# Patient Record
Sex: Female | Born: 1990
Health system: Southern US, Community
[De-identification: ages and names within clinical notes are randomized; demographics above are authoritative.]

## PROBLEM LIST (undated history)

## (undated) ENCOUNTER — Inpatient Hospital Stay (HOSPITAL_COMMUNITY): Payer: Medicaid Other

## (undated) DIAGNOSIS — R519 Headache, unspecified: Secondary | ICD-10-CM

## (undated) DIAGNOSIS — K219 Gastro-esophageal reflux disease without esophagitis: Secondary | ICD-10-CM

## (undated) DIAGNOSIS — F329 Major depressive disorder, single episode, unspecified: Secondary | ICD-10-CM

## (undated) DIAGNOSIS — F32A Depression, unspecified: Secondary | ICD-10-CM

## (undated) DIAGNOSIS — Z5189 Encounter for other specified aftercare: Secondary | ICD-10-CM

## (undated) DIAGNOSIS — F419 Anxiety disorder, unspecified: Secondary | ICD-10-CM

## (undated) DIAGNOSIS — R87629 Unspecified abnormal cytological findings in specimens from vagina: Secondary | ICD-10-CM

## (undated) DIAGNOSIS — D649 Anemia, unspecified: Secondary | ICD-10-CM

## (undated) DIAGNOSIS — O139 Gestational [pregnancy-induced] hypertension without significant proteinuria, unspecified trimester: Secondary | ICD-10-CM

## (undated) HISTORY — DX: Depression, unspecified: F32.A

## (undated) HISTORY — DX: Unspecified abnormal cytological findings in specimens from vagina: R87.629

## (undated) HISTORY — DX: Anxiety disorder, unspecified: F41.9

## (undated) HISTORY — PX: TONSILLECTOMY: SUR1361

## (undated) HISTORY — DX: Headache, unspecified: R51.9

## (undated) HISTORY — DX: Encounter for other specified aftercare: Z51.89

## (undated) HISTORY — DX: Gestational (pregnancy-induced) hypertension without significant proteinuria, unspecified trimester: O13.9

---
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
## 2010-03-22 ENCOUNTER — Emergency Department (HOSPITAL_COMMUNITY): Admission: EM | Admit: 2010-03-22 | Discharge: 2010-03-22 | Payer: Self-pay | Admitting: Emergency Medicine

## 2010-04-23 ENCOUNTER — Emergency Department (HOSPITAL_COMMUNITY): Admission: EM | Admit: 2010-04-23 | Discharge: 2010-04-23 | Payer: Self-pay | Admitting: Emergency Medicine

## 2010-09-16 LAB — URINALYSIS, ROUTINE W REFLEX MICROSCOPIC
Bilirubin Urine: NEGATIVE
Glucose, UA: NEGATIVE mg/dL
Hgb urine dipstick: NEGATIVE
Ketones, ur: NEGATIVE mg/dL
Protein, ur: NEGATIVE mg/dL
Specific Gravity, Urine: 1.019 (ref 1.005–1.030)
Urobilinogen, UA: 1 mg/dL (ref 0.0–1.0)

## 2010-09-16 LAB — WET PREP, GENITAL
Trich, Wet Prep: NONE SEEN
Yeast Wet Prep HPF POC: NONE SEEN

## 2010-09-16 LAB — URINE MICROSCOPIC-ADD ON

## 2010-09-22 LAB — POCT I-STAT, CHEM 8
Creatinine, Ser: 0.5 mg/dL (ref 0.4–1.2)
Glucose, Bld: 87 mg/dL (ref 70–99)
HCT: 33 % — ABNORMAL LOW (ref 36.0–46.0)
Hemoglobin: 11.2 g/dL — ABNORMAL LOW (ref 12.0–15.0)
Potassium: 4 mEq/L (ref 3.5–5.1)
TCO2: 26 mmol/L (ref 0–100)

## 2010-09-22 LAB — URINALYSIS, ROUTINE W REFLEX MICROSCOPIC
Bilirubin Urine: NEGATIVE
Nitrite: NEGATIVE
Urobilinogen, UA: 1 mg/dL (ref 0.0–1.0)

## 2010-11-19 NOTE — Op Note (Signed)
Carmen Price, Carmen Price                        ACCOUNT NO.:  [id number]   MEDICAL RECORD NO.:  [id number]                   PATIENT TYPE:  AMB   LOCATION:  DSC                                  FACILITY:  [facility]   PHYSICIAN:  Kinnie Scales. Annalee Genta, M.D.            DATE OF BIRTH:  11-15-1990   DATE OF PROCEDURE:  DATE OF DISCHARGE:                                 OPERATIVE REPORT   PREOPERATIVE DIAGNOSIS:  1. Recurrent acute tonsillitis.  2. Adenotonsillar hypertrophy.   POSTOPERATIVE DIAGNOSIS:  1. Recurrent acute tonsillitis.  2. Adenotonsillar hypertrophy.   PROCEDURE:  Tonsillectomy and adenoidectomy (adenoid ablation).   SURGEON:  Kinnie Scales. Annalee Genta, M.D.   ANESTHESIA:  General endotracheal.   COMPLICATIONS:  None.   ESTIMATED BLOOD LOSS:  Minimal.   DISPOSITION:  The patient was transferred from the operating room to the  recovery room in stable condition.   INDICATIONS:  The patient is an 20 year old black female who is referred by  her pediatrician, Dr. Vaughan Basta, at [facility], for evaluation of  recurrent tonsillitis and adenotonsillar hypertrophy.  The patient had a  history of recurrent infections with multiple courses of antibiotics and  significant adenotonsillar hypertrophy with snoring and possible obstructive  sleep apnea.  Given the patient's history and examination which showed  significant airway obstruction from tonsillar hypertrophy, I recommend that  we consider her for tonsillectomy and adenoidectomy under general  anesthesia.  The risks, benefits and possible complications of the procedure  were discussed in detail with the patient's mother who understood and  concurred with our plan for surgery, which was scheduled for March 17, 2003.   DESCRIPTION OF PROCEDURE:  The patient was brought to the operating room on  March 17, 2003, and placed in the supine position on the operating  table.  General endotracheal anesthesia was established  without difficulty.  When the patient was adequately anesthetized, a Crowe-Davis mouth gag was  inserted without difficulty.  The oral cavity and oropharynx were examined.  There were no loose or broken teeth and the hard and soft palate were  intact.  The patient's posterior nasopharynx was examined.  She was found to  have moderately enlarged adenoids and adenoid ablation was undertaken using  Bovie electrocautery.  With the adenoid tissue ablated, the nasopharynx was  patent.  Attention was then turned to the patient's tonsils.  Again, on the  left hand side using the harmonic scalpel and dissecting in a subcapsular  fashion, the entire left tonsil was removed from the superior pole of the  tongue base.  The right tonsil was removed in a similar fashion, and  tonsillar tissue was sent to pathology for gross and microscopic evaluation.  Tonsillar fossa was gently abraded with a dry tonsil sponge.  There were  several areas of point hemorrhage which were cauterized using suction  cautery.  The Crowe-Davis mouth gag was released and reapplied.  There  was  no active bleeding.  The patient's oral cavity, oral pharynx and nasal  cavity and nasopharynx were then irrigated with saline and suctioned.  An  oral gastric tube was passed and the stomach contents were aspirated.  The  patient was then awakened from her anesthetic.  The Crowe-Davis mouth gag  was released and removed, and there were no loose or broken teeth and no  active bleeding.  The patient was then extubated and transferred from the  operating room to the recovery room in stable condition.                                               Kinnie Scales. Annalee Genta, M.D.    DLS/MEDQ  D:  81/19/1478  T:  03/17/2003  Job:  [id number]

## 2011-02-15 ENCOUNTER — Emergency Department (HOSPITAL_COMMUNITY)
Admission: EM | Admit: 2011-02-15 | Discharge: 2011-02-16 | Disposition: A | Discharge status: Home / Self Care | Payer: Medicaid Other | Attending: Emergency Medicine | Admitting: Emergency Medicine

## 2011-02-15 DIAGNOSIS — R112 Nausea with vomiting, unspecified: Secondary | ICD-10-CM | POA: Insufficient documentation

## 2011-02-15 DIAGNOSIS — IMO0001 Reserved for inherently not codable concepts without codable children: Secondary | ICD-10-CM | POA: Insufficient documentation

## 2011-02-15 DIAGNOSIS — B9789 Other viral agents as the cause of diseases classified elsewhere: Secondary | ICD-10-CM | POA: Insufficient documentation

## 2011-02-15 DIAGNOSIS — R000 Tachycardia, unspecified: Secondary | ICD-10-CM | POA: Insufficient documentation

## 2011-02-16 LAB — DIFFERENTIAL
Basophils Relative: 0 % (ref 0–1)
Eosinophils Relative: 1 % (ref 0–5)
Lymphs Abs: 1 10*3/uL (ref 0.7–4.0)
Monocytes Relative: 6 % (ref 3–12)
Neutro Abs: 6.6 10*3/uL (ref 1.7–7.7)

## 2011-02-16 LAB — COMPREHENSIVE METABOLIC PANEL
ALT: 13 U/L (ref 0–35)
Albumin: 4 g/dL (ref 3.5–5.2)
Alkaline Phosphatase: 64 U/L (ref 39–117)
Calcium: 9.2 mg/dL (ref 8.4–10.5)
Glucose, Bld: 102 mg/dL — ABNORMAL HIGH (ref 70–99)
Sodium: 139 mEq/L (ref 135–145)
Total Protein: 7.8 g/dL (ref 6.0–8.3)

## 2011-02-16 LAB — URINALYSIS, ROUTINE W REFLEX MICROSCOPIC
Glucose, UA: NEGATIVE mg/dL
Urobilinogen, UA: 1 mg/dL (ref 0.0–1.0)
pH: 7.5 (ref 5.0–8.0)

## 2011-02-16 LAB — LIPASE, BLOOD: Lipase: 17 U/L (ref 11–59)

## 2011-02-16 LAB — CBC
Hemoglobin: 13.1 g/dL (ref 12.0–15.0)
MCHC: 34.8 g/dL (ref 30.0–36.0)
RBC: 4.23 MIL/uL (ref 3.87–5.11)

## 2011-03-29 LAB — URINALYSIS, ROUTINE W REFLEX MICROSCOPIC
Glucose, UA: NEGATIVE
Ketones, ur: 15 — AB
Leukocytes, UA: NEGATIVE
Protein, ur: >300 — AB
Specific Gravity, Urine: 1.03
Urobilinogen, UA: 0.2
pH: 5

## 2011-03-29 LAB — URINE MICROSCOPIC-ADD ON

## 2011-04-05 ENCOUNTER — Other Ambulatory Visit: Payer: Self-pay | Admitting: Family Medicine

## 2011-04-05 ENCOUNTER — Other Ambulatory Visit (HOSPITAL_COMMUNITY)
Admission: RE | Admit: 2011-04-05 | Discharge: 2011-04-05 | Disposition: A | Discharge status: Home / Self Care | Payer: Medicaid Other | Source: Ambulatory Visit | Attending: Family Medicine | Admitting: Family Medicine

## 2011-04-05 DIAGNOSIS — Z01419 Encounter for gynecological examination (general) (routine) without abnormal findings: Secondary | ICD-10-CM | POA: Insufficient documentation

## 2011-04-05 DIAGNOSIS — R87810 Cervical high risk human papillomavirus (HPV) DNA test positive: Secondary | ICD-10-CM | POA: Insufficient documentation

## 2011-08-20 ENCOUNTER — Inpatient Hospital Stay (HOSPITAL_COMMUNITY)
Admission: AD | Admit: 2011-08-20 | Discharge: 2011-08-20 | Disposition: A | Discharge status: Home / Self Care | Payer: Medicaid Other | Source: Ambulatory Visit | Attending: Obstetrics & Gynecology | Admitting: Obstetrics & Gynecology

## 2011-08-20 DIAGNOSIS — Z3202 Encounter for pregnancy test, result negative: Secondary | ICD-10-CM

## 2011-08-20 DIAGNOSIS — R110 Nausea: Secondary | ICD-10-CM | POA: Insufficient documentation

## 2011-08-20 NOTE — ED Provider Notes (Signed)
Attestation of Attending Supervision of Advanced Practitioner: Evaluation and management procedures were performed by the PA/NP/CNM/OB Fellow under my supervision/collaboration. Chart reviewed, and agree with management and plan.  Jaynie Collins, M.D. 08/20/2011 10:42 AM

## 2011-08-20 NOTE — ED Notes (Signed)
T.Burleson,NP notified pt pregnancy test is negative but it has not been enough time inbetween periods to know for sure. Recommended to test in a few more weeks if she misses her period.

## 2011-08-20 NOTE — Progress Notes (Signed)
Has been nauseated past few days wanted to check to see if she was pregnant.

## 2011-08-20 NOTE — ED Provider Notes (Signed)
History     No chief complaint on file.  HPI [name] 21 y.o. LMP 08-08-11, having some nausea and came for pregnancy test.  Uses condoms with sex and is unsure if any have broken.  Denies having any other problems today.  Does not want an exam.  Simply wants a pregnancy test.   OB History    No data available      No past medical history on file.  No past surgical history on file.  No family history on file.  History  Substance Use Topics  . Smoking status: Not on file  . Smokeless tobacco: Not on file  . Alcohol Use: Not on file    Allergies: No Known Allergies  No prescriptions prior to admission    ROS Physical Exam   Last menstrual period 08/08/2011.  Physical Exam  Nursing note and vitals reviewed. Constitutional: She is oriented to person, place, and time. She appears well-developed and well-nourished.  HENT:  Head: Normocephalic.  Eyes: EOM are normal.  Neck: Neck supple.  Musculoskeletal: Normal range of motion.  Neurological: She is alert and oriented to person, place, and time.  Skin: Skin is warm and dry.  Psychiatric: She has a normal mood and affect.    MAU Course  Procedures  MDM Results for orders placed during the hospital encounter of 08/20/11 (from the past 24 hour(s))  POCT PREGNANCY, URINE     Status: Normal   Collection Time   08/20/11 10:08 AM      Component Value Range   Preg Test, Ur NEGATIVE  NEGATIVE     Assessment and Plan  Negative pregnancy test  Plan Continue condoms If period is late, repeat pregnancy test. [name] 08/20/2011, 10:26 AM   [name], NP 08/20/11 1029

## 2012-07-01 ENCOUNTER — Emergency Department (HOSPITAL_COMMUNITY)
Admission: EM | Admit: 2012-07-01 | Discharge: 2012-07-01 | Disposition: A | Discharge status: Home / Self Care | Payer: Self-pay | Attending: Emergency Medicine | Admitting: Emergency Medicine

## 2012-07-01 ENCOUNTER — Encounter (HOSPITAL_COMMUNITY): Payer: Self-pay | Admitting: Family Medicine

## 2012-07-01 DIAGNOSIS — R5381 Other malaise: Secondary | ICD-10-CM | POA: Insufficient documentation

## 2012-07-01 DIAGNOSIS — R05 Cough: Secondary | ICD-10-CM | POA: Insufficient documentation

## 2012-07-01 DIAGNOSIS — R112 Nausea with vomiting, unspecified: Secondary | ICD-10-CM | POA: Insufficient documentation

## 2012-07-01 DIAGNOSIS — R630 Anorexia: Secondary | ICD-10-CM | POA: Insufficient documentation

## 2012-07-01 DIAGNOSIS — B9789 Other viral agents as the cause of diseases classified elsewhere: Secondary | ICD-10-CM | POA: Insufficient documentation

## 2012-07-01 DIAGNOSIS — Z3202 Encounter for pregnancy test, result negative: Secondary | ICD-10-CM | POA: Insufficient documentation

## 2012-07-01 DIAGNOSIS — F17200 Nicotine dependence, unspecified, uncomplicated: Secondary | ICD-10-CM | POA: Insufficient documentation

## 2012-07-01 DIAGNOSIS — R6883 Chills (without fever): Secondary | ICD-10-CM | POA: Insufficient documentation

## 2012-07-01 DIAGNOSIS — B349 Viral infection, unspecified: Secondary | ICD-10-CM

## 2012-07-01 DIAGNOSIS — R1110 Vomiting, unspecified: Secondary | ICD-10-CM

## 2012-07-01 DIAGNOSIS — H109 Unspecified conjunctivitis: Secondary | ICD-10-CM | POA: Insufficient documentation

## 2012-07-01 DIAGNOSIS — R059 Cough, unspecified: Secondary | ICD-10-CM | POA: Insufficient documentation

## 2012-07-01 DIAGNOSIS — R0982 Postnasal drip: Secondary | ICD-10-CM | POA: Insufficient documentation

## 2012-07-01 LAB — POCT PREGNANCY, URINE: Preg Test, Ur: NEGATIVE

## 2012-07-01 LAB — URINE MICROSCOPIC-ADD ON

## 2012-07-01 LAB — URINALYSIS, ROUTINE W REFLEX MICROSCOPIC
Glucose, UA: NEGATIVE mg/dL
Hgb urine dipstick: NEGATIVE
Specific Gravity, Urine: 1.021 (ref 1.005–1.030)
Urobilinogen, UA: 1 mg/dL (ref 0.0–1.0)

## 2012-07-01 MED ORDER — PROMETHAZINE HCL 25 MG PO TABS: 25.0000 mg | ORAL_TABLET | Freq: Four times a day (QID) | ORAL | Status: DC | PRN

## 2012-07-01 MED ORDER — ONDANSETRON 4 MG PO TBDP: 4.0000 mg | ORAL_TABLET | Freq: Three times a day (TID) | ORAL | Status: DC | PRN

## 2012-07-01 MED ORDER — ONDANSETRON 4 MG PO TBDP
4.0000 mg | ORAL_TABLET | Freq: Once | ORAL | Status: AC
  Administered 2012-07-01: 4 mg via ORAL
  Filled 2012-07-01: qty 1

## 2012-07-01 NOTE — ED Notes (Signed)
Per pt sts she vomited twice this am and she thinks she has pink eye. Denies abdominal pain. sts some nausea.

## 2012-07-01 NOTE — ED Provider Notes (Signed)
History     CSN: [id number]  Arrival date & time 07/01/12  1424   First MD Initiated Contact with Patient 07/01/12 1700      Chief Complaint  Patient presents with  . Vomiting  . Conjunctivitis    (Consider location/radiation/quality/duration/timing/severity/associated sxs/prior treatment) HPI Comments: Ms. Deschene presents ambulatory for evaluation of a nausea and vomiting.  She awoke this morning and vomited once.  She reports the emesis appeared to be the same as the meal she ate prior to going to bed.  While in the waiting room, she drank water and vomited again.  She denies any pain but reports that she has had a cough and runny nose for a bout 1 week.  She states she awoke with red eyes and mucous like crusting similar to her nasal discharge this morning and yesterday.  She is concerned she may have "pink eye".  She denies any exposure to conjunctivitis, eye itching, or visual changes.  She also denies sore throat or any productivity to the cough.  Patient is a 21 y.o. female presenting with vomiting. The history is provided by the patient. No language interpreter was used.  Emesis  This is a new problem. The current episode started 6 to 12 hours ago. Episode frequency: 2 times total. The problem has been gradually improving. The emesis has an appearance of stomach contents. There has been no fever. Associated symptoms include chills, cough, sweats and URI. Pertinent negatives include no abdominal pain, no arthralgias, no diarrhea, no fever, no headaches and no myalgias.    History reviewed. No pertinent past medical history.  History reviewed. No pertinent past surgical history.  History reviewed. No pertinent family history.  History  Substance Use Topics  . Smoking status: Current Every Day Smoker  . Smokeless tobacco: Not on file  . Alcohol Use: Yes    OB History    Grav Para Term Preterm Abortions TAB SAB Ect Mult Living                  Review of Systems    Constitutional: Positive for chills, appetite change and fatigue. Negative for fever, diaphoresis and activity change.  HENT: Positive for postnasal drip. Negative for nosebleeds, trouble swallowing, neck stiffness and sinus pressure.   Respiratory: Positive for cough. Negative for chest tightness and shortness of breath.   Cardiovascular: Negative for chest pain, palpitations and leg swelling.  Gastrointestinal: Positive for vomiting. Negative for abdominal pain and diarrhea.  Genitourinary: Negative for dysuria, urgency, frequency, hematuria, flank pain, vaginal discharge, difficulty urinating and pelvic pain.  Musculoskeletal: Negative for myalgias and arthralgias.  Neurological: Negative for syncope, light-headedness and headaches.  Hematological: Negative.     Allergies  Review of patient's allergies indicates no known allergies.  Home Medications  No current outpatient prescriptions on file.  BP 138/63  Pulse 106  Temp 99.2 F (37.3 C) (Oral)  Resp 18  SpO2 97%  LMP 06/09/2012  Physical Exam  Nursing note and vitals reviewed. Constitutional: She is oriented to person, place, and time. She appears well-developed and well-nourished. No distress. She is not intubated.  HENT:  Head: Normocephalic and atraumatic. Head is without raccoon's eyes, without Battle's sign, without contusion, without laceration, without right periorbital erythema and without left periorbital erythema. Hair is normal. No trismus in the jaw.  Right Ear: Hearing, tympanic membrane and ear canal normal. No drainage, swelling or tenderness. No mastoid tenderness. Tympanic membrane is not injected and not erythematous. No hemotympanum.  Left  Ear: Hearing, tympanic membrane, external ear and ear canal normal. No drainage, swelling or tenderness. No mastoid tenderness. Tympanic membrane is not injected and not erythematous. No hemotympanum.  Nose: Mucosal edema and rhinorrhea present. No nose lacerations, sinus  tenderness or nasal deformity. No epistaxis.  No foreign bodies. Right sinus exhibits no maxillary sinus tenderness and no frontal sinus tenderness. Left sinus exhibits no maxillary sinus tenderness and no frontal sinus tenderness.  Mouth/Throat: Uvula is midline, oropharynx is clear and moist and mucous membranes are normal. Mucous membranes are not pale, not dry and not cyanotic. No oral lesions. Dental caries present. No uvula swelling or lacerations. No oropharyngeal exudate, posterior oropharyngeal edema, posterior oropharyngeal erythema or tonsillar abscesses.  Eyes: Conjunctivae normal, EOM and lids are normal. Pupils are equal, round, and reactive to light. Right eye exhibits no chemosis, no discharge, no exudate and no hordeolum. Left eye exhibits no chemosis, no discharge, no exudate and no hordeolum. Right conjunctiva is not injected. Right conjunctiva has no hemorrhage. Left conjunctiva is not injected. Left conjunctiva has no hemorrhage. No scleral icterus. Right eye exhibits no nystagmus. Left eye exhibits normal extraocular motion and no nystagmus. Right pupil is round and reactive. Left pupil is round and reactive. Pupils are equal.  Neck: Trachea normal, normal range of motion, full passive range of motion without pain and phonation normal. Neck supple. No JVD present. No tracheal tenderness, no spinous process tenderness and no muscular tenderness present. Carotid bruit is not present. No rigidity. No tracheal deviation, no edema, no erythema and normal range of motion present.  Cardiovascular: Normal rate, regular rhythm, S1 normal, S2 normal, normal heart sounds and intact distal pulses.   No extrasystoles are present. PMI is not displaced.  Exam reveals no gallop, no distant heart sounds, no friction rub and no decreased pulses.   No murmur heard. Pulmonary/Chest: Effort normal and breath sounds normal. No accessory muscle usage or stridor. No apnea, not tachypneic and not bradypneic. She  is not intubated. No respiratory distress. She has no decreased breath sounds. She has no wheezes. She has no rhonchi. She has no rales. She exhibits no tenderness, no bony tenderness, no crepitus, no edema and no retraction.  Abdominal: Soft. Normal appearance and bowel sounds are normal. She exhibits shifting dullness. She exhibits no distension, no ascites, no pulsatile midline mass and no mass. There is no hepatosplenomegaly. There is no tenderness. There is no rigidity, no rebound, no guarding, no CVA tenderness, no tenderness at McBurney's point and negative Murphy's sign. No hernia.  Musculoskeletal: Normal range of motion. She exhibits no edema and no tenderness.  Lymphadenopathy:    She has no cervical adenopathy.  Neurological: She is oriented to person, place, and time. No cranial nerve deficit.  Skin: Skin is warm and dry. No rash noted. She is not diaphoretic. No erythema.  Psychiatric: She has a normal mood and affect. Her behavior is normal.    ED Course  Procedures (including critical care time)  Labs Reviewed  URINALYSIS, ROUTINE W REFLEX MICROSCOPIC - Abnormal; Notable for the following:    Leukocytes, UA SMALL (*)     All other components within normal limits  URINE MICROSCOPIC-ADD ON - Abnormal; Notable for the following:    Squamous Epithelial / LPF MANY (*)     Bacteria, UA FEW (*)     All other components within normal limits  POCT PREGNANCY, URINE  URINE CULTURE   No results found.   No diagnosis found.  MDM  Pt presents for evaluation of nausea and vomiting.  She appears comfortable and nontoxic, note mildly elevated HR on triage VS, NAD.  She was concerned that she may have conjunctivitis however she has clear sclera/conjunctiva without discharge.  Urinalysis performed in triage is not consistent with a UTI and the urine pregnancy test is negative.  Her abdomen is soft with normal vital signs.  Suspect the vomiting may be secondary to a viral process as she  also has rhinorrhea, chills, anda nonproductive cough.  Will administer zofran an attempt a po challenge.  If she remains stable, will eventually discharge home with symptomatic care. 1915.  Pt stable, NAD.  She has tolerated po clears.  Plan discharge home.  Will prescribe zofran and phenergan to be taken as needed for nausea.     Tobin Chad, MD 07/01/12 [phone]

## 2012-07-03 LAB — URINE CULTURE

## 2012-07-04 NOTE — ED Notes (Signed)
+   Urine Chart sent to EDP office for review. 

## 2012-08-07 ENCOUNTER — Inpatient Hospital Stay (HOSPITAL_COMMUNITY)
Admission: AD | Admit: 2012-08-07 | Discharge: 2012-08-07 | Disposition: A | Discharge status: Home / Self Care | Payer: Medicaid Other | Source: Ambulatory Visit | Attending: Family Medicine | Admitting: Family Medicine

## 2012-08-07 DIAGNOSIS — Z3202 Encounter for pregnancy test, result negative: Secondary | ICD-10-CM | POA: Insufficient documentation

## 2012-08-07 DIAGNOSIS — N912 Amenorrhea, unspecified: Secondary | ICD-10-CM | POA: Insufficient documentation

## 2012-08-07 DIAGNOSIS — N926 Irregular menstruation, unspecified: Secondary | ICD-10-CM | POA: Insufficient documentation

## 2012-08-07 LAB — POCT PREGNANCY, URINE: Preg Test, Ur: NEGATIVE

## 2012-08-07 NOTE — MAU Provider Note (Signed)
  History     CSN: [id number]  Arrival date and time: 08/07/12 1422   First Provider Initiated Contact with Patient 08/07/12 1552      Chief Complaint  Patient presents with  . Possible Pregnancy   HPI [name] is a 22 y.o. female who presents to MAU today for a pregnancy test. The patient states LMP was 06/03/12. She denies any vaginal bleeding, abnormal discharge, abdominal pain, fever, N/V. She has a history of irregular periods.   OB History    Grav Para Term Preterm Abortions TAB SAB Ect Mult Living                  No past medical history on file.  No past surgical history on file.  No family history on file.  History  Substance Use Topics  . Smoking status: Current Every Day Smoker  . Smokeless tobacco: Not on file  . Alcohol Use: Yes    Allergies: No Known Allergies  Prescriptions prior to admission  Medication Sig Dispense Refill  . ondansetron (ZOFRAN ODT) 4 MG disintegrating tablet Take 1 tablet (4 mg total) by mouth every 8 (eight) hours as needed for nausea.  10 tablet  0  . promethazine (PHENERGAN) 25 MG tablet Take 1 tablet (25 mg total) by mouth every 6 (six) hours as needed for nausea.  12 tablet  0    Review of Systems  Constitutional: Negative for fever and chills.  Gastrointestinal: Negative for nausea, vomiting, abdominal pain and diarrhea.  Genitourinary:       Denies vaginal bleeding or abnormal discharge    Physical Exam   Blood pressure 118/67, pulse 71, temperature 98 F (36.7 C), temperature source Oral, resp. rate 16, height 5\' 6"  (1.676 m), weight 259 lb 8 oz (117.708 kg), last menstrual period 06/03/2012.  Physical Exam  Constitutional: She is oriented to person, place, and time. She appears well-developed and well-nourished. No distress.  HENT:  Head: Normocephalic.  Cardiovascular: Normal rate.   Respiratory: Effort normal.  Neurological: She is alert and oriented to person, place, and time.  Skin: Skin is warm and  dry. No erythema.  Psychiatric: She has a normal mood and affect.   Results for orders placed during the hospital encounter of 08/07/12 (from the past 24 hour(s))  POCT PREGNANCY, URINE     Status: Normal   Collection Time   08/07/12  3:16 PM      Component Value Range   Preg Test, Ur NEGATIVE  NEGATIVE    MAU Course  Procedures None  Assessment and Plan  A: Amenorrhea vs. Irregular menses  P: Discharge home Recommend patient take HPT in ~ 1 week if still not had a cycle Patient may return to MAU as needed or follow-up with a PCP or GYN of choice for further evaluation of irregular menses  Freddi Starr, PA-C 08/07/2012, 3:54 PM

## 2012-08-07 NOTE — MAU Provider Note (Signed)
Chart reviewed and agree with management and plan.  

## 2012-08-07 NOTE — MAU Note (Signed)
Pt here for pregnancy test only, denies pain or bleeding.

## 2015-02-03 ENCOUNTER — Encounter (HOSPITAL_COMMUNITY): Payer: Self-pay | Admitting: *Deleted

## 2015-02-03 ENCOUNTER — Inpatient Hospital Stay (HOSPITAL_COMMUNITY)
Admission: AD | Admit: 2015-02-03 | Discharge: 2015-02-04 | DRG: 760 | Disposition: A | Discharge status: Home / Self Care | Payer: Self-pay | Source: Ambulatory Visit | Attending: Family Medicine | Admitting: Family Medicine

## 2015-02-03 ENCOUNTER — Inpatient Hospital Stay (HOSPITAL_COMMUNITY): Payer: Medicaid Other

## 2015-02-03 DIAGNOSIS — Z8249 Family history of ischemic heart disease and other diseases of the circulatory system: Secondary | ICD-10-CM

## 2015-02-03 DIAGNOSIS — K219 Gastro-esophageal reflux disease without esophagitis: Secondary | ICD-10-CM | POA: Diagnosis present

## 2015-02-03 DIAGNOSIS — D649 Anemia, unspecified: Secondary | ICD-10-CM | POA: Diagnosis present

## 2015-02-03 DIAGNOSIS — N939 Abnormal uterine and vaginal bleeding, unspecified: Secondary | ICD-10-CM

## 2015-02-03 DIAGNOSIS — N390 Urinary tract infection, site not specified: Secondary | ICD-10-CM | POA: Diagnosis present

## 2015-02-03 DIAGNOSIS — R110 Nausea: Secondary | ICD-10-CM | POA: Diagnosis present

## 2015-02-03 DIAGNOSIS — IMO0001 Reserved for inherently not codable concepts without codable children: Secondary | ICD-10-CM

## 2015-02-03 DIAGNOSIS — N921 Excessive and frequent menstruation with irregular cycle: Principal | ICD-10-CM | POA: Diagnosis present

## 2015-02-03 DIAGNOSIS — Z833 Family history of diabetes mellitus: Secondary | ICD-10-CM

## 2015-02-03 DIAGNOSIS — F17210 Nicotine dependence, cigarettes, uncomplicated: Secondary | ICD-10-CM | POA: Diagnosis present

## 2015-02-03 DIAGNOSIS — N926 Irregular menstruation, unspecified: Secondary | ICD-10-CM

## 2015-02-03 HISTORY — DX: Gastro-esophageal reflux disease without esophagitis: K21.9

## 2015-02-03 HISTORY — DX: Anemia, unspecified: D64.9

## 2015-02-03 HISTORY — DX: Reserved for inherently not codable concepts without codable children: IMO0001

## 2015-02-03 LAB — URINALYSIS, ROUTINE W REFLEX MICROSCOPIC
BILIRUBIN URINE: NEGATIVE
Glucose, UA: NEGATIVE mg/dL
KETONES UR: 15 mg/dL — AB
NITRITE: POSITIVE — AB
PROTEIN: 30 mg/dL — AB
SPECIFIC GRAVITY, URINE: 1.025 (ref 1.005–1.030)
UROBILINOGEN UA: 0.2 mg/dL (ref 0.0–1.0)
pH: 6 (ref 5.0–8.0)

## 2015-02-03 LAB — ABO/RH: ABO/RH(D): O POS

## 2015-02-03 LAB — URINE MICROSCOPIC-ADD ON

## 2015-02-03 LAB — POCT PREGNANCY, URINE: Preg Test, Ur: NEGATIVE

## 2015-02-03 LAB — PLATELET FUNCTION ASSAY: Collagen / Epinephrine: 77 seconds (ref 0–193)

## 2015-02-03 LAB — APTT: APTT: 29 s (ref 24–37)

## 2015-02-03 LAB — FIBRINOGEN: Fibrinogen: 414 mg/dL (ref 204–475)

## 2015-02-03 LAB — PREPARE RBC (CROSSMATCH)

## 2015-02-03 LAB — PROTIME-INR
INR: 1.29 (ref 0.00–1.49)
Prothrombin Time: 16.2 seconds — ABNORMAL HIGH (ref 11.6–15.2)

## 2015-02-03 MED ORDER — SODIUM CHLORIDE 0.9 % IV SOLN
Freq: Once | INTRAVENOUS | Status: AC
  Administered 2015-02-03: 15:00:00 via INTRAVENOUS

## 2015-02-03 MED ORDER — METRONIDAZOLE 500 MG PO TABS
2000.0000 mg | ORAL_TABLET | Freq: Once | ORAL | Status: AC
  Administered 2015-02-03: 2000 mg via ORAL
  Filled 2015-02-03: qty 4

## 2015-02-03 MED ORDER — MEGESTROL ACETATE 40 MG PO TABS
40.0000 mg | ORAL_TABLET | Freq: Two times a day (BID) | ORAL | Status: DC
  Administered 2015-02-03 – 2015-02-04 (×3): 40 mg via ORAL
  Filled 2015-02-03 (×5): qty 1

## 2015-02-03 MED ORDER — SODIUM CHLORIDE 0.9 % IV SOLN: Freq: Once | INTRAVENOUS | Status: DC

## 2015-02-03 MED ORDER — ACETAMINOPHEN 325 MG PO TABS: 650.0000 mg | ORAL_TABLET | Freq: Four times a day (QID) | ORAL | Status: DC | PRN

## 2015-02-03 MED ORDER — ONDANSETRON 4 MG PO TBDP
4.0000 mg | ORAL_TABLET | Freq: Once | ORAL | Status: AC
  Administered 2015-02-03: 4 mg via ORAL
  Filled 2015-02-03: qty 1

## 2015-02-03 MED ORDER — SODIUM CHLORIDE 0.9 % IV SOLN
Freq: Once | INTRAVENOUS | Status: AC
  Administered 2015-02-03: 50 mL/h via INTRAVENOUS

## 2015-02-03 NOTE — MAU Note (Addendum)
C/o nausea for past 2 weeks; hx irregular periods; had one period 5-8 16 thru 7 20-16; then started bleeding again 01-31-2015; uses condoms for [facility];

## 2015-02-03 NOTE — H&P (Signed)
CSN: [id number]  Arrival date and time: 02/03/15 1122   First Provider Initiated Contact with Patient 02/03/15 1213      Chief Complaint  Patient presents with  . Nausea  . Metrorrhagia   HPI    [name] is a 23 y.o. female G0P0000 presenting today with nausea; this is her main complaint.  The nausea started today; she has had off and on nausea in the past. She had never taken anything over the counter for the nausea. She has been to MAU in the past for nausea; she, at the time thought she was pregnant.   This morning she ate a friend bologna sandwhich and she vomited. She tried eating salt and drank water and this all came up as well.  Last night for dinner she had chips and did not feel well after eating this.   She has a history of GERD; [facility] and [facility] were [facility] her GERD. This was a diagnoses from middle school. She has had not treatment for GERD in several years. She feels the nausea/ vomiting may be related to this.   She is currently on her menstrual cycle; she started this cycle on July 30th; she describes the bleeding as heavy. In the last 24 hours she has changed 6-7 pads and has recently had to double her pads.  From May 8th- July 20 she bled everyday.   She has a history of irregular menstrual cycles; no PCOS history. No birth control currently. No history of fibroids at this time.    She denies pain at this time.    OB History    Gravida Para Term Preterm AB TAB SAB Ectopic Multiple Living   0 0 0 0 0 0 0 0 0 0       Past Medical History  Diagnosis Date  . Anemia   . GERD (gastroesophageal reflux disease)     Past Surgical History  Procedure Laterality Date  . Tonsillectomy      Family History  Problem Relation Age of Onset  . Hypertension Mother   . Diabetes Father   . Diabetes Maternal Grandmother     History  Substance Use Topics  . Smoking status: Current Every Day Smoker -- 0.50 packs/day  . Smokeless  tobacco: Not on file  . Alcohol Use: Yes    Allergies: No Known Allergies  Prescriptions prior to admission  Medication Sig Dispense Refill Last Dose  . [DISCONTINUED] ondansetron (ZOFRAN ODT) 4 MG disintegrating tablet Take 1 tablet (4 mg total) by mouth every 8 (eight) hours as needed for nausea. 10 tablet 0   . [DISCONTINUED] promethazine (PHENERGAN) 25 MG tablet Take 1 tablet (25 mg total) by mouth every 6 (six) hours as needed for nausea. 12 tablet 0    Results for orders placed or performed during the hospital encounter of 02/03/15 (from the past 48 hour(s))  Urinalysis, Routine w reflex microscopic (not at [facility])     Status: Abnormal   Collection Time: 02/03/15 11:44 AM  Result Value Ref Range   Color, Urine YELLOW YELLOW   APPearance HAZY (A) CLEAR   Specific Gravity, Urine 1.025 1.005 - 1.030   pH 6.0 5.0 - 8.0   Glucose, UA NEGATIVE NEGATIVE mg/dL   Hgb urine dipstick LARGE (A) NEGATIVE   Bilirubin Urine NEGATIVE NEGATIVE   Ketones, ur 15 (A) NEGATIVE mg/dL   Protein, ur 30 (A) NEGATIVE mg/dL   Urobilinogen, UA 0.2 0.0 - 1.0  mg/dL   Nitrite POSITIVE (A) NEGATIVE   Leukocytes, UA SMALL (A) NEGATIVE  Urine microscopic-add on     Status: Abnormal   Collection Time: 02/03/15 11:44 AM  Result Value Ref Range   Squamous Epithelial / LPF MANY (A) RARE   WBC, UA 11-20 <3 WBC/hpf   RBC / HPF 21-50 <3 RBC/hpf   Bacteria, UA MANY (A) RARE   Urine-Other MUCOUS PRESENT     Comment: TRICHOMONAS PRESENT  Pregnancy, urine POC     Status: None   Collection Time: 02/03/15 11:46 AM  Result Value Ref Range   Preg Test, Ur NEGATIVE NEGATIVE    Comment:        THE SENSITIVITY OF THIS METHODOLOGY IS >24 mIU/mL   CBC     Status: Abnormal   Collection Time: 02/03/15  1:00 PM  Result Value Ref Range   WBC 5.2 4.0 - 10.5 K/uL   RBC 2.18 (L) 3.87 - 5.11 MIL/uL   Hemoglobin 4.1 (LL) 12.0 - 15.0 g/dL    Comment: REPEATED TO VERIFY CRITICAL RESULT CALLED TO, READ BACK BY AND VERIFIED  WITH: SPURLOCKFRIZZELL  ON 02/03/2015 BY BOVELL,A.    HCT 15.1 (L) 36.0 - 46.0 %   MCV 69.3 (L) 78.0 - 100.0 fL   MCH 18.8 (L) 26.0 - 34.0 pg   MCHC 27.2 (L) 30.0 - 36.0 g/dL   RDW 16.1 (H) 09.6 - 04.5 %   Platelets 272 150 - 400 K/uL    Review of Systems  Constitutional: Negative for fever.  Gastrointestinal: Positive for nausea. Negative for vomiting.  Genitourinary: Positive for urgency and frequency. Negative for dysuria, hematuria and flank pain.  Musculoskeletal: Positive for back pain (chronic ).  Neurological: Positive for weakness. Negative for dizziness.   Physical Exam   Blood pressure 103/52, pulse 97, temperature 98.4 F (36.9 C), temperature source Oral, resp. rate 18, height  (1.676 m), weight 130.364 kg (287 lb 6.4 oz).  Physical Exam  Constitutional: She is oriented to person, place, and time. She appears well-developed and well-nourished.  Non-toxic appearance. She does not have a sickly appearance. She does not appear ill. No distress.  HENT:  Head: Normocephalic.  Eyes: Pupils are equal, round, and reactive to light.  Neck: Neck supple.  Cardiovascular: Normal rate.   Respiratory: Effort normal.  GI: Soft. There is no tenderness.  Genitourinary:  Speculum exam: Vagina - Small amount of bubbly vaginal bleeding. Strong odor.  Cervix - + active bleeding, small amount  Bimanual exam: Wet prep done Chaperone present for exam.  Musculoskeletal: Normal range of motion.  Neurological: She is alert and oriented to person, place, and time.  Skin: Skin is warm. She is not diaphoretic.  Psychiatric: Her behavior is normal.    MAU Course  Procedures  None  MDM Flagyl 2 grams given in MAU Megace Zofran 4 mg ODT  Dr. Adrian Blackwater At bedside; additional labs ordered    Assessment and Plan   A:  1. Symptomatic anemia   2. IRREGULAR MENSES   3. Nausea    P:  Admit to Women's unit for 4 units of PRBC's  Megace 40 mg PO Q12 Pelvic US complete       Duane Lope, NP 02/03/2015 12:59 PM

## 2015-02-03 NOTE — Progress Notes (Signed)
CRITICAL VALUE ALERT  Critical value received:  HGB 4.1   Date of notification: 02/02/15   Time of notification: 1325  Critical value read back: yes  Nurse who received alert: Early Osmond Spurlock-Frizzell  MD notified (1st page):  Venia Carbon NP   Time of first page:  Direct call 1328  MD notified (2nd page):  Time of second page:  Responding MD:  Venia Carbon  Time MD responded:  1328

## 2015-02-03 NOTE — MAU Provider Note (Signed)
History     CSN: [id number]  Arrival date and time: 02/03/15 1122   First Provider Initiated Contact with Patient 02/03/15 1213      Chief Complaint  Patient presents with  . Nausea  . Metrorrhagia   HPI    [name] is a 24 y.o. female G0P0000 presenting today with nausea; this is her main complaint.  The nausea started today; she has had off and on nausea in the past. She had never taken anything over the counter for the nausea. She has been to MAU in the past for nausea; she, at the time thought she was pregnant.   This morning she ate a friend bologna sandwhich and she vomited. She tried eating salt and drank water and this all came up as well.  Last night for dinner she had chips and did not feel well after eating this.   She has a history of GERD; [facility] and [facility] were managing her GERD. This was a diagnoses from middle school. She has had not treatment for GERD in several years. She feels the nausea/ vomiting may be related to this.   She is currently on her menstrual cycle; she started this cycle on July 30th; she describes the bleeding as heavy. In the last 24 hours she has changed 6-7 pads and has recently had to double her pads.  From May 8th- July 20 she bled everyday.   She has a history of irregular menstrual cycles; no PCOS history. No birth control currently. No history of fibroids at this time.    She denies pain at this time.    OB History    Gravida Para Term Preterm AB TAB SAB Ectopic Multiple Living        Past Medical History  Diagnosis Date  . Anemia   . GERD (gastroesophageal reflux disease)     Past Surgical History  Procedure Laterality Date  . Tonsillectomy      Family History  Problem Relation Age of Onset  . Hypertension Mother   . Diabetes Father   . Diabetes Maternal Grandmother     History  Substance Use Topics  . Smoking status: Current Every Day Smoker -- 0.50 packs/day   . Smokeless tobacco: Not on file  . Alcohol Use: Yes    Allergies: No Known Allergies  Prescriptions prior to admission  Medication Sig Dispense Refill Last Dose  . [DISCONTINUED] ondansetron (ZOFRAN ODT) 4 MG disintegrating tablet Take 1 tablet (4 mg total) by mouth every 8 (eight) hours as needed for nausea. 10 tablet 0   . [DISCONTINUED] promethazine (PHENERGAN) 25 MG tablet Take 1 tablet (25 mg total) by mouth every 6 (six) hours as needed for nausea. 12 tablet 0    Results for orders placed or performed during the hospital encounter of 02/03/15 (from the past 48 hour(s))  Urinalysis, Routine w reflex microscopic (not at [facility])     Status: Abnormal   Collection Time: 02/03/15 11:44 AM  Result Value Ref Range   Color, Urine YELLOW YELLOW   APPearance HAZY (A) CLEAR   Specific Gravity, Urine 1.025 1.005 - 1.030   pH 6.0 5.0 - 8.0   Glucose, UA NEGATIVE NEGATIVE mg/dL   Hgb urine dipstick LARGE (A) NEGATIVE   Bilirubin Urine NEGATIVE NEGATIVE   Ketones, ur 15 (A) NEGATIVE mg/dL   Protein, ur 30 (A) NEGATIVE mg/dL   Urobilinogen,  UA 0.2 0.0 - 1.0 mg/dL   Nitrite POSITIVE (A) NEGATIVE   Leukocytes, UA SMALL (A) NEGATIVE  Urine microscopic-add on     Status: Abnormal   Collection Time: 02/03/15 11:44 AM  Result Value Ref Range   Squamous Epithelial / LPF MANY (A) RARE   WBC, UA 11-20 <3 WBC/hpf   RBC / HPF 21-50 <3 RBC/hpf   Bacteria, UA MANY (A) RARE   Urine-Other MUCOUS PRESENT     Comment: TRICHOMONAS PRESENT  Pregnancy, urine POC     Status: None   Collection Time: 02/03/15 11:46 AM  Result Value Ref Range   Preg Test, Ur NEGATIVE NEGATIVE    Comment:        THE SENSITIVITY OF THIS METHODOLOGY IS >24 mIU/mL   CBC     Status: Abnormal   Collection Time: 02/03/15  1:00 PM  Result Value Ref Range   WBC 5.2 4.0 - 10.5 K/uL   RBC 2.18 (L) 3.87 - 5.11 MIL/uL   Hemoglobin 4.1 (LL) 12.0 - 15.0 g/dL    Comment: REPEATED TO VERIFY CRITICAL RESULT CALLED TO, READ BACK BY  AND VERIFIED WITH: SPURLOCKFRIZZELL @1225  ON 02/03/2015 BY BOVELL,A.    HCT 15.1 (L) 36.0 - 46.0 %   MCV 69.3 (L) 78.0 - 100.0 fL   MCH 18.8 (L) 26.0 - 34.0 pg   MCHC 27.2 (L) 30.0 - 36.0 g/dL   RDW 69.6 (H) 29.5 - 28.4 %   Platelets 272 150 - 400 K/uL    Review of Systems  Constitutional: Negative for fever.  Gastrointestinal: Positive for nausea. Negative for vomiting.  Genitourinary: Positive for urgency and frequency. Negative for dysuria, hematuria and flank pain.  Musculoskeletal: Positive for back pain (chronic ).  Neurological: Positive for weakness. Negative for dizziness.   Physical Exam   Blood pressure 103/52, pulse 97, temperature 98.4 F (36.9 C), temperature source Oral, resp. rate 18, height 5\' 6"  (1.676 m), weight 130.364 kg (287 lb 6.4 oz).  Physical Exam  Constitutional: She is oriented to person, place, and time. She appears well-developed and well-nourished.  Non-toxic appearance. She does not have a sickly appearance. She does not appear ill. No distress.  HENT:  Head: Normocephalic.  Eyes: Pupils are equal, round, and reactive to light.  Neck: Neck supple.  Cardiovascular: Normal rate.   Respiratory: Effort normal.  GI: Soft. There is no tenderness.  Genitourinary:  Speculum exam: Vagina - Small amount of bubbly vaginal bleeding. Strong odor.  Cervix - + active bleeding, small amount  Bimanual exam: Wet prep done Chaperone present for exam.  Musculoskeletal: Normal range of motion.  Neurological: She is alert and oriented to person, place, and time.  Skin: Skin is warm. She is not diaphoretic.  Psychiatric: Her behavior is normal.    MAU Course  Procedures  None  MDM Flagyl 2 grams given in MAU Megace Zofran 4 mg ODT  Dr. Adrian Blackwater At bedside; additional labs ordered    Assessment and Plan   A:  1. Symptomatic anemia   2. IRREGULAR MENSES   3. Nausea    P:  Admit to Women's unit for 4 units of PRBC's  Megace 40 mg PO Q12 Pelvic US  complete      Duane Lope, NP 02/03/2015 12:59 PM

## 2015-02-04 DIAGNOSIS — N938 Other specified abnormal uterine and vaginal bleeding: Secondary | ICD-10-CM

## 2015-02-04 DIAGNOSIS — D500 Iron deficiency anemia secondary to blood loss (chronic): Secondary | ICD-10-CM

## 2015-02-04 DIAGNOSIS — N390 Urinary tract infection, site not specified: Secondary | ICD-10-CM

## 2015-02-04 LAB — TYPE AND SCREEN
ABO/RH(D): O POS
Antibody Screen: NEGATIVE
UNIT DIVISION: 0
UNIT DIVISION: 0
Unit division: 0
Unit division: 0

## 2015-02-04 LAB — CBC
HCT: 26.2 % — ABNORMAL LOW (ref 36.0–46.0)
HEMATOCRIT: 15.1 % — AB (ref 36.0–46.0)
HEMOGLOBIN: 4.1 g/dL — AB (ref 12.0–15.0)
HEMOGLOBIN: 8.1 g/dL — AB (ref 12.0–15.0)
MCH: 18.8 pg — ABNORMAL LOW (ref 26.0–34.0)
MCH: 23.1 pg — ABNORMAL LOW (ref 26.0–34.0)
MCHC: 27.2 g/dL — AB (ref 30.0–36.0)
MCHC: 30.9 g/dL (ref 30.0–36.0)
MCV: 69.3 fL — ABNORMAL LOW (ref 78.0–100.0)
MCV: 74.6 fL — AB (ref 78.0–100.0)
Platelets: 272 10*3/uL (ref 150–400)
Platelets: 281 10*3/uL (ref 150–400)
RBC: 2.18 MIL/uL — ABNORMAL LOW (ref 3.87–5.11)
RBC: 3.51 MIL/uL — AB (ref 3.87–5.11)
RDW: 22.8 % — ABNORMAL HIGH (ref 11.5–15.5)
RDW: 21.2 % — ABNORMAL HIGH (ref 11.5–15.5)
WBC: 5.2 10*3/uL (ref 4.0–10.5)
WBC: 7 10*3/uL (ref 4.0–10.5)

## 2015-02-04 LAB — HIV ANTIBODY (ROUTINE TESTING W REFLEX): HIV Screen 4th Generation wRfx: NONREACTIVE

## 2015-02-04 LAB — GC/CHLAMYDIA PROBE AMP (~~LOC~~) NOT AT ARMC
Chlamydia: NEGATIVE
Neisseria Gonorrhea: NEGATIVE

## 2015-02-04 MED ORDER — MEGESTROL ACETATE 40 MG PO TABS: 40.0000 mg | ORAL_TABLET | Freq: Two times a day (BID) | ORAL | Status: DC

## 2015-02-04 MED ORDER — CEFADROXIL 500 MG PO CAPS: 500.0000 mg | ORAL_CAPSULE | Freq: Two times a day (BID) | ORAL | Status: AC

## 2015-02-04 NOTE — Progress Notes (Signed)
Patient discharged home with uncle... Discharge instructions reviewed with patient and she verbalized understanding... Condition stable... No equipment... Ambulated to car with Selinda Michaels, RN.

## 2015-02-04 NOTE — Discharge Instructions (Signed)

## 2015-02-04 NOTE — Discharge Summary (Signed)
    OB Discharge Summary  Patient Name: Carmen Price DOB: 1991-02-22 MRN: [id number]  Date of admission: 02/03/2015     Date of discharge: No discharge date for patient encounter.  Admitting diagnosis: Abnormal uterine bleeding, symptomatic anema, urinary tract infection   Intrauterine pregnancy: Unknown     Secondary diagnosis: None     Discharge diagnosis: same                                                                                               Procedures:blood transfusion      Hospital course:  Patient was admitted for blood transfusion.  She tolerated the transfusion well and was without complication.  Repeat Hg 8.1.  Patient to discharge on megace to control abnormal uterine bleeding.  Will also treat for UTI as patient has frequency.  Physical exam  Filed Vitals:   02/03/15 2217 02/03/15 2233 02/04/15 0003 02/04/15 0523  BP: 105/64 96/59 98/52  101/43  Pulse: 80 80 85 84  Temp: 98.7 F (37.1 C) 98 F (36.7 C) 98.2 F (36.8 C) 98 F (36.7 C)  TempSrc: Oral Oral Oral Oral  Resp: Height:      Weight:      SpO2: 100% 100% 100% 100%   General: alert, cooperative and no distress Heart: regular rate, no murmur Lungs: clear to auscultation bilaterally, no wheezing. Abdomen: Soft, nontender DVT Evaluation: No evidence of DVT seen on physical exam. Labs: Lab Results  Component Value Date   WBC 7.0 02/04/2015   HGB 8.1* 02/04/2015   HCT 26.2* 02/04/2015   MCV 74.6* 02/04/2015   PLT 281 02/04/2015   CMP Latest Ref Rng 02/15/2011  Glucose 70 - 99 mg/dL 409(W)  BUN 6 - 23 mg/dL 7  Creatinine 1.19 - 1.47 mg/dL 8.29(F)  Sodium 621 - 308 mEq/L 139  Potassium 3.5 - 5.1 mEq/L 3.3(L)  Chloride 96 - 112 mEq/L 107  CO2 19 - 32 mEq/L 24  Calcium 8.4 - 10.5 mg/dL 9.2  Total Protein 6.0 - 8.3 g/dL 7.8  Total Bilirubin 0.3 - 1.2 mg/dL 0.3  Alkaline Phos 39 - 117 U/L 64  AST 0 - 37 U/L 14  ALT 0 - 35 U/L 13    Discharge instruction: per After  Visit Summary and "Baby and Me Booklet".  Medications:   Medication List    TAKE these medications        cefadroxil 500 MG capsule  Commonly known as:  DURICEF  Take 1 capsule (500 mg total) by mouth 2 (two) times daily.     megestrol 40 MG tablet  Commonly known as:  MEGACE  Take 1 tablet (40 mg total) by mouth 2 (two) times daily.        Diet: routine diet  Activity: Advance as tolerated. Pelvic rest for 6 weeks.   Outpatient follow up:4 weeks  Less than 30 minutes spent on this discharge  02/04/2015 Kelaiah Escalona JEHIEL, DO

## 2015-02-05 LAB — URINE CULTURE: Culture: >=100000

## 2015-02-05 LAB — VON WILLEBRAND PANEL
Coagulation Factor VIII: 175 % — ABNORMAL HIGH (ref 50–150)
RISTOCETIN CO-FACTOR, PLASMA: 154 % — AB (ref 50–150)
VON WILLEBRAND ANTIGEN, PLASMA: 184 % — AB (ref 50–150)

## 2015-02-05 LAB — COAG STUDIES INTERP REPORT: PDF Image: 0

## 2015-02-26 ENCOUNTER — Ambulatory Visit: Payer: Medicaid Other | Admitting: Obstetrics and Gynecology

## 2015-08-17 ENCOUNTER — Ambulatory Visit (INDEPENDENT_AMBULATORY_CARE_PROVIDER_SITE_OTHER): Payer: Medicaid Other | Admitting: Obstetrics & Gynecology

## 2015-08-17 ENCOUNTER — Encounter: Payer: Self-pay | Admitting: Obstetrics & Gynecology

## 2015-08-17 VITALS — BP 104/61 | HR 68 | Temp 98.0°F | Ht 66.0 in | Wt 286.0 lb

## 2015-08-17 DIAGNOSIS — N939 Abnormal uterine and vaginal bleeding, unspecified: Secondary | ICD-10-CM

## 2015-08-17 DIAGNOSIS — Z111 Encounter for screening for respiratory tuberculosis: Secondary | ICD-10-CM

## 2015-08-17 MED ORDER — NORGESTIMATE-ETH ESTRADIOL 0.25-35 MG-MCG PO TABS: 1.0000 | ORAL_TABLET | Freq: Every day | ORAL | Status: DC

## 2015-08-17 NOTE — Progress Notes (Signed)
   CLINIC ENCOUNTER NOTE  History:  25 y.o. G0P0000 here today for discussion of evaluation of AUB.  Was admitted for symptomatic anemia in 02/2015, reported having irregular bleeding since then.  No symptoms of anemia currently, no current bleeding.  She denies any abnormal vaginal discharge, pelvic pain or other concerns.   Past Medical History  Diagnosis Date  . Anemia   . GERD (gastroesophageal reflux disease)     Past Surgical History  Procedure Laterality Date  . Tonsillectomy      The following portions of the patient's history were reviewed and updated as appropriate: allergies, current medications, past family history, past medical history, past social history, past surgical history and problem list.   Health Maintenance:  Normal pap and positive HRHPV on 04/05/2011.  Reports she received HPV vaccine series when she was younger.   Review of Systems:  Pertinent items noted in HPI and remainder of comprehensive ROS otherwise negative.  Objective:  Physical Exam BP 104/61 mmHg  Pulse 68  Temp(Src) 98 F (36.7 C) (Oral)  Ht  (1.676 m)  Wt 286 lb (129.729 kg)  BMI 46.18 kg/m2  LMP 07/29/2015 CONSTITUTIONAL: Well-developed, well-nourished female in no acute distress.  HENT:  Normocephalic, atraumatic. External right and left ear normal. Oropharynx is clear and moist EYES: Conjunctivae and EOM are normal. Pupils are equal, round, and reactive to light. No scleral icterus.  NECK: Normal range of motion, supple, no masses SKIN: Skin is warm and dry. No rash noted. Not diaphoretic. No erythema. No pallor. NEUROLOGIC: Alert and oriented to person, place, and time. Normal reflexes, muscle tone coordination. No cranial nerve deficit noted. PSYCHIATRIC: Normal mood and affect. Normal behavior. Normal judgment and thought content. CARDIOVASCULAR: Normal heart rate noted RESPIRATORY: Effort and breath sounds normal, no problems with respiration noted ABDOMEN: Soft, no  distention noted.   PELVIC: Deferred MUSCULOSKELETAL: Normal range of motion. No edema noted.   Assessment & Plan:  Abnormal uterine bleeding (AUB) Patient desires OCPs for AUB control. This was prescribed. - norgestimate-ethinyl estradiol (ORTHO-CYCLEN,SPRINTEC,PREVIFEM) 0.25-35 MG-MCG tablet; Take 1 tablet by mouth daily.  Dispense: 1 Package; Refill: 11 Routine preventative health maintenance measures emphasized; will return in 2 months for pap and BP/OCP check. TB test done today as per her request, it will be read in 24-48 hours. Please refer to After Visit Summary for other counseling recommendations.   Return in about 2 months (around 10/15/2015) for Annual exam and BP/OCP check.    Jaynie Collins, MD, FACOG Attending Obstetrician & Gynecologist, [facility]

## 2015-08-17 NOTE — Addendum Note (Signed)
Addended by: Kathee Delton on: 08/17/2015 02:53 PM   Modules accepted: Orders

## 2015-08-19 ENCOUNTER — Encounter: Payer: Self-pay | Admitting: *Deleted

## 2016-05-27 ENCOUNTER — Inpatient Hospital Stay (HOSPITAL_COMMUNITY)
Admission: AD | Admit: 2016-05-27 | Discharge: 2016-05-27 | Disposition: A | Discharge status: Home / Self Care | Payer: Self-pay | Source: Ambulatory Visit | Attending: Obstetrics & Gynecology | Admitting: Obstetrics & Gynecology

## 2016-05-27 ENCOUNTER — Encounter (HOSPITAL_COMMUNITY): Payer: Self-pay

## 2016-05-27 DIAGNOSIS — N938 Other specified abnormal uterine and vaginal bleeding: Secondary | ICD-10-CM

## 2016-05-27 DIAGNOSIS — Z833 Family history of diabetes mellitus: Secondary | ICD-10-CM | POA: Insufficient documentation

## 2016-05-27 DIAGNOSIS — Z8249 Family history of ischemic heart disease and other diseases of the circulatory system: Secondary | ICD-10-CM | POA: Insufficient documentation

## 2016-05-27 DIAGNOSIS — K219 Gastro-esophageal reflux disease without esophagitis: Secondary | ICD-10-CM | POA: Insufficient documentation

## 2016-05-27 DIAGNOSIS — D649 Anemia, unspecified: Secondary | ICD-10-CM | POA: Insufficient documentation

## 2016-05-27 DIAGNOSIS — Z87891 Personal history of nicotine dependence: Secondary | ICD-10-CM | POA: Insufficient documentation

## 2016-05-27 DIAGNOSIS — N920 Excessive and frequent menstruation with regular cycle: Secondary | ICD-10-CM | POA: Insufficient documentation

## 2016-05-27 LAB — CBC WITH DIFFERENTIAL/PLATELET
Basophils Absolute: 0 10*3/uL (ref 0.0–0.1)
Basophils Relative: 0 %
EOS ABS: 0 10*3/uL (ref 0.0–0.7)
EOS PCT: 1 %
HCT: 32.1 % — ABNORMAL LOW (ref 36.0–46.0)
Hemoglobin: 10.6 g/dL — ABNORMAL LOW (ref 12.0–15.0)
LYMPHS ABS: 2.4 10*3/uL (ref 0.7–4.0)
Lymphocytes Relative: 43 %
MCH: 30.8 pg (ref 26.0–34.0)
MCHC: 33 g/dL (ref 30.0–36.0)
MCV: 93.3 fL (ref 78.0–100.0)
MONO ABS: 0.3 10*3/uL (ref 0.1–1.0)
MONOS PCT: 5 %
Neutro Abs: 2.8 10*3/uL (ref 1.7–7.7)
Neutrophils Relative %: 51 %
Platelets: 217 10*3/uL (ref 150–400)
RBC: 3.44 MIL/uL — ABNORMAL LOW (ref 3.87–5.11)
RDW: 13.8 % (ref 11.5–15.5)
WBC: 5.5 10*3/uL (ref 4.0–10.5)

## 2016-05-27 LAB — URINE MICROSCOPIC-ADD ON

## 2016-05-27 LAB — POCT PREGNANCY, URINE: PREG TEST UR: NEGATIVE

## 2016-05-27 LAB — URINALYSIS, ROUTINE W REFLEX MICROSCOPIC
Bilirubin Urine: NEGATIVE
Glucose, UA: NEGATIVE mg/dL
Ketones, ur: NEGATIVE mg/dL
Leukocytes, UA: NEGATIVE
Nitrite: NEGATIVE
PROTEIN: 100 mg/dL — AB
Specific Gravity, Urine: >1.03 — ABNORMAL HIGH (ref 1.005–1.030)
pH: 6 (ref 5.0–8.0)

## 2016-05-27 LAB — WET PREP, GENITAL
Clue Cells Wet Prep HPF POC: NONE SEEN
SPERM: NONE SEEN
Trich, Wet Prep: NONE SEEN
WBC, Wet Prep HPF POC: NONE SEEN
YEAST WET PREP: NONE SEEN

## 2016-05-27 MED ORDER — NORETHINDRONE 0.35 MG PO TABS: 1.0000 | ORAL_TABLET | Freq: Every day | ORAL | 11 refills | Status: DC

## 2016-05-27 NOTE — MAU Note (Signed)
Pt states she has been bleeding since 11/10, the last time she bled this long she had to get a blood transfusion.  States bleeding is heavy, passing clots.  Having lower abd cramping.

## 2016-05-27 NOTE — MAU Provider Note (Signed)
History    Patient Carmen Price is a 89 year G0P0 here with complaints of bleeding for two weeks. She has a history of heavy periods, including a blood transfusion due to anemia. She has not had a regular gyn doctor recently since she has been living here and in [place].  CSN: [id number]  Arrival date and time: 05/27/16 1724   First Provider Initiated Contact with Patient 05/27/16 1926      Chief Complaint  Patient presents with  . Vaginal Bleeding   Vaginal Bleeding  The patient's primary symptoms include vaginal bleeding. The patient's pertinent negatives include no genital itching, genital lesions, genital odor, genital rash, missed menses, pelvic pain or vaginal discharge. This is a chronic problem. The current episode started 1 to 4 weeks ago. The problem occurs constantly. The problem has been unchanged. The patient is experiencing no pain. She is not pregnant. Pertinent negatives include no abdominal pain, anorexia, back pain, chills, constipation, diarrhea, discolored urine, dysuria, fever, flank pain, frequency, headaches, hematuria, joint swelling, nausea, painful intercourse, rash, sore throat or urgency. The vaginal discharge was normal. The vaginal bleeding is typical of menses. She has been passing clots. She has not been passing tissue. Nothing aggravates the symptoms. She has tried NSAIDs for the symptoms. The treatment provided mild relief. She is sexually active. She uses nothing for contraception. Her menstrual history has been irregular.    OB History    Gravida Para Term Preterm AB Living   0 0 0 0 0 0   SAB TAB Ectopic Multiple Live Births   0 0 0 0        Past Medical History:  Diagnosis Date  . Anemia   . GERD (gastroesophageal reflux disease)     Past Surgical History:  Procedure Laterality Date  . TONSILLECTOMY      Family History  Problem Relation Age of Onset  . Hypertension Mother   . Diabetes Father   . Diabetes Maternal Grandmother      Social History  Substance Use Topics  . Smoking status: Former Smoker    Packs/day: 0.25    Years: 4.00    Types: Cigarettes  . Smokeless tobacco: Never Used  . Alcohol use 0.0 oz/week     Comment: Social    Allergies: No Known Allergies  Prescriptions Prior to Admission  Medication Sig Dispense Refill Last Dose  . norgestimate-ethinyl estradiol (ORTHO-CYCLEN,SPRINTEC,PREVIFEM) 0.25-35 MG-MCG tablet Take 1 tablet by mouth daily. (Patient not taking: Reported on 05/27/2016) 1 Package 11 Not Taking at Unknown time    Review of Systems  Constitutional: Negative for chills and fever.  HENT: Negative for sore throat.   Gastrointestinal: Negative for abdominal pain, anorexia, constipation, diarrhea and nausea.  Genitourinary: Positive for vaginal bleeding. Negative for dysuria, flank pain, frequency, hematuria, missed menses, pelvic pain, urgency and vaginal discharge.  Musculoskeletal: Negative for back pain.  Skin: Negative for rash.  Neurological: Negative for headaches.   Physical Exam   Blood pressure 126/75, pulse 67, temperature 97.5 F (36.4 C), temperature source Oral, resp. rate 18, height 5\' 6"  (1.676 m), weight 132 kg (291 lb), last menstrual period 05/13/2016.  Physical Exam  Constitutional: She is oriented to person, place, and time. She appears well-developed and well-nourished.  Neck: Normal range of motion.  Respiratory: Effort normal.  GI: Soft. She exhibits no distension and no mass. There is no tenderness. There is no rebound and no guarding.  Genitourinary: Vaginal discharge found.  Genitourinary Comments: Moderate bleeding visible  on speculum exam. External and internal genitalia normal appearing with no lesions or erythema. Cervix is pink without lesions. No suprapubic pain and no CMT.   Musculoskeletal: Normal range of motion.  Neurological: She is alert and oriented to person, place, and time.  Skin: Skin is warm and dry.    MAU Course   Procedures  MDM -CBC -speculum and pelvic  -UA -  Assessment and Plan  Patiet Carmen Price is a 25 year old G0P0 with heavy periods. She is here to make sure that she is not anemic after a history of blood transfusion in the past due to heavy periods. CBC is WNL; patient denies SOB, tachycardia, dizziness. Discharged home with RX for POP and encouraged to make an appointment with the clinic.  Luna KitchensKathryn Shelley Pooley CNM   Charlesetta GaribaldiKathryn Lorraine Tailor Westfall 05/27/2016, 8:56 PM

## 2016-05-27 NOTE — Discharge Instructions (Signed)
Menorrhagia Menorrhagia is when your menstrual periods are heavy or last longer than usual. Follow these instructions at home:  Only take medicine as told by your doctor.  Take any iron pills as told by your doctor. Heavy bleeding may cause low levels of iron in your body.  Do not take aspirin 1 week before or during your period. Aspirin can make the bleeding worse.  Lie down for a while if you change your tampon or pad more than once in 2 hours. This may help lessen the bleeding.  Eat a healthy diet and foods with iron. These foods include leafy green vegetables, meat, liver, eggs, and whole grain breads and cereals.  Do not try to lose weight. Wait until the heavy bleeding has stopped and your iron level is normal. Contact a doctor if:  You soak through a pad or tampon every 1 or 2 hours, and this happens every time you have a period.  You need to use pads and tampons at the same time because you are bleeding so much.  You need to change your pad or tampon during the night.  You have a period that lasts for more than 8 days.  You pass clots bigger than 1 inch (2.5 cm) wide.  You have irregular periods that happen more or less often than once a month.  You feel dizzy or pass out (faint).  You feel very weak or tired.  You feel short of breath or feel your heart is beating too fast when you exercise.  You feel sick to your stomach (nausea) and you throw up (vomit) while you are taking your medicine.  You have watery poop (diarrhea) while you are taking your medicine.  You have any problems that may be related to the medicine you are taking. Get help right away if:  You soak through 4 or more pads or tampons in 2 hours.  You have any bleeding while you are pregnant. This information is not intended to replace advice given to you by your health care provider. Make sure you discuss any questions you have with your health care provider. Document Released: 03/29/2008 Document  Revised: 11/26/2015 Document Reviewed: 12/20/2012 Elsevier Interactive Patient Education  2017 [employer]vier Inc.  

## 2016-06-01 LAB — GC/CHLAMYDIA PROBE AMP (~~LOC~~) NOT AT ARMC
CHLAMYDIA, DNA PROBE: POSITIVE — AB
Neisseria Gonorrhea: NEGATIVE

## 2016-06-03 ENCOUNTER — Telehealth: Payer: Self-pay | Admitting: Student

## 2016-06-03 DIAGNOSIS — A749 Chlamydial infection, unspecified: Secondary | ICD-10-CM

## 2016-06-03 MED ORDER — AZITHROMYCIN 500 MG PO TABS: 1000.0000 mg | ORAL_TABLET | Freq: Once | ORAL | 0 refills | Status: AC

## 2016-06-03 NOTE — Telephone Encounter (Signed)
Notified of positive chlamydia result. Verified NKDA. Azithromicin sent to pharmacy of pt's choice. Partner needs to be treated. No intercourse x 1 week after treatment. [facility]

## 2017-02-28 ENCOUNTER — Ambulatory Visit (INDEPENDENT_AMBULATORY_CARE_PROVIDER_SITE_OTHER): Payer: Self-pay | Admitting: Physician Assistant

## 2017-03-21 ENCOUNTER — Ambulatory Visit (INDEPENDENT_AMBULATORY_CARE_PROVIDER_SITE_OTHER): Payer: Self-pay | Admitting: Physician Assistant

## 2017-03-21 ENCOUNTER — Other Ambulatory Visit (HOSPITAL_COMMUNITY)
Admission: RE | Admit: 2017-03-21 | Discharge: 2017-03-21 | Disposition: A | Discharge status: Home / Self Care | Payer: Self-pay | Source: Ambulatory Visit | Attending: Physician Assistant | Admitting: Physician Assistant

## 2017-03-21 ENCOUNTER — Encounter (INDEPENDENT_AMBULATORY_CARE_PROVIDER_SITE_OTHER): Payer: Self-pay | Admitting: Physician Assistant

## 2017-03-21 VITALS — BP 116/78 | HR 78 | Temp 98.0°F | Wt 306.0 lb

## 2017-03-21 DIAGNOSIS — B9689 Other specified bacterial agents as the cause of diseases classified elsewhere: Secondary | ICD-10-CM | POA: Insufficient documentation

## 2017-03-21 DIAGNOSIS — Z23 Encounter for immunization: Secondary | ICD-10-CM

## 2017-03-21 DIAGNOSIS — Z131 Encounter for screening for diabetes mellitus: Secondary | ICD-10-CM

## 2017-03-21 DIAGNOSIS — Z124 Encounter for screening for malignant neoplasm of cervix: Secondary | ICD-10-CM

## 2017-03-21 DIAGNOSIS — A5901 Trichomonal vulvovaginitis: Secondary | ICD-10-CM | POA: Insufficient documentation

## 2017-03-21 DIAGNOSIS — L739 Follicular disorder, unspecified: Secondary | ICD-10-CM

## 2017-03-21 LAB — POCT GLYCOSYLATED HEMOGLOBIN (HGB A1C): HEMOGLOBIN A1C: 5.2

## 2017-03-21 MED ORDER — CLINDAMYCIN PHOSPHATE 1 % EX GEL: Freq: Two times a day (BID) | CUTANEOUS | 0 refills | Status: DC

## 2017-03-21 NOTE — Progress Notes (Signed)
Subjective:  Patient ID: Carmen Price, female    DOB: Mar 12, 1991  Age: 26 y.o. MRN: [id number]  CC: pap smear  HPI Carmen Price is a 26 y.o. female with a medical history of anemia and GERD presents to have a PAP smear done. Has not had a PAP done in a few years. Had an abnormal PAP at one point but is unsure as to what was diagnosed. Has never had any cervical procedures or colposcopy done. Does not endorse vaginal drainage, dyspareunia, abdominal pain, pelvic pain, f/c/n/v, or current pregnancy.       Outpatient Medications Prior to Visit  Medication Sig Dispense Refill  . norethindrone (ORTHO MICRONOR) 0.35 MG tablet Take 1 tablet (0.35 mg total) by mouth daily. (Patient not taking: Reported on 03/21/2017) 1 Package 11   No facility-administered medications prior to visit.      ROS Review of Systems  Constitutional: Negative for chills, fever and malaise/fatigue.  Eyes: Negative for blurred vision.  Respiratory: Negative for shortness of breath.   Cardiovascular: Negative for chest pain and palpitations.  Gastrointestinal: Negative for abdominal pain and nausea.  Genitourinary: Negative for dysuria and hematuria.  Musculoskeletal: Negative for joint pain and myalgias.  Skin: Positive for rash.  Neurological: Negative for tingling and headaches.  Psychiatric/Behavioral: Negative for depression. The patient is not nervous/anxious.     Objective:  BP 116/78 (BP Location: Left Arm, Patient Position: Sitting, Cuff Size: Large)   Pulse 78   Temp 98 F (36.7 C) (Oral)   Wt (!) 306 lb (138.8 kg)   LMP 02/25/2017 (Exact Date)   SpO2 98%   BMI 49.39 kg/m   BP/Weight 03/21/2017 05/27/2016 08/17/2015  Systolic BP 116 126 104  Diastolic BP 78 75 61  Wt. (Lbs) 306 291 286  BMI 49.39 46.97 46.18      Physical Exam  Constitutional: She is oriented to person, place, and time.  Well developed, obese, NAD, polite, not well versed  HENT:  Head: Normocephalic and  atraumatic.  Cardiovascular: Normal rate, regular rhythm and normal heart sounds.   Pulmonary/Chest: Effort normal and breath sounds normal.  Genitourinary:  Genitourinary Comments: Vagina normal, no discharge. Cervix normal, nontender. Adnexa without mass or tenderness bilaterally. Uterus without mass or tenderness. Several firm nodules on pubic mons appearing as old lesions, no current erythema, suppuration, bleeding, crusting, or scaling.  Musculoskeletal: She exhibits no edema.  Neurological: She is alert and oriented to person, place, and time.  Skin: Skin is warm and dry. No rash noted. No erythema. No pallor.  Psychiatric: She has a normal mood and affect. Her behavior is normal. Thought content normal.  Vitals reviewed.    Assessment & Plan:    1. Screening for cervical cancer - Cytology - PAP   2. Folliculitis- clindamycin (CLINDAGEL) 1 % gel; Apply topically 2 (two) times daily.  Dispense: 30 g; Refill: 0  3. Screening for diabetes mellitus - HgB A1c 5.2% in clinic today.  4. Need for Tdap vaccination - Tdap vaccine greater than or equal to 7yo IM   Meds ordered this encounter  Medications  . clindamycin (CLINDAGEL) 1 % gel    Sig: Apply topically 2 (two) times daily.    Dispense:  30 g    Refill:  0    Generic if available please.    Order Specific Question:   Supervising Provider    Answer:   Carmen Price [[id number]]    Follow-up: Return if symptoms worsen  or fail to improve.   Carmen Demark PA

## 2017-03-21 NOTE — Patient Instructions (Signed)

## 2017-03-23 ENCOUNTER — Other Ambulatory Visit (INDEPENDENT_AMBULATORY_CARE_PROVIDER_SITE_OTHER): Payer: Self-pay | Admitting: Physician Assistant

## 2017-03-23 DIAGNOSIS — B9689 Other specified bacterial agents as the cause of diseases classified elsewhere: Secondary | ICD-10-CM

## 2017-03-23 DIAGNOSIS — N760 Acute vaginitis: Secondary | ICD-10-CM

## 2017-03-23 DIAGNOSIS — A5901 Trichomonal vulvovaginitis: Secondary | ICD-10-CM

## 2017-03-23 DIAGNOSIS — R87618 Other abnormal cytological findings on specimens from cervix uteri: Secondary | ICD-10-CM

## 2017-03-23 LAB — CYTOLOGY - PAP
Bacterial vaginitis: POSITIVE — AB
Candida vaginitis: NEGATIVE
Chlamydia: NEGATIVE
Neisseria Gonorrhea: NEGATIVE
Trichomonas: POSITIVE — AB

## 2017-03-23 MED ORDER — METRONIDAZOLE 500 MG PO TABS
500.0000 mg | ORAL_TABLET | Freq: Two times a day (BID) | ORAL | 0 refills | Status: AC
Start: 2017-03-23 — End: 2017-03-30

## 2017-04-03 ENCOUNTER — Encounter (HOSPITAL_COMMUNITY): Payer: Self-pay | Admitting: Emergency Medicine

## 2017-04-03 ENCOUNTER — Emergency Department (HOSPITAL_COMMUNITY)
Admission: EM | Admit: 2017-04-03 | Discharge: 2017-04-03 | Disposition: A | Discharge status: Home / Self Care | Payer: Self-pay | Attending: Emergency Medicine | Admitting: Emergency Medicine

## 2017-04-03 DIAGNOSIS — Z79899 Other long term (current) drug therapy: Secondary | ICD-10-CM | POA: Insufficient documentation

## 2017-04-03 DIAGNOSIS — A599 Trichomoniasis, unspecified: Secondary | ICD-10-CM | POA: Insufficient documentation

## 2017-04-03 DIAGNOSIS — R102 Pelvic and perineal pain: Secondary | ICD-10-CM | POA: Insufficient documentation

## 2017-04-03 DIAGNOSIS — Z87891 Personal history of nicotine dependence: Secondary | ICD-10-CM | POA: Insufficient documentation

## 2017-04-03 LAB — URINALYSIS, ROUTINE W REFLEX MICROSCOPIC
Bilirubin Urine: NEGATIVE
GLUCOSE, UA: NEGATIVE mg/dL
KETONES UR: NEGATIVE mg/dL
Nitrite: NEGATIVE
PROTEIN: NEGATIVE mg/dL
Specific Gravity, Urine: 1.023 (ref 1.005–1.030)
pH: 6 (ref 5.0–8.0)

## 2017-04-03 LAB — POC URINE PREG, ED: Preg Test, Ur: NEGATIVE

## 2017-04-03 MED ORDER — IBUPROFEN 600 MG PO TABS: 600.0000 mg | ORAL_TABLET | Freq: Four times a day (QID) | ORAL | 0 refills | Status: DC | PRN

## 2017-04-03 MED ORDER — METRONIDAZOLE 500 MG PO TABS: 500.0000 mg | ORAL_TABLET | Freq: Two times a day (BID) | ORAL | 0 refills | Status: DC

## 2017-04-03 NOTE — ED Provider Notes (Signed)
[facility] Provider Note   CSN: [id number] Arrival date & time: 04/03/17  1516     History   Chief Complaint Chief Complaint  Patient presents with  . Pelvic Pain    HPI Carmen Price is a 26 y.o. female.  HPI  The patient is a 26 year old female, no significant abdominal history other than some abnormal uterine bleeding, she was seen within the last 2 weeks at a clinic where she had a Pap smear performed as well as STD testing, she was positive for Trichomonas and was prescribed Clindagel which she has been using, denies any discharge but has had a intermittent left pelvic discomfort over the last 2 weeks ever since having that exam. She has not had any urinary symptoms, she is concerned that she may have developed pregnancy after she had an unprotected sexual encounter right around the time of her pelvic exam. She has not taken any home pregnancy test. There is no pain in the back, the pain in the left lower pelvis is mild, intermittent, nothing seems to make it better or worse, no diarrhea, no hematuria, no rectal bleeding, normal appetite, no vomiting. She denies any prior abdominal surgical history.  Another thing that concerns the patient is that she had a period that lasted approximately 9 days rather than 5 as per her usual, it is now ended  Past Medical History:  Diagnosis Date  . Anemia   . GERD (gastroesophageal reflux disease)     Patient Active Problem List   Diagnosis Date Noted  . Symptomatic anemia 02/03/2015  . Blood transfusion during current hospitalization 02/03/2015  . CHILDHOOD OBESITY 03/13/2007  . Abnormal uterine bleeding (AUB) 03/13/2007  . ACNE VULGARIS, FACIAL 03/13/2007    Past Surgical History:  Procedure Laterality Date  . TONSILLECTOMY      OB History    Gravida Para Term Preterm AB Living   0 0 0 0 0 0   SAB TAB Ectopic Multiple Live Births   0 0 0 0         Home Medications    Prior to Admission medications     Medication Sig Start Date End Date Taking? Authorizing Provider  clindamycin (CLINDAGEL) 1 % gel Apply topically 2 (two) times daily. 03/21/17   Loletta Specter, PA-C  ibuprofen (ADVIL,MOTRIN) 600 MG tablet Take 1 tablet (600 mg total) by mouth every 6 (six) hours as needed. 04/03/17   Eber Hong, MD  metroNIDAZOLE (FLAGYL) 500 MG tablet Take 1 tablet (500 mg total) by mouth 2 (two) times daily. 04/03/17   Eber Hong, MD  norethindrone (ORTHO MICRONOR) 0.35 MG tablet Take 1 tablet (0.35 mg total) by mouth daily. Patient not taking: Reported on 03/21/2017 05/27/16   Marylene Land, CNM    Family History Family History  Problem Relation Age of Onset  . Hypertension Mother   . Diabetes Father   . Diabetes Maternal Grandmother     Social History Social History  Substance Use Topics  . Smoking status: Former Smoker    Packs/day: 0.25    Years: 4.00    Types: Cigarettes  . Smokeless tobacco: Never Used  . Alcohol use 0.0 oz/week     Comment: Social     Allergies   Patient has no known allergies.   Review of Systems Review of Systems  All other systems reviewed and are negative.    Physical Exam Updated Vital Signs BP (!) 153/128 (BP Location: Right Arm)   Pulse 99  Temp 98.3 F (36.8 C) (Oral)   Resp 18   LMP 03/31/2017   SpO2 100%   Physical Exam  Constitutional: She appears well-developed and well-nourished. No distress.  HENT:  Head: Normocephalic and atraumatic.  Mouth/Throat: Oropharynx is clear and moist. No oropharyngeal exudate.  Eyes: Pupils are equal, round, and reactive to light. Conjunctivae and EOM are normal. Right eye exhibits no discharge. Left eye exhibits no discharge. No scleral icterus.  Neck: Normal range of motion. Neck supple. No JVD present. No thyromegaly present.  Cardiovascular: Normal rate, regular rhythm, normal heart sounds and intact distal pulses.  Exam reveals no gallop and no friction rub.   No murmur  heard. Pulmonary/Chest: Effort normal and breath sounds normal. No respiratory distress. She has no wheezes. She has no rales.  Abdominal: Soft. Bowel sounds are normal. She exhibits no distension and no mass. There is no tenderness.  Minimal if any left lower quadrant tenderness, no guarding, no masses, no peritoneal signs, no guarding  Musculoskeletal: Normal range of motion. She exhibits no edema or tenderness.  Lymphadenopathy:    She has no cervical adenopathy.  Neurological: She is alert. Coordination normal.  Skin: Skin is warm and dry. No rash noted. No erythema.  Psychiatric: She has a normal mood and affect. Her behavior is normal.  Nursing note and vitals reviewed.    ED Treatments / Results  Labs (all labs ordered are listed, but only abnormal results are displayed) Labs Reviewed  URINALYSIS, ROUTINE W REFLEX MICROSCOPIC - Abnormal; Notable for the following:       Result Value   APPearance HAZY (*)    Hgb urine dipstick SMALL (*)    Leukocytes, UA TRACE (*)    Bacteria, UA FEW (*)    Squamous Epithelial / LPF 6-30 (*)    All other components within normal limits  POC URINE PREG, ED    Radiology No results found.  Procedures Procedures (including critical care time)  Medications Ordered in ED Medications - No data to display   Initial Impression / Assessment and Plan / ED Course  I have reviewed the triage vital signs and the nursing notes.  Pertinent labs & imaging results that were available during my care of the patient were reviewed by me and considered in my medical decision making (see chart for details).     Will defer pelvic exam since it was just performed. She will be given a course of Flagyl in case the Clindagel has not completely cleared her Trichomonas. Urine pregnancy is negative, urinalysis is pending, the patient can follow up outpatient. Ibuprofen recommended for intermittent lower abdominal pain.  UA neg preg neg Stable for d/c Motrin  for pain Return for worsening  Final Clinical Impressions(s) / ED Diagnoses   Final diagnoses:  Pelvic pain in female  Trichomonosis    New Prescriptions New Prescriptions   IBUPROFEN (ADVIL,MOTRIN) 600 MG TABLET    Take 1 tablet (600 mg total) by mouth every 6 (six) hours as needed.   METRONIDAZOLE (FLAGYL) 500 MG TABLET    Take 1 tablet (500 mg total) by mouth 2 (two) times daily.     Eber Hong, MD 04/03/17 [phone]

## 2017-04-03 NOTE — ED Triage Notes (Signed)
Pt to ER for evaluation of pelvic pain onset Friday when she started her menstrual cycle. States is worried about STD's. NAD

## 2017-04-03 NOTE — Discharge Instructions (Signed)
Your testing today was reassuring You are not pregnant You do not have a urinary infection Have your doctor check you in the next week if no better with ibuprofen 3 times daily.

## 2017-04-07 MED FILL — metroNIDAZOLE 500 MG TABS: 500 | 7 days supply | Qty: 14 | Fill #0

## 2017-05-11 ENCOUNTER — Encounter: Payer: Self-pay | Admitting: Family Medicine

## 2017-05-11 ENCOUNTER — Ambulatory Visit: Payer: Self-pay | Admitting: Family Medicine

## 2017-05-11 NOTE — Progress Notes (Signed)
Patient did not keep appointment today. She will be called to reschedule.  

## 2017-09-05 ENCOUNTER — Other Ambulatory Visit: Payer: Self-pay

## 2017-09-05 ENCOUNTER — Emergency Department (HOSPITAL_COMMUNITY)
Admission: EM | Admit: 2017-09-05 | Discharge: 2017-09-05 | Disposition: A | Discharge status: Home / Self Care | Payer: Self-pay | Attending: Emergency Medicine | Admitting: Emergency Medicine

## 2017-09-05 ENCOUNTER — Encounter (HOSPITAL_COMMUNITY): Payer: Self-pay | Admitting: Emergency Medicine

## 2017-09-05 DIAGNOSIS — R6883 Chills (without fever): Secondary | ICD-10-CM | POA: Insufficient documentation

## 2017-09-05 DIAGNOSIS — R112 Nausea with vomiting, unspecified: Secondary | ICD-10-CM | POA: Insufficient documentation

## 2017-09-05 DIAGNOSIS — Z87891 Personal history of nicotine dependence: Secondary | ICD-10-CM | POA: Insufficient documentation

## 2017-09-05 DIAGNOSIS — R197 Diarrhea, unspecified: Secondary | ICD-10-CM | POA: Insufficient documentation

## 2017-09-05 LAB — CBC
HEMATOCRIT: 39.4 % (ref 36.0–46.0)
HEMOGLOBIN: 13 g/dL (ref 12.0–15.0)
MCH: 31.5 pg (ref 26.0–34.0)
MCHC: 33 g/dL (ref 30.0–36.0)
MCV: 95.4 fL (ref 78.0–100.0)
Platelets: 255 10*3/uL (ref 150–400)
RBC: 4.13 MIL/uL (ref 3.87–5.11)
RDW: 13.6 % (ref 11.5–15.5)
WBC: 4.6 10*3/uL (ref 4.0–10.5)

## 2017-09-05 LAB — COMPREHENSIVE METABOLIC PANEL
ALBUMIN: 3.6 g/dL (ref 3.5–5.0)
ALK PHOS: 57 U/L (ref 38–126)
ALT: 14 U/L (ref 14–54)
ANION GAP: 9 (ref 5–15)
AST: 17 U/L (ref 15–41)
BUN: 5 mg/dL — ABNORMAL LOW (ref 6–20)
CALCIUM: 8.9 mg/dL (ref 8.9–10.3)
CO2: 27 mmol/L (ref 22–32)
Chloride: 103 mmol/L (ref 101–111)
Creatinine, Ser: 0.62 mg/dL (ref 0.44–1.00)
GFR calc Af Amer: >60 mL/min (ref >60)
GFR calc non Af Amer: >60 mL/min (ref >60)
Glucose, Bld: 83 mg/dL (ref 65–99)
Potassium: 3.7 mmol/L (ref 3.5–5.1)
Sodium: 139 mmol/L (ref 135–145)
Total Bilirubin: 0.5 mg/dL (ref 0.3–1.2)
Total Protein: 7.4 g/dL (ref 6.5–8.1)

## 2017-09-05 LAB — URINALYSIS, ROUTINE W REFLEX MICROSCOPIC
BILIRUBIN URINE: NEGATIVE
GLUCOSE, UA: NEGATIVE mg/dL
Hgb urine dipstick: NEGATIVE
Ketones, ur: 5 mg/dL — AB
Leukocytes, UA: NEGATIVE
Nitrite: NEGATIVE
PH: 6 (ref 5.0–8.0)
Protein, ur: NEGATIVE mg/dL
SPECIFIC GRAVITY, URINE: 1.016 (ref 1.005–1.030)

## 2017-09-05 LAB — LIPASE, BLOOD: Lipase: 20 U/L (ref 11–51)

## 2017-09-05 LAB — I-STAT BETA HCG BLOOD, ED (MC, WL, AP ONLY): I-stat hCG, quantitative: <5 m[IU]/mL (ref <5)

## 2017-09-05 MED ORDER — ONDANSETRON 4 MG PO TBDP
4.0000 mg | ORAL_TABLET | Freq: Once | ORAL | Status: AC | PRN
  Administered 2017-09-05: 4 mg via ORAL

## 2017-09-05 MED ORDER — ONDANSETRON 4 MG PO TBDP: 4.0000 mg | ORAL_TABLET | Freq: Three times a day (TID) | ORAL | 0 refills | Status: AC | PRN

## 2017-09-05 NOTE — Discharge Instructions (Signed)
1. Medications: Alternate 600 mg of ibuprofen and [phone] mg of Tylenol every 3 hours as needed for pain. Do not exceed 4000 mg of Tylenol daily.  Take ibuprofen with food to avoid upset stomach.  Take Zofran as needed for nausea.  Wait around 20 minutes before eating or drinking after taking this medication. 2. Treatment: rest, drink plenty of fluids, advance diet slowly.  Start with water and broth then advance to bland foods that will not upset your stomach such as crackers, mashed potatoes, and peanut butter. 3. Follow Up: Please followup with your primary doctor in 3 days for discussion of your diagnoses and further evaluation after today's visit;Please return to the ER for persistent vomiting, high fevers or worsening symptoms

## 2017-09-05 NOTE — ED Provider Notes (Signed)
[facility] EMERGENCY DEPARTMENT Provider Note   CSN: [id number] Arrival date & time: 09/05/17  1848     History   Chief Complaint Chief Complaint  Patient presents with  . Emesis    HPI Carmen Price is a 27 y.o. female with history of anemia and GERD presents today for evaluation of acute onset of nausea, vomiting, and diarrhea which began yesterday at around 8 PM.  Patient states that yesterday after eating a hot dog from a cafeteria she developed an inability to tolerate any p.o. food or fluids.  She states that every time she tries to eat or drink something she will vomit.  She notes 4 episodes of nonbloody nonbilious emesis and 3 episodes of nonbloody watery stools.  Denies abdominal pain, urinary symptoms, melena, hematochezia, or vaginal itching, bleeding, or discharge.  She has not tried anything for her symptoms prior to arrival but received 1 tablet of Zofran with significant improvement while in the ED. She notes that her godson has similar symptoms.  No recent treatment with antibiotics.   The history is provided by the patient.   Emesis    Associated symptoms include chills and diarrhea. Pertinent negatives include no abdominal pain and no fever.    Past Medical History:  Diagnosis Date  . Anemia   . GERD (gastroesophageal reflux disease)     Patient Active Problem List   Diagnosis Date Noted  . Symptomatic anemia 02/03/2015  . Blood transfusion during current hospitalization 02/03/2015  . CHILDHOOD OBESITY 03/13/2007  . Abnormal uterine bleeding (AUB) 03/13/2007  . ACNE VULGARIS, FACIAL 03/13/2007    Past Surgical History:  Procedure Laterality Date  . TONSILLECTOMY      OB History    Gravida Para Term Preterm AB Living   0 0 0 0 0 0   SAB TAB Ectopic Multiple Live Births   0 0 0 0         Home Medications    Prior to Admission medications   Medication Sig Start Date End Date Taking? Authorizing Provider  clindamycin  (CLINDAGEL) 1 % gel Apply topically 2 (two) times daily. Patient not taking: Reported on 09/05/2017 03/21/17   Loletta Specter, PA-C  ibuprofen (ADVIL,MOTRIN) 600 MG tablet Take 1 tablet (600 mg total) by mouth every 6 (six) hours as needed. Patient not taking: Reported on 09/05/2017 04/03/17   Eber Hong, MD  metroNIDAZOLE (FLAGYL) 500 MG tablet Take 1 tablet (500 mg total) by mouth 2 (two) times daily. Patient not taking: Reported on 09/05/2017 04/03/17   Eber Hong, MD  norethindrone (ORTHO MICRONOR) 0.35 MG tablet Take 1 tablet (0.35 mg total) by mouth daily. Patient not taking: Reported on 03/21/2017 05/27/16   Marylene Land, CNM  ondansetron (ZOFRAN ODT) 4 MG disintegrating tablet Take 1 tablet (4 mg total) by mouth every 8 (eight) hours as needed for up to 3 days for nausea or vomiting. 09/05/17 09/08/17  Jeanie Sewer, PA-C    Family History Family History  Problem Relation Age of Onset  . Hypertension Mother   . Diabetes Father   . Diabetes Maternal Grandmother     Social History Social History   Tobacco Use  . Smoking status: Former Smoker    Packs/day: 0.25    Years: 4.00    Pack years: 1.00    Types: Cigarettes  . Smokeless tobacco: Never Used  Substance Use Topics  . Alcohol use: Yes    Alcohol/week: 0.0 oz  Comment: Social  . Drug use: No     Allergies   Patient has no known allergies.   Review of Systems Review of Systems  Constitutional: Positive for chills. Negative for fever.  Gastrointestinal: Positive for diarrhea, nausea and vomiting. Negative for abdominal pain, blood in stool and constipation.  Genitourinary: Negative for dysuria, hematuria, vaginal bleeding, vaginal discharge and vaginal pain.  All other systems reviewed and are negative.    Physical Exam Updated Vital Signs BP 115/71 (BP Location: Left Arm)   Pulse 77   Temp 98.4 F (36.9 C) (Oral)   Resp 18   Ht 5\' 6"  (1.676 m)   Wt 127 kg (280 lb)   LMP 08/26/2017   SpO2  98%   BMI 45.19 kg/m    Physical Exam  Constitutional: She appears well-developed and well-nourished. No distress.  Resting comfortably in bed  HENT:  Head: Normocephalic and atraumatic.  Moist mucous membrane  Eyes: Conjunctivae are normal. Right eye exhibits no discharge. Left eye exhibits no discharge.  Neck: No JVD present. No tracheal deviation present.  Cardiovascular: Normal rate, regular rhythm and normal heart sounds.  Pulmonary/Chest: Effort normal and breath sounds normal.  Abdominal: Soft. Bowel sounds are normal. She exhibits no distension. There is no tenderness. There is no guarding.  Murphy sign absent, Rovsing's absent, no CVA tenderness bilaterally, no tenderness to palpation at McBurney's point  Musculoskeletal: She exhibits no edema.  Neurological: She is alert.  Skin: Skin is warm and dry. No erythema.  Psychiatric: She has a normal mood and affect. Her behavior is normal.  Nursing note and vitals reviewed.    ED Treatments / Results  Labs (all labs ordered are listed, but only abnormal results are displayed) Labs Reviewed  COMPREHENSIVE METABOLIC PANEL - Abnormal; Notable for the following components:      Result Value   BUN 5 (*)    All other components within normal limits  URINALYSIS, ROUTINE W REFLEX MICROSCOPIC - Abnormal; Notable for the following components:   APPearance HAZY (*)    Ketones, ur 5 (*)    All other components within normal limits  LIPASE, BLOOD  CBC  I-STAT BETA HCG BLOOD, ED ([facility], [facility], [facility] ONLY)    EKG  EKG Interpretation None       Radiology No results found.  Procedures Procedures (including critical care time)  Medications Ordered in ED Medications  ondansetron (ZOFRAN-ODT) disintegrating tablet 4 mg (4 mg Oral Given 09/05/17 1930)     Initial Impression / Assessment and Plan / ED Course  I have reviewed the triage vital signs and the nursing notes.  Pertinent labs & imaging results that were available during my  care of the patient were reviewed by me and considered in my medical decision making (see chart for details).     Patient presents with nausea, vomiting, and diarrhea which began yesterday after eating a hotdog from a cafeteria.  Harlow Mares has similar symptoms.  She is afebrile, vital signs are stable.  She is nontoxic in appearance and exhibits moist mucous membranes.  Examination of the abdomen is entirely benign.  Lab work is reassuring with no leukocytosis or elevation in LFTs, creatinine, lipase, or electrolyte abnormalities.  Lab work does not suggest dehydration and I do not think she requires IV fluids at this time.  Significant improvement with Zofran and on reevaluation she is able to tolerate p.o. fluids without difficulty.  She is resting comfortably and ambulatory without difficulty.  Serial abdominal examinations  remain benign.  I doubt obstruction, perforation, appendicitis, TOA, ovarian torsion, ectopic pregnancy, or other acute surgical abdominal pathology.  Suspect gastroenteritis, likely viral in etiology.  Will discharge with Zofran and instructions to advance diet slowly.  Recommend follow-up with primary care physician if symptoms persist.  Discussed indications for return to the ED. Pt verbalized understanding of and agreement with plan and is safe for discharge home at this time. She has no complaints prior to discharge.  Final Clinical Impressions(s) / ED Diagnoses   Final diagnoses:  Nausea vomiting and diarrhea    ED Discharge Orders        Ordered    ondansetron (ZOFRAN ODT) 4 MG disintegrating tablet  Every 8 hours PRN     09/05/17 2224      Jeanie SewerFawze, Carlton Buskey A, PA-C 09/05/17 2229  Tegeler, Canary Brimhristopher J, MD 09/06/17 0157

## 2017-09-05 NOTE — ED Triage Notes (Signed)
Pt c/o emesis since last night states she is vomiting yellowish secretion, felling hot with chills.

## 2018-05-21 ENCOUNTER — Ambulatory Visit (HOSPITAL_COMMUNITY)
Admission: EM | Admit: 2018-05-21 | Discharge: 2018-05-21 | Disposition: A | Discharge status: Home / Self Care | Payer: Self-pay | Attending: Emergency Medicine | Admitting: Emergency Medicine

## 2018-05-21 ENCOUNTER — Encounter (HOSPITAL_COMMUNITY): Payer: Self-pay | Admitting: Emergency Medicine

## 2018-05-21 DIAGNOSIS — S29012A Strain of muscle and tendon of back wall of thorax, initial encounter: Secondary | ICD-10-CM

## 2018-05-21 DIAGNOSIS — X509XXA Other and unspecified overexertion or strenuous movements or postures, initial encounter: Secondary | ICD-10-CM

## 2018-05-21 MED ORDER — NAPROXEN 500 MG PO TABS: 500.0000 mg | ORAL_TABLET | Freq: Two times a day (BID) | ORAL | 0 refills | Status: DC

## 2018-05-21 MED ORDER — CYCLOBENZAPRINE HCL 5 MG PO TABS: 5.0000 mg | ORAL_TABLET | Freq: Every day | ORAL | 0 refills | Status: DC

## 2018-05-21 NOTE — ED Triage Notes (Signed)
Pt sts upper back pain where she thinks she pulled a muscle on Thursday

## 2018-05-21 NOTE — ED Provider Notes (Signed)
[facility]    CSN: [id number] Arrival date & time: 05/21/18  1543     History   Chief Complaint Chief Complaint  Patient presents with  . Back Pain    HPI Kennieth RadDenera D Ravelo is a [age] y.o. female.   Janah presents with complaints of left thoracic back pain which started after heaving lifting 11/14. She was helping another clean and lifted a box up above her head. Pain has since worsened. Pain 10/10 last night. Worse with movements. Sometimes tingling to left upper arm, which improves with repositioning. No nausea or vomiting, no shortness of breath . Denies any previous similar. Hasn't taken any medications for symptoms. Hx of anemia, gerd.     ROS per HPI.      Past Medical History:  Diagnosis Date  . Anemia   . GERD (gastroesophageal reflux disease)     Patient Active Problem List   Diagnosis Date Noted  . Symptomatic anemia 02/03/2015  . Blood transfusion during current hospitalization 02/03/2015  . CHILDHOOD OBESITY 03/13/2007  . Abnormal uterine bleeding (AUB) 03/13/2007  . ACNE VULGARIS, FACIAL 03/13/2007    Past Surgical History:  Procedure Laterality Date  . TONSILLECTOMY      OB History    Gravida  0   Para  0   Term  0   Preterm  0   AB  0   Living  0     SAB  0   TAB  0   Ectopic  0   Multiple  0   Live Births               Home Medications    Prior to Admission medications   Medication Sig Start Date End Date Taking? Authorizing Provider  cyclobenzaprine (FLEXERIL) 5 MG tablet Take 1 tablet (5 mg total) by mouth at bedtime. 05/21/18   Georgetta HaberBurky, Kadey Mihalic B, NP  naproxen (NAPROSYN) 500 MG tablet Take 1 tablet (500 mg total) by mouth 2 (two) times daily. 05/21/18   Georgetta HaberBurky, Jaelie Aguilera B, NP    Family History Family History  Problem Relation Age of Onset  . Hypertension Mother   . Diabetes Father   . Diabetes Maternal Grandmother     Social History Social History   Tobacco Use  . Smoking status: Former Smoker      Packs/day: 0.25    Years: 4.00    Pack years: 1.00    Types: Cigarettes  . Smokeless tobacco: Never Used  Substance Use Topics  . Alcohol use: Yes    Alcohol/week: 0.0 standard drinks    Comment: Social  . Drug use: No     Allergies   Patient has no known allergies.   Review of Systems Review of Systems   Physical Exam Triage Vital Signs ED Triage Vitals [05/21/18 1643]  Enc Vitals Group     BP (!) 135/94     Pulse Rate 93     Resp 18     Temp 98.3 F (36.8 C)     Temp Source Oral     SpO2 98 %     Weight      Height      Head Circumference      Peak Flow      Pain Score 7     Pain Loc      Pain Edu?      Excl. in GC?    No data found.  Updated Vital Signs BP (!) 135/94 (BP Location:  Left Arm)   Pulse 93   Temp 98.3 F (36.8 C) (Oral)   Resp 18   SpO2 98%   Visual Acuity Right Eye Distance:   Left Eye Distance:   Bilateral Distance:    Right Eye Near:   Left Eye Near:    Bilateral Near:     Physical Exam  Constitutional: She is oriented to person, place, and time. She appears well-developed and well-nourished. No distress.  Cardiovascular: Normal rate, regular rhythm and normal heart sounds.  Pulmonary/Chest: Effort normal and breath sounds normal.  Musculoskeletal:       Thoracic back: She exhibits tenderness and pain. She exhibits normal range of motion, no bony tenderness, no swelling, no edema, no deformity, no laceration, no spasm and normal pulse.  Left thoracic back musculature with tenderness on palpation and with ROM of left upper extremity; no bony pain; strength equal bilaterally; gross sensation intact to upper extremities  Neurological: She is alert and oriented to person, place, and time.  Skin: Skin is warm and dry.     UC Treatments / Results  Labs (all labs ordered are listed, but only abnormal results are displayed) Labs Reviewed - No data to display  EKG None  Radiology No results found.  Procedures Procedures  (including critical care time)  Medications Ordered in UC Medications - No data to display  Initial Impression / Assessment and Plan / UC Course  I have reviewed the triage vital signs and the nursing notes.  Pertinent labs & imaging results that were available during my care of the patient were reviewed by me and considered in my medical decision making (see chart for details).     History and physical consistent with muscle strain and spasm. Naproxen, flexeril as needed, heat, massage. Return precautions provided. If symptoms worsen or do not improve in the next 2 weeks to follow up with PCP.  Patient verbalized understanding and agreeable to plan.   Final Clinical Impressions(s) / UC Diagnoses   Final diagnoses:  Strain of thoracic back region     Discharge Instructions     Light and regular activity and range of motion, stretches. Heat application and massage can be helpful as well.  Naproxen twice a day, take with food, don't take additional ibuprofen.  Flexeril as a muscle relaxer. May cause drowsiness. Please do not take if driving or drinking alcohol.   If symptoms worsen or do not improve in the next 2 weeks to follow up with your PCP.     ED Prescriptions    Medication Sig Dispense Auth. Provider   naproxen (NAPROSYN) 500 MG tablet Take 1 tablet (500 mg total) by mouth 2 (two) times daily. 30 tablet Linus Mako B, NP   cyclobenzaprine (FLEXERIL) 5 MG tablet Take 1 tablet (5 mg total) by mouth at bedtime. 15 tablet Georgetta Haber, NP     Controlled Substance Prescriptions [place] Controlled Substance Registry consulted? Not Applicable   Georgetta Haber, NP 05/21/18 1735

## 2018-05-21 NOTE — Discharge Instructions (Addendum)
Light and regular activity and range of motion, stretches. Heat application and massage can be helpful as well.  Naproxen twice a day, take with food, don't take additional ibuprofen.  Flexeril as a muscle relaxer. May cause drowsiness. Please do not take if driving or drinking alcohol.   If symptoms worsen or do not improve in the next 2 weeks to follow up with your PCP.

## 2018-07-18 ENCOUNTER — Encounter (HOSPITAL_COMMUNITY): Payer: Self-pay | Admitting: Emergency Medicine

## 2018-07-18 ENCOUNTER — Emergency Department (HOSPITAL_COMMUNITY)
Admission: EM | Admit: 2018-07-18 | Discharge: 2018-07-18 | Disposition: A | Discharge status: Home / Self Care | Payer: Self-pay | Attending: Emergency Medicine | Admitting: Emergency Medicine

## 2018-07-18 DIAGNOSIS — L0291 Cutaneous abscess, unspecified: Secondary | ICD-10-CM

## 2018-07-18 DIAGNOSIS — N611 Abscess of the breast and nipple: Secondary | ICD-10-CM | POA: Insufficient documentation

## 2018-07-18 DIAGNOSIS — Z79899 Other long term (current) drug therapy: Secondary | ICD-10-CM | POA: Insufficient documentation

## 2018-07-18 NOTE — Discharge Instructions (Signed)
Evaluated today for possible breast abscess.  Please follow-up with the breast center of [facility].

## 2018-07-18 NOTE — ED Provider Notes (Signed)
[facility] Provider Note   CSN: [id number] Arrival date & time: 07/18/18  [id number]   History   Chief Complaint Chief Complaint  Patient presents with  . Recurrent Skin Infections    HPI Carmen Price is a 28 y.o. female with past medical history significant for obesity who presents for evaluation of right breast mass.  Patient states she has noted a tender area below her nipple on her right breast.  Has been there for 2 days.  Patient states area is becoming more tender and she is getting "a hard spot around this."  Denies fever, chills, nausea, vomiting, chest pain, shortness of breath, diarrhea, dysuria or abdominal pain.  Patient denies nipple discharge, inverted nipples, redness, warmth. Denies previous history of abscess.  Patient rates her pain a 6/10.  Pain does not radiate.  Describes pain as throbbing.  Not take anything for symptoms PTA.  Denies additional aggravating or alleviating factors.  History obtained from patient.  No interpreter was used.  HPI  Past Medical History:  Diagnosis Date  . Anemia   . GERD (gastroesophageal reflux disease)     Patient Active Problem List   Diagnosis Date Noted  . Symptomatic anemia 02/03/2015  . Blood transfusion during current hospitalization 02/03/2015  . CHILDHOOD OBESITY 03/13/2007  . Abnormal uterine bleeding (AUB) 03/13/2007  . ACNE VULGARIS, FACIAL 03/13/2007    Past Surgical History:  Procedure Laterality Date  . TONSILLECTOMY       OB History    Gravida  0   Para  0   Term  0   Preterm  0   AB  0   Living  0     SAB  0   TAB  0   Ectopic  0   Multiple  0   Live Births               Home Medications    Prior to Admission medications   Medication Sig Start Date End Date Taking? Authorizing Provider  cyclobenzaprine (FLEXERIL) 5 MG tablet Take 1 tablet (5 mg total) by mouth at bedtime. 05/21/18   Georgetta HaberBurky, Natalie B, NP  naproxen (NAPROSYN) 500 MG  tablet Take 1 tablet (500 mg total) by mouth 2 (two) times daily. 05/21/18   Georgetta HaberBurky, Natalie B, NP    Family History Family History  Problem Relation Age of Onset  . Hypertension Mother   . Diabetes Father   . Diabetes Maternal Grandmother     Social History Social History   Tobacco Use  . Smoking status: Former Smoker    Packs/day: 0.25    Years: 4.00    Pack years: 1.00    Types: Cigarettes  . Smokeless tobacco: Never Used  Substance Use Topics  . Alcohol use: Yes    Alcohol/week: 0.0 standard drinks    Comment: Social  . Drug use: No     Allergies   Patient has no known allergies.   Review of Systems Review of Systems  Constitutional: Negative.   HENT: Negative.   Respiratory: Negative.   Cardiovascular: Negative.   Gastrointestinal: Negative.   Genitourinary:       Right breast pain  Musculoskeletal: Negative.   Skin: Negative.   Neurological: Negative.   All other systems reviewed and are negative.    Physical Exam Updated Vital Signs BP 124/84 (BP Location: Right Arm)   Pulse 87   Temp 98.6 F (37 C) (Oral)   Resp 16  SpO2 99%   Physical Exam Vitals signs and nursing note reviewed. Exam conducted with a chaperone present.  Constitutional:      General: She is not in acute distress.    Appearance: She is well-developed. She is not ill-appearing or toxic-appearing.  HENT:     Head: Normocephalic and atraumatic.     Nose: Nose normal.     Mouth/Throat:     Mouth: Mucous membranes are moist.     Pharynx: Oropharynx is clear.  Eyes:     Pupils: Pupils are equal, round, and reactive to light.  Neck:     Musculoskeletal: Normal range of motion.  Cardiovascular:     Rate and Rhythm: Normal rate.     Pulses: Normal pulses.     Heart sounds: Normal heart sounds. No murmur. No friction rub. No gallop.   Pulmonary:     Effort: No respiratory distress.     Comments: Clear to auscultation bilaterally without wheeze, rhonchi or rales. Chest:      Breasts:        Right: Skin change and tenderness present. No bleeding, inverted nipple or nipple discharge.        Left: No bleeding, inverted nipple, nipple discharge, skin change or tenderness.     Comments: 2 cm area of fluctuation just inferior to nipple on right breast.  Mild surrounding induration which measures 5 cm.  No overlying erythema.  No nipple discharge.  No axillary lymphadenopathy Abdominal:     General: There is no distension.     Comments: Soft, nontender without rebound or guarding.  Musculoskeletal: Normal range of motion.  Lymphadenopathy:     Upper Body:     Right upper body: No axillary adenopathy.     Left upper body: No axillary adenopathy.  Skin:    General: Skin is warm and dry.     Comments: No erythema, warmth or ecchymosis to overlying skin.  Is area of fluctuance and induration to right breast just inferior to right nipple.  Neurological:     Mental Status: She is alert.      ED Treatments / Results  Labs (all labs ordered are listed, but only abnormal results are displayed) Labs Reviewed - No data to display  EKG None  Radiology No results found.  Procedures Procedures (including critical care time)  Medications Ordered in ED Medications - No data to display   Initial Impression / Assessment and Plan / ED Course  I have reviewed the triage vital signs and the nursing notes.  Pertinent labs & imaging results that were available during my care of the patient were reviewed by me and considered in my medical decision making (see chart for details).  28 year old female who appears otherwise well presents for evaluation of right breast abscess.  Afebrile, nonseptic, non-ill-appearing.  Lesion becoming increasingly painful.  No overlying erythema, warmth.  There is an area of fluctuance and induration just inferior to right nipple.  Patient does not have PCP follow-up.  Patient will need drainage of breast abscess.  Will consult with case  management to establish PCP and have patient follow-up with breast clinic for drainage.  Patient management has set up PCP follow-up for patient.  Discussed with patient follow-up with breast clinic for drainage of her abscess.  She states she is not sure if she has a ride for an appointment today however she states she will follow-up as soon as possible.  She is hemodynamically stable and appropriate for DC home at  this time.  Discussed strict return precautions and encouraged follow-up with her newly established PCP.  Patient voiced understanding of return precautions and is agreeable for follow-up.    Final Clinical Impressions(s) / ED Diagnoses   Final diagnoses:  Abscess    ED Discharge Orders    None       Kaimani Clayson A, PA-C 07/18/18 1146    Arby BarrettePfeiffer, Marcy, MD 07/22/18 0010

## 2018-07-18 NOTE — ED Triage Notes (Signed)
Pt reports painful swollen lesion under R breast x2 days, denies drainage from lesion, no fevers or chills.

## 2018-07-18 NOTE — Discharge Planning (Signed)
Kollins Fenter J. Lucretia RoersWood, RN, BSN, [place] [phone]  [facility]oger [facility] @ 2:30.  Spoke with pt at bedside and advised to please arrive 15 min early and take a picture ID and your current medications.  Pt verbalizes understanding of keeping appointment.

## 2018-07-19 ENCOUNTER — Encounter (HOSPITAL_COMMUNITY): Payer: Self-pay

## 2018-07-19 ENCOUNTER — Ambulatory Visit (HOSPITAL_COMMUNITY)
Admission: EM | Admit: 2018-07-19 | Discharge: 2018-07-19 | Disposition: A | Discharge status: Home / Self Care | Payer: Self-pay | Attending: Family Medicine | Admitting: Family Medicine

## 2018-07-19 DIAGNOSIS — N644 Mastodynia: Secondary | ICD-10-CM

## 2018-07-19 DIAGNOSIS — N611 Abscess of the breast and nipple: Secondary | ICD-10-CM

## 2018-07-19 DIAGNOSIS — L0291 Cutaneous abscess, unspecified: Secondary | ICD-10-CM | POA: Insufficient documentation

## 2018-07-19 MED ORDER — SULFAMETHOXAZOLE-TRIMETHOPRIM 800-160 MG PO TABS: 1.0000 | ORAL_TABLET | Freq: Two times a day (BID) | ORAL | 0 refills | Status: AC

## 2018-07-19 NOTE — Discharge Instructions (Addendum)
Warm compresses Warmth to area Take antibiotic 2 x a day Take 2 doses today See your PCP as scheduled

## 2018-07-19 NOTE — ED Provider Notes (Signed)
[facility]    CSN: [id number] Arrival date & time: 07/19/18  0940     History   Chief Complaint Chief Complaint  Patient presents with  . Breast Problem    Right     HPI Carmen Price is a 28 y.o. female.   HPI  Patient presented to the emergency room yesterday with a abscess underneath her right breast.  She is unhappy that it was not cared for at the time of the visit.  She states that she was not prescribed antibiotics.  She was not offered an I&D.  She is here to have this cared for.  As I reviewed the medical record from the emergency room she was given follow-up with the breast center for aspiration.  She was given referral for PCP.  It looks like medicine was prescribed for her although she did not pick it up at the pharmacy.  I explained to her that she had at least partially misunderstanding regarding the intended care from the emergency room Patient states she could not go to work today because of the pain.  She states that she has had this before.  She is hoping that she can have it drained.  Past Medical History:  Diagnosis Date  . Anemia   . GERD (gastroesophageal reflux disease)     Patient Active Problem List   Diagnosis Date Noted  . Symptomatic anemia 02/03/2015  . Blood transfusion during current hospitalization 02/03/2015  . CHILDHOOD OBESITY 03/13/2007  . Abnormal uterine bleeding (AUB) 03/13/2007  . ACNE VULGARIS, FACIAL 03/13/2007    Past Surgical History:  Procedure Laterality Date  . TONSILLECTOMY      OB History    Gravida  0   Para  0   Term  0   Preterm  0   AB  0   Living  0     SAB  0   TAB  0   Ectopic  0   Multiple  0   Live Births               Home Medications    Prior to Admission medications   Medication Sig Start Date End Date Taking? Authorizing Provider  sulfamethoxazole-trimethoprim (BACTRIM DS,SEPTRA DS) 800-160 MG tablet Take 1 tablet by mouth 2 (two) times daily for 7 days. 07/19/18  07/26/18  Eustace Moore, MD    Family History Family History  Problem Relation Age of Onset  . Hypertension Mother   . Diabetes Father   . Diabetes Maternal Grandmother     Social History Social History   Tobacco Use  . Smoking status: Former Smoker    Packs/day: 0.25    Years: 4.00    Pack years: 1.00    Types: Cigarettes  . Smokeless tobacco: Never Used  Substance Use Topics  . Alcohol use: Yes    Alcohol/week: 0.0 standard drinks    Comment: Social  . Drug use: No     Allergies   Patient has no known allergies.   Review of Systems Review of Systems  Constitutional: Negative for chills and fever.  HENT: Negative for ear pain and sore throat.   Eyes: Negative for pain and visual disturbance.  Respiratory: Negative for cough and shortness of breath.   Cardiovascular: Negative for chest pain and palpitations.  Gastrointestinal: Negative for abdominal pain and vomiting.  Genitourinary: Negative for dysuria and hematuria.  Musculoskeletal: Negative for arthralgias and back pain.  Skin: Positive for wound. Negative  for color change and rash.  Neurological: Negative for seizures and syncope.  All other systems reviewed and are negative.    Physical Exam Triage Vital Signs ED Triage Vitals  Enc Vitals Group     BP 07/19/18 1000 118/71     Pulse Rate 07/19/18 1000 75     Resp 07/19/18 1000 16     Temp 07/19/18 1000 98.3 F (36.8 C)     Temp Source 07/19/18 1000 Oral     SpO2 07/19/18 1000 100 %     Weight --      Height --      Head Circumference --      Peak Flow --      Pain Score 07/19/18 1001 10     Pain Loc --      Pain Edu? --      Excl. in GC? --    No data found.  Updated Vital Signs BP 118/71 (BP Location: Right Arm)   Pulse 75   Temp 98.3 F (36.8 C) (Oral)   Resp 16   LMP 06/15/2018   SpO2 100%      Physical Exam Constitutional:      General: She is not in acute distress.    Appearance: She is well-developed.  HENT:      Head: Normocephalic and atraumatic.  Eyes:     Conjunctiva/sclera: Conjunctivae normal.     Pupils: Pupils are equal, round, and reactive to light.  Neck:     Musculoskeletal: Normal range of motion.  Cardiovascular:     Rate and Rhythm: Normal rate.  Pulmonary:     Effort: Pulmonary effort is normal. No respiratory distress.  Abdominal:     General: There is no distension.     Palpations: Abdomen is soft.  Musculoskeletal: Normal range of motion.  Skin:    General: Skin is warm and dry.     Comments: Underneath right breast there is a 3 cm indurated nodule, hyperpigmented skin, fluctuant 1 cm center.    Neurological:     Mental Status: She is alert.      UC Treatments / Results  Labs (all labs ordered are listed, but only abnormal results are displayed) Labs Reviewed - No data to display  EKG None  Radiology No results found.  Procedures Incision and Drainage Date/Time: 07/19/2018 9:30 PM Performed by: Eustace Moore, MD Authorized by: Eustace Moore, MD   Consent:    Consent obtained:  Verbal   Consent given by:  Patient   Risks discussed:  Bleeding and incomplete drainage   Alternatives discussed:  No treatment and delayed treatment Location:    Type:  Abscess   Location:  Trunk   Trunk location:  R breast Pre-procedure details:    Skin preparation:  Betadine Anesthesia (see MAR for exact dosages):    Anesthesia method:  Local infiltration   Local anesthetic:  Lidocaine 1% w/o epi Procedure type:    Complexity:  Simple Procedure details:    Needle aspiration: yes     Needle size:  18 G   Incision depth:  Subcutaneous   Scalpel blade:  11   Drainage:  Purulent   Drainage amount:  Scant Post-procedure details:    Patient tolerance of procedure:  Tolerated well, no immediate complications Comments:     10 cc of bloody purulent fluid was aspirated from abscess, after this a small stab wound was made to express a scant amount more.  Patient  tolerated well.   (  including critical care time)  Medications Ordered in UC Medications - No data to display  Initial Impression / Assessment and Plan / UC Course  I have reviewed the triage vital signs and the nursing notes.  Pertinent labs & imaging results that were available during my care of the patient were reviewed by me and considered in my medical decision making (see chart for details).      Final Clinical Impressions(s) / UC Diagnoses   Final diagnoses:  Abscess     Discharge Instructions     Warm compresses Warmth to area Take antibiotic 2 x a day Take 2 doses today See your PCP as scheduled   ED Prescriptions    Medication Sig Dispense Auth. Provider   sulfamethoxazole-trimethoprim (BACTRIM DS,SEPTRA DS) 800-160 MG tablet Take 1 tablet by mouth 2 (two) times daily for 7 days. 14 tablet Eustace MooreNelson, Rehan Holness Sue, MD     Controlled Substance Prescriptions [place] Controlled Substance Registry consulted? Not Applicable   Eustace MooreNelson, Jashan Cotten Sue, MD 07/19/18 2131

## 2018-08-08 ENCOUNTER — Inpatient Hospital Stay (INDEPENDENT_AMBULATORY_CARE_PROVIDER_SITE_OTHER): Payer: Self-pay | Admitting: Primary Care

## 2019-03-26 ENCOUNTER — Ambulatory Visit (HOSPITAL_COMMUNITY)
Admission: EM | Admit: 2019-03-26 | Discharge: 2019-03-26 | Disposition: A | Discharge status: Home / Self Care | Payer: Self-pay | Attending: Family Medicine | Admitting: Family Medicine

## 2019-03-26 ENCOUNTER — Encounter (HOSPITAL_COMMUNITY): Payer: Self-pay

## 2019-03-26 ENCOUNTER — Other Ambulatory Visit: Payer: Self-pay

## 2019-03-26 DIAGNOSIS — Z20828 Contact with and (suspected) exposure to other viral communicable diseases: Secondary | ICD-10-CM | POA: Insufficient documentation

## 2019-03-26 DIAGNOSIS — R030 Elevated blood-pressure reading, without diagnosis of hypertension: Secondary | ICD-10-CM | POA: Insufficient documentation

## 2019-03-26 DIAGNOSIS — R310 Gross hematuria: Secondary | ICD-10-CM | POA: Insufficient documentation

## 2019-03-26 DIAGNOSIS — R809 Proteinuria, unspecified: Secondary | ICD-10-CM | POA: Insufficient documentation

## 2019-03-26 DIAGNOSIS — R112 Nausea with vomiting, unspecified: Secondary | ICD-10-CM | POA: Insufficient documentation

## 2019-03-26 DIAGNOSIS — R822 Biliuria: Secondary | ICD-10-CM | POA: Insufficient documentation

## 2019-03-26 DIAGNOSIS — Z3202 Encounter for pregnancy test, result negative: Secondary | ICD-10-CM

## 2019-03-26 DIAGNOSIS — R5383 Other fatigue: Secondary | ICD-10-CM | POA: Insufficient documentation

## 2019-03-26 DIAGNOSIS — R1011 Right upper quadrant pain: Secondary | ICD-10-CM | POA: Insufficient documentation

## 2019-03-26 LAB — COMPREHENSIVE METABOLIC PANEL
ALT: 17 U/L (ref 0–44)
AST: 17 U/L (ref 15–41)
Albumin: 4.3 g/dL (ref 3.5–5.0)
Alkaline Phosphatase: 65 U/L (ref 38–126)
Anion gap: 12 (ref 5–15)
BUN: 8 mg/dL (ref 6–20)
CO2: 26 mmol/L (ref 22–32)
Calcium: 9.6 mg/dL (ref 8.9–10.3)
Chloride: 100 mmol/L (ref 98–111)
Creatinine, Ser: 0.71 mg/dL (ref 0.44–1.00)
GFR calc Af Amer: >60 mL/min (ref >60)
GFR calc non Af Amer: >60 mL/min (ref >60)
Glucose, Bld: 128 mg/dL — ABNORMAL HIGH (ref 70–99)
Potassium: 3.3 mmol/L — ABNORMAL LOW (ref 3.5–5.1)
Sodium: 138 mmol/L (ref 135–145)
Total Bilirubin: 0.6 mg/dL (ref 0.3–1.2)
Total Protein: 8.9 g/dL — ABNORMAL HIGH (ref 6.5–8.1)

## 2019-03-26 LAB — POCT URINALYSIS DIP (DEVICE)
Glucose, UA: NEGATIVE mg/dL
Ketones, ur: 15 mg/dL — AB
Leukocytes,Ua: NEGATIVE
Nitrite: NEGATIVE
Protein, ur: >=300 mg/dL — AB
Specific Gravity, Urine: >=1.03 (ref 1.005–1.030)
Urobilinogen, UA: 0.2 mg/dL (ref 0.0–1.0)
pH: 6 (ref 5.0–8.0)

## 2019-03-26 LAB — CBC WITH DIFFERENTIAL/PLATELET
Abs Immature Granulocytes: 0.03 10*3/uL (ref 0.00–0.07)
Basophils Absolute: 0 10*3/uL (ref 0.0–0.1)
Basophils Relative: 0 %
Eosinophils Absolute: 0 10*3/uL (ref 0.0–0.5)
Eosinophils Relative: 0 %
HCT: 44.2 % (ref 36.0–46.0)
Hemoglobin: 15.2 g/dL — ABNORMAL HIGH (ref 12.0–15.0)
Immature Granulocytes: 0 %
Lymphocytes Relative: 14 %
Lymphs Abs: 1.3 10*3/uL (ref 0.7–4.0)
MCH: 32.1 pg (ref 26.0–34.0)
MCHC: 34.4 g/dL (ref 30.0–36.0)
MCV: 93.2 fL (ref 80.0–100.0)
Monocytes Absolute: 0.5 10*3/uL (ref 0.1–1.0)
Monocytes Relative: 6 %
Neutro Abs: 7.4 10*3/uL (ref 1.7–7.7)
Neutrophils Relative %: 80 %
Platelets: 312 10*3/uL (ref 150–400)
RBC: 4.74 MIL/uL (ref 3.87–5.11)
RDW: 12.5 % (ref 11.5–15.5)
WBC: 9.3 10*3/uL (ref 4.0–10.5)
nRBC: 0 % (ref 0.0–0.2)

## 2019-03-26 LAB — POCT PREGNANCY, URINE: Preg Test, Ur: NEGATIVE

## 2019-03-26 MED ORDER — ONDANSETRON 4 MG PO TBDP: 4.0000 mg | ORAL_TABLET | Freq: Three times a day (TID) | ORAL | 0 refills | Status: DC | PRN

## 2019-03-26 MED ORDER — ONDANSETRON 4 MG PO TBDP
4.0000 mg | ORAL_TABLET | Freq: Once | ORAL | Status: AC
  Administered 2019-03-26: 12:00:00 4 mg via ORAL

## 2019-03-26 MED ORDER — ONDANSETRON 4 MG PO TBDP
ORAL_TABLET | ORAL | Status: AC
  Filled 2019-03-26: qty 1

## 2019-03-26 NOTE — ED Triage Notes (Signed)
Pt presents with generalized abdominal pain and vomiting since yesterday. 

## 2019-03-26 NOTE — Discharge Instructions (Signed)

## 2019-03-26 NOTE — ED Provider Notes (Signed)
[place]   [id number] 03/26/19 Arrival Time: 5852  ASSESSMENT & PLAN:  1. Right upper quadrant abdominal pain   2. Intractable vomiting with nausea, unspecified vomiting type   3. Elevated blood pressure reading without diagnosis of hypertension     Benign abdominal exam. No indications for urgent abdominal/pelvic imaging at this time. Discussed. Return/ED precautions given.  Will do her best to ensure adequate fluid intake in order to avoid dehydration. CBC/CMP pending. U/A with gross hematuria, bilirubinuria, and significant proteinuria.  Meds ordered this encounter  Medications  . ondansetron (ZOFRAN-ODT) disintegrating tablet 4 mg  . ondansetron (ZOFRAN-ODT) 4 MG disintegrating tablet    Sig: Take 1 tablet (4 mg total) by mouth every 8 (eight) hours as needed for nausea or vomiting.    Dispense:  15 tablet    Refill:  0     Discharge Instructions     You have been seen today for abdominal pain. Your evaluation was not suggestive of any emergent condition requiring medical intervention at this time. However, some abdominal problems make take more time to appear. Therefore, it is very important for you to pay attention to any new symptoms or worsening of your current condition.  Please return here or to the Emergency Department immediately should you begin to feel worse in any way or have any of the following symptoms: increasing or different abdominal pain, persistent vomiting, inability to drink fluids, fevers, or shaking chills.     Follow-up Information    [facility].   Specialty: Emergency Medicine Why: If symptoms worsen in any way. Contact information: [address] [id number] [place] [phone]         Recommend rechecking BP after current symptoms resolve; here or with PCP.  Reviewed expectations re: course of current medical issues. Questions answered. Outlined  signs and symptoms indicating need for more acute intervention. Patient verbalized understanding. After Visit Summary given.   SUBJECTIVE: History from: patient. Carmen Price is a 28 y.o. female who presents with complaint of intermittent RUQ abdominal pain with sporadic n/v beginning this morning. No diarrhea. Onset gradual, approx 24 hours ago. Discomfort described as aching and dull; without radiation. Symptoms are on/off since beginning; lasts several minutes when present with gradual resolution. Fever: absent. Aggravating factors: have not been identified. Alleviating factors: have not been identified. Associated symptoms: fatigue. She denies arthralgias, belching, chills, constipation, fever, headache, hematuria, myalgias and sweats. Appetite: decreased. PO intake: decreased. Ambulatory without assistance. Denies: urinary frequency, hematuria and dysuria. Bowel movements: have not significantly changed; last bowel movement within the past 1-2 days and without blood. History of similar: "Usually get a stomach virus once a year".. OTC treatment: none.  Patient's last menstrual period was 03/22/2019.   Past Surgical History:  Procedure Laterality Date  . TONSILLECTOMY     Increased blood pressure noted today. Reports that she has not been treated for hypertension in the past.  She reports no chest pain on exertion, no dyspnea on exertion, no swelling of ankles, no orthostatic dizziness or lightheadedness, no orthopnea or paroxysmal nocturnal dyspnea, no palpitations and no intermittent claudication symptoms.  ROS: As per HPI. All other systems negative.  OBJECTIVE:  Vitals:   03/26/19 1203  BP: (!) 165/117  Pulse: 85  Resp: 16  Temp: 98.1 F (36.7 C)  TempSrc: Oral  SpO2: 96%    General appearance: alert, oriented, no acute distress; appears fatigued HEENT: [place]; AT; oropharynx moist Lungs: clear  to auscultation bilaterally; unlabored respirations Heart: regular rate and  rhythm Abdomen: obese; soft; without distention; mild tenderness to palpation over RUQ; normal bowel sounds; without masses or organomegaly; without guarding or rebound tenderness Back: without CVA tenderness; FROM at waist Extremities: without LE edema; symmetrical; without gross deformities Skin: warm and dry Neurologic: normal gait Psychological: alert and cooperative; normal mood and affect  Labs: Results for orders placed or performed during the hospital encounter of 03/26/19  POCT urinalysis dip (device)  Result Value Ref Range   Glucose, UA NEGATIVE NEGATIVE mg/dL   Bilirubin Urine MODERATE (A) NEGATIVE   Ketones, ur 15 (A) NEGATIVE mg/dL   Specific Gravity, Urine >=1.030 1.005 - 1.030   Hgb urine dipstick LARGE (A) NEGATIVE   pH 6.0 5.0 - 8.0   Protein, ur >=300 (A) NEGATIVE mg/dL   Urobilinogen, UA 0.2 0.0 - 1.0 mg/dL   Nitrite NEGATIVE NEGATIVE   Leukocytes,Ua NEGATIVE NEGATIVE  Pregnancy, urine POC  Result Value Ref Range   Preg Test, Ur NEGATIVE NEGATIVE   Labs Reviewed  POCT URINALYSIS DIP (DEVICE) - Abnormal; Notable for the following components:      Result Value   Bilirubin Urine MODERATE (*)    Ketones, ur 15 (*)    Hgb urine dipstick LARGE (*)    Protein, ur >=300 (*)    All other components within normal limits  NOVEL CORONAVIRUS, NAA (HOSP ORDER, SEND-OUT TO REF LAB; TAT 18-24 HRS)  CBC WITH DIFFERENTIAL/PLATELET  COMPREHENSIVE METABOLIC PANEL  POC URINE PREG, ED  POCT PREGNANCY, URINE    No Known Allergies                                             Past Medical History:  Diagnosis Date  . Anemia   . GERD (gastroesophageal reflux disease)    Social History   Socioeconomic History  . Marital status: Single    Spouse name: Not on file  . Number of children: Not on file  . Years of education: Not on file  . Highest education level: Not on file  Occupational History  . Not on file  Social Needs  . Financial resource strain: Not on file  .  Food insecurity    Worry: Not on file    Inability: Not on file  . Transportation needs    Medical: Not on file    Non-medical: Not on file  Tobacco Use  . Smoking status: Former Smoker    Packs/day: 0.25    Years: 4.00    Pack years: 1.00    Types: Cigarettes  . Smokeless tobacco: Never Used  Substance and Sexual Activity  . Alcohol use: Yes    Alcohol/week: 0.0 standard drinks    Comment: Social  . Drug use: No  . Sexual activity: Not on file  Lifestyle  . Physical activity    Days per week: Not on file    Minutes per session: Not on file  . Stress: Not on file  Relationships  . Social Musician on phone: Not on file    Gets together: Not on file    Attends religious service: Not on file    Active member of club or organization: Not on file    Attends meetings of clubs or organizations: Not on file    Relationship status: Not on file  .  Intimate partner violence    Fear of current or ex partner: Not on file    Emotionally abused: Not on file    Physically abused: Not on file    Forced sexual activity: Not on file  Other Topics Concern  . Not on file  Social History Narrative  . Not on file   Family History  Problem Relation Age of Onset  . Hypertension Mother   . Diabetes Father   . Diabetes Maternal Dulce Sellar, MD 03/26/19 1311

## 2019-03-27 ENCOUNTER — Emergency Department (HOSPITAL_COMMUNITY)
Admission: EM | Admit: 2019-03-27 | Discharge: 2019-03-27 | Disposition: A | Discharge status: Home / Self Care | Payer: Self-pay | Attending: Emergency Medicine | Admitting: Emergency Medicine

## 2019-03-27 ENCOUNTER — Encounter (HOSPITAL_COMMUNITY): Payer: Self-pay | Admitting: *Deleted

## 2019-03-27 ENCOUNTER — Other Ambulatory Visit: Payer: Self-pay

## 2019-03-27 DIAGNOSIS — Z87891 Personal history of nicotine dependence: Secondary | ICD-10-CM | POA: Insufficient documentation

## 2019-03-27 DIAGNOSIS — R112 Nausea with vomiting, unspecified: Secondary | ICD-10-CM | POA: Insufficient documentation

## 2019-03-27 LAB — CBC WITH DIFFERENTIAL/PLATELET
Abs Immature Granulocytes: 0.03 10*3/uL (ref 0.00–0.07)
Basophils Absolute: 0 10*3/uL (ref 0.0–0.1)
Basophils Relative: 0 %
Eosinophils Absolute: 0 10*3/uL (ref 0.0–0.5)
Eosinophils Relative: 0 %
HCT: 45.8 % (ref 36.0–46.0)
Hemoglobin: 14.8 g/dL (ref 12.0–15.0)
Immature Granulocytes: 0 %
Lymphocytes Relative: 24 %
Lymphs Abs: 1.9 10*3/uL (ref 0.7–4.0)
MCH: 31.1 pg (ref 26.0–34.0)
MCHC: 32.3 g/dL (ref 30.0–36.0)
MCV: 96.2 fL (ref 80.0–100.0)
Monocytes Absolute: 0.7 10*3/uL (ref 0.1–1.0)
Monocytes Relative: 8 %
Neutro Abs: 5.4 10*3/uL (ref 1.7–7.7)
Neutrophils Relative %: 68 %
Platelets: 332 10*3/uL (ref 150–400)
RBC: 4.76 MIL/uL (ref 3.87–5.11)
RDW: 12.6 % (ref 11.5–15.5)
WBC: 7.9 10*3/uL (ref 4.0–10.5)
nRBC: 0 % (ref 0.0–0.2)

## 2019-03-27 LAB — URINALYSIS, ROUTINE W REFLEX MICROSCOPIC
Bilirubin Urine: NEGATIVE
Glucose, UA: NEGATIVE mg/dL
Ketones, ur: 20 mg/dL — AB
Nitrite: NEGATIVE
Protein, ur: 30 mg/dL — AB
Specific Gravity, Urine: 1.02 (ref 1.005–1.030)
pH: 6 (ref 5.0–8.0)

## 2019-03-27 LAB — LIPASE, BLOOD: Lipase: 21 U/L (ref 11–51)

## 2019-03-27 LAB — NOVEL CORONAVIRUS, NAA (HOSP ORDER, SEND-OUT TO REF LAB; TAT 18-24 HRS): SARS-CoV-2, NAA: NOT DETECTED

## 2019-03-27 LAB — COMPREHENSIVE METABOLIC PANEL
ALT: 15 U/L (ref 0–44)
AST: 18 U/L (ref 15–41)
Albumin: 4.3 g/dL (ref 3.5–5.0)
Alkaline Phosphatase: 67 U/L (ref 38–126)
Anion gap: 11 (ref 5–15)
BUN: 11 mg/dL (ref 6–20)
CO2: 27 mmol/L (ref 22–32)
Calcium: 9.4 mg/dL (ref 8.9–10.3)
Chloride: 100 mmol/L (ref 98–111)
Creatinine, Ser: 0.72 mg/dL (ref 0.44–1.00)
GFR calc Af Amer: >60 mL/min (ref >60)
GFR calc non Af Amer: >60 mL/min (ref >60)
Glucose, Bld: 93 mg/dL (ref 70–99)
Potassium: 3.2 mmol/L — ABNORMAL LOW (ref 3.5–5.1)
Sodium: 138 mmol/L (ref 135–145)
Total Bilirubin: 0.6 mg/dL (ref 0.3–1.2)
Total Protein: 8.8 g/dL — ABNORMAL HIGH (ref 6.5–8.1)

## 2019-03-27 MED ORDER — SODIUM CHLORIDE 0.9 % IV BOLUS
1000.0000 mL | Freq: Once | INTRAVENOUS | Status: AC
  Administered 2019-03-27: 1000 mL via INTRAVENOUS

## 2019-03-27 MED ORDER — METOCLOPRAMIDE HCL 5 MG/ML IJ SOLN
10.0000 mg | Freq: Once | INTRAMUSCULAR | Status: AC
  Administered 2019-03-27: 10 mg via INTRAVENOUS
  Filled 2019-03-27: qty 2

## 2019-03-27 MED ORDER — POTASSIUM CHLORIDE CRYS ER 20 MEQ PO TBCR
40.0000 meq | EXTENDED_RELEASE_TABLET | Freq: Once | ORAL | Status: AC
  Administered 2019-03-27: 16:00:00 40 meq via ORAL
  Filled 2019-03-27: qty 2

## 2019-03-27 NOTE — ED Triage Notes (Signed)
To ED for further eval of nausea and vomiting. Seen at [facility] yesterday for same and given zofran otd. Not feeling better today so came to ED. Appears in nad. Denies diarrhea.

## 2019-03-27 NOTE — ED Notes (Signed)
Patient Alert and oriented to baseline. Stable and ambulatory to baseline. Patient verbalized understanding of the discharge instructions.  Patient belongings were taken by the patient.   

## 2019-03-27 NOTE — ED Provider Notes (Signed)
[facility] EMERGENCY DEPARTMENT Provider Note   CSN: [id number] Arrival date & time: 03/27/19  9892     History   Chief Complaint Chief Complaint  Patient presents with  . Nausea  . Emesis    HPI Carmen Price is a 28 y.o. female.     The history is provided by the patient and medical records. No language interpreter was used.  Emesis    28 year old female with history of anemia, GERD, obesity, presenting for evaluation of nausea and vomiting.  Patient report since yesterday she has been endorsing nausea, and has vomited approximately 10 separate times of nonbloody nonbilious vomiting.  States that she feels hungry.  Afraid to eat.  She did endorse mild upper abdominal discomfort yesterday but none since.  She does not complain of any associated fever or chills no chest pain shortness of breath productive cough back pain dysuria no rash.  She denies any change in bowel or bladder habits, no constipation or diarrhea.  Last bowel movement was this morning.  She was seen at urgent care yesterday for her symptoms, was given Zofran however medication did not help today prompting her to visit the ED.  She mentioned yearly she would develop "stomach bug" and this felt similar.  She denies any anyone else with similar symptoms.  Past Medical History:  Diagnosis Date  . Anemia   . GERD (gastroesophageal reflux disease)     Patient Active Problem List   Diagnosis Date Noted  . Symptomatic anemia 02/03/2015  . Blood transfusion during current hospitalization 02/03/2015  . CHILDHOOD OBESITY 03/13/2007  . Abnormal uterine bleeding (AUB) 03/13/2007  . ACNE VULGARIS, FACIAL 03/13/2007    Past Surgical History:  Procedure Laterality Date  . TONSILLECTOMY       OB History    Gravida  0   Para  0   Term  0   Preterm  0   AB  0   Living  0     SAB  0   TAB  0   Ectopic  0   Multiple  0   Live Births               Home Medications     Prior to Admission medications   Medication Sig Start Date End Date Taking? Authorizing Provider  ondansetron (ZOFRAN-ODT) 4 MG disintegrating tablet Take 1 tablet (4 mg total) by mouth every 8 (eight) hours as needed for nausea or vomiting. 03/26/19   Vanessa Kick, MD    Family History Family History  Problem Relation Age of Onset  . Hypertension Mother   . Diabetes Father   . Diabetes Maternal Grandmother     Social History Social History   Tobacco Use  . Smoking status: Former Smoker    Packs/day: 0.25    Years: 4.00    Pack years: 1.00    Types: Cigarettes  . Smokeless tobacco: Never Used  Substance Use Topics  . Alcohol use: Yes    Alcohol/week: 0.0 standard drinks    Comment: Social  . Drug use: No     Allergies   Patient has no known allergies.   Review of Systems Review of Systems  Gastrointestinal: Positive for vomiting.  All other systems reviewed and are negative.    Physical Exam Updated Vital Signs BP (!) 139/104 (BP Location: Left Arm)   Pulse 97   Temp 98.6 F (37 C) (Oral)   Resp 18   LMP 03/22/2019  SpO2 98%   Physical Exam Vitals signs and nursing note reviewed.  Constitutional:      General: She is not in acute distress.    Appearance: She is well-developed. She is obese.     Comments: Morbidly obese female resting comfortably in bed in no acute discomfort.  HENT:     Head: Atraumatic.  Eyes:     Conjunctiva/sclera: Conjunctivae normal.  Neck:     Musculoskeletal: Neck supple.  Cardiovascular:     Rate and Rhythm: Normal rate and regular rhythm.     Pulses: Normal pulses.     Heart sounds: Normal heart sounds.  Pulmonary:     Effort: Pulmonary effort is normal.     Breath sounds: Normal breath sounds.  Abdominal:     General: Abdomen is flat.     Palpations: Abdomen is soft.     Tenderness: There is no abdominal tenderness.     Comments: No reproducible abdominal tenderness on exam.  Skin:    Findings: No rash.   Neurological:     Mental Status: She is alert.  Psychiatric:        Mood and Affect: Mood normal.      ED Treatments / Results  Labs (all labs ordered are listed, but only abnormal results are displayed) Labs Reviewed  COMPREHENSIVE METABOLIC PANEL - Abnormal; Notable for the following components:      Result Value   Potassium 3.2 (*)    Total Protein 8.8 (*)    All other components within normal limits  URINALYSIS, ROUTINE W REFLEX MICROSCOPIC - Abnormal; Notable for the following components:   APPearance HAZY (*)    Hgb urine dipstick MODERATE (*)    Ketones, ur 20 (*)    Protein, ur 30 (*)    Leukocytes,Ua TRACE (*)    Bacteria, UA RARE (*)    All other components within normal limits  CBC WITH DIFFERENTIAL/PLATELET  LIPASE, BLOOD    EKG None  Radiology No results found.  Procedures Procedures (including critical care time)  Medications Ordered in ED Medications  potassium chloride SA (K-DUR) CR tablet 40 mEq (has no administration in time range)  sodium chloride 0.9 % bolus 1,000 mL (0 mLs Intravenous Stopped 03/27/19 1420)  metoCLOPramide (REGLAN) injection 10 mg (10 mg Intravenous Given 03/27/19 1220)     Initial Impression / Assessment and Plan / ED Course  I have reviewed the triage vital signs and the nursing notes.  Pertinent labs & imaging results that were available during my care of the patient were reviewed by me and considered in my medical decision making (see chart for details).        BP (!) 139/104 (BP Location: Left Arm)   Pulse 97   Temp 98.6 F (37 C) (Oral)   Resp 18   LMP 03/22/2019   SpO2 98%    Final Clinical Impressions(s) / ED Diagnoses   Final diagnoses:  Non-intractable vomiting with nausea, unspecified vomiting type    ED Discharge Orders    None     11:48 AM Patient endorsed nausea and vomiting and abdominal discomfort.  On exam, no significant abdominal discomfort were noted.  Labs of reassuring, she is  afebrile, will provide IV fluid and symptomatic treatment and will recheck labs.  She had a COVID-19 test yesterday that was negative.  3:35 PM Labs show mild hypokalemia with potassium of 3.2, supplementation given.  Labs otherwise reassuring.  After receiving IV fluid as well as antinausea medication  patient felt better.  She tolerates p.o. and stable for discharge.  Return precaution discussed.  Patient does have blood in the urine however he denies any urinary discomfort.  Currently on her menstruation.   Fayrene Helper, PA-C 03/27/19 1538    Linwood Dibbles, MD 03/28/19 [phone]

## 2019-03-27 NOTE — Discharge Instructions (Signed)
Drink plenty of fluid to stay hydrated.  Eat a banana daily for 1 week as your potassium level is low.  Follow up with your doctor for further care.  Return if you have any concerns.

## 2019-03-29 ENCOUNTER — Encounter (HOSPITAL_COMMUNITY): Payer: Self-pay

## 2019-03-29 ENCOUNTER — Other Ambulatory Visit: Payer: Self-pay

## 2019-03-29 ENCOUNTER — Emergency Department (HOSPITAL_COMMUNITY)
Admission: EM | Admit: 2019-03-29 | Discharge: 2019-03-29 | Disposition: A | Discharge status: Home / Self Care | Payer: Self-pay | Attending: Emergency Medicine | Admitting: Emergency Medicine

## 2019-03-29 DIAGNOSIS — R110 Nausea: Secondary | ICD-10-CM | POA: Insufficient documentation

## 2019-03-29 DIAGNOSIS — Z87891 Personal history of nicotine dependence: Secondary | ICD-10-CM | POA: Insufficient documentation

## 2019-03-29 MED ORDER — PROMETHAZINE HCL 25 MG PO TABS
25.0000 mg | ORAL_TABLET | Freq: Once | ORAL | Status: AC
  Administered 2019-03-29: 19:00:00 25 mg via ORAL
  Filled 2019-03-29: qty 1

## 2019-03-29 MED ORDER — PROMETHAZINE HCL 25 MG PO TABS: 25.0000 mg | ORAL_TABLET | Freq: Four times a day (QID) | ORAL | 0 refills | Status: DC | PRN

## 2019-03-29 NOTE — ED Provider Notes (Signed)
[facility]   CSN: [id number] Arrival date & time: 03/29/19  1724     History   Chief Complaint Chief Complaint  Patient presents with  . Emesis    HPI Carmen Price is a 28 y.o. female.     The history is provided by the patient and medical records. No language interpreter was used.  Emesis Associated symptoms: no abdominal pain and no diarrhea    Carmen D Wylene Simmerollard is a 28 y.o. female  with a PMH as listed below who presents to the Emergency Department complaining of continued nausea and vomiting.  She was seen at urgent care 2 days ago, then in the emergency department for the same.  She states that overall, she feels improved.  She has been able to keep fluids down if she drinks slowly and eat and has tolerated some solid foods today.  He had an episode of emesis this afternoon which looked more of a yellow-green color which was different.  She googled this and was concerned that it could be her gallbladder as Google said this could be bile and a symptom of problems with her gallbladder.  She did not feel like the Zofran at home was helping much.  Denies any fever or chills.  No true abdominal pain, just feeling nauseous.  No urinary symptoms.  No fevers.  Past Medical History:  Diagnosis Date  . Anemia   . GERD (gastroesophageal reflux disease)     Patient Active Problem List   Diagnosis Date Noted  . Symptomatic anemia 02/03/2015  . Blood transfusion during current hospitalization 02/03/2015  . CHILDHOOD OBESITY 03/13/2007  . Abnormal uterine bleeding (AUB) 03/13/2007  . ACNE VULGARIS, FACIAL 03/13/2007    Past Surgical History:  Procedure Laterality Date  . TONSILLECTOMY       OB History    Gravida  0   Para  0   Term  0   Preterm  0   AB  0   Living  0     SAB  0   TAB  0   Ectopic  0   Multiple  0   Live Births               Home Medications    Prior to Admission medications    Medication Sig Start Date End Date Taking? Authorizing Provider  ondansetron (ZOFRAN-ODT) 4 MG disintegrating tablet Take 1 tablet (4 mg total) by mouth every 8 (eight) hours as needed for nausea or vomiting. 03/26/19   Mardella LaymanHagler, Brian, MD  promethazine (PHENERGAN) 25 MG tablet Take 1 tablet (25 mg total) by mouth every 6 (six) hours as needed for nausea. 03/29/19   Ward, Chase PicketJaime Pilcher, PA-C    Family History Family History  Problem Relation Age of Onset  . Hypertension Mother   . Diabetes Father   . Diabetes Maternal Grandmother     Social History Social History   Tobacco Use  . Smoking status: Former Smoker    Packs/day: 0.25    Years: 4.00    Pack years: 1.00    Types: Cigarettes  . Smokeless tobacco: Never Used  Substance Use Topics  . Alcohol use: Yes    Alcohol/week: 0.0 standard drinks    Comment: Social  . Drug use: No     Allergies   Patient has no known allergies.   Review of Systems Review of Systems  Gastrointestinal: Positive for nausea and vomiting. Negative for abdominal  pain, constipation and diarrhea.  All other systems reviewed and are negative.    Physical Exam Updated Vital Signs BP 140/90 (BP Location: Left Arm)   Pulse 95   Temp 99.1 F (37.3 C) (Oral)   Resp 19   LMP 03/22/2019   SpO2 99%   Physical Exam Vitals signs and nursing note reviewed.  Constitutional:      General: She is not in acute distress.    Appearance: She is well-developed.     Comments: Nontoxic-appearing.  HENT:     Head: Normocephalic and atraumatic.  Neck:     Musculoskeletal: Neck supple.  Cardiovascular:     Rate and Rhythm: Normal rate and regular rhythm.     Heart sounds: Normal heart sounds. No murmur.  Pulmonary:     Effort: Pulmonary effort is normal. No respiratory distress.     Breath sounds: Normal breath sounds.  Abdominal:     General: There is no distension.     Palpations: Abdomen is soft.     Comments: No abdominal, flank or CVA tenderness.   Negative Murphy's.  Skin:    General: Skin is warm and dry.  Neurological:     Mental Status: She is alert and oriented to person, place, and time.      ED Treatments / Results  Labs (all labs ordered are listed, but only abnormal results are displayed) Labs Reviewed - No data to display  EKG None  Radiology No results found.  Procedures Procedures (including critical care time)  Medications Ordered in ED Medications  promethazine (PHENERGAN) tablet 25 mg (25 mg Oral Given 03/29/19 1901)     Initial Impression / Assessment and Plan / ED Course  I have reviewed the triage vital signs and the nursing notes.  Pertinent labs & imaging results that were available during my care of the patient were reviewed by me and considered in my medical decision making (see chart for details).        Carmen Price is a 28 y.o. female who presents to ED for improving nausea and vomiting.  She was seen by urgent care 2 days ago and yesterday in the ER for same.  Chart reviewed from these encounters.  On exam today, patient is well-appearing, afebrile, vitals stable with no abdominal, flank or CVA tenderness.  She had an episode of vomiting today which was yellow-green coloration and after googling this, became concerned that her gallbladder could be the etiology.  I discussed with her that her lab work the last 2 days included LFTs which were normal which was a good sign and also she has no tenderness to the right upper quadrant where her gallbladder is located.  Given benign abdominal exam and reassuring labs over the last 2 days, did not feel that further imaging was needed at this point.  She agrees and feels reassured following my explanation.  Discussed with her about repeating lab work.  We both agree that as her symptoms are improving and were normal the last 2 days other than mild hypokalemia, would not be of much yield and will hold off at this time.  PCP follow-up if still having  symptoms on Monday.  Reasons to return to the ER were discussed and all questions were answered.  She was discharged home in satisfactory condition.   Final Clinical Impressions(s) / ED Diagnoses   Final diagnoses:  Nausea    ED Discharge Orders         Ordered  promethazine (PHENERGAN) 25 MG tablet  Every 6 hours PRN     03/29/19 1908           Ward, Chase Picket, PA-C 03/29/19 1952    Melene Plan, DO 03/29/19 2209

## 2019-03-29 NOTE — ED Notes (Signed)
Patient verbalizes understanding of discharge instructions. Opportunity for questioning and answers were provided. Armband removed by staff, pt discharged from ED.  

## 2019-03-29 NOTE — Discharge Instructions (Signed)
Stay hydrated.  Phenergan as needed for nausea/vomiting.  Call your primary care doctor on Monday if your symptoms have not resolved.  Return to ER for new or worsening symptoms, any additional concerns.

## 2019-03-29 NOTE — ED Triage Notes (Signed)
Pt reports continued vomiting (greenish/yellow). Pt seen here yesterday for the same. Pt taking zofran at home without relief. Denies abd pain.

## 2019-05-05 ENCOUNTER — Encounter (HOSPITAL_COMMUNITY): Payer: Self-pay

## 2019-05-05 ENCOUNTER — Other Ambulatory Visit: Payer: Self-pay

## 2019-05-05 ENCOUNTER — Ambulatory Visit (HOSPITAL_COMMUNITY)
Admission: EM | Admit: 2019-05-05 | Discharge: 2019-05-05 | Disposition: A | Discharge status: Home / Self Care | Payer: Self-pay | Attending: Emergency Medicine | Admitting: Emergency Medicine

## 2019-05-05 DIAGNOSIS — S8000XA Contusion of unspecified knee, initial encounter: Secondary | ICD-10-CM

## 2019-05-05 DIAGNOSIS — S8010XA Contusion of unspecified lower leg, initial encounter: Secondary | ICD-10-CM

## 2019-05-05 DIAGNOSIS — S46811A Strain of other muscles, fascia and tendons at shoulder and upper arm level, right arm, initial encounter: Secondary | ICD-10-CM

## 2019-05-05 DIAGNOSIS — S161XXA Strain of muscle, fascia and tendon at neck level, initial encounter: Secondary | ICD-10-CM

## 2019-05-05 MED ORDER — CYCLOBENZAPRINE HCL 10 MG PO TABS: 10.0000 mg | ORAL_TABLET | Freq: Two times a day (BID) | ORAL | 0 refills | Status: DC | PRN

## 2019-05-05 MED ORDER — IBUPROFEN 800 MG PO TABS: 800.0000 mg | ORAL_TABLET | Freq: Three times a day (TID) | ORAL | 0 refills | Status: DC
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
## 2019-05-05 NOTE — Discharge Instructions (Signed)
Use anti-inflammatories for pain/swelling. You may take up to 800 mg Ibuprofen every 8 hours with food. You may supplement Ibuprofen with Tylenol [phone] mg every 8 hours.   You may use flexeril as needed to help with pain. This is a muscle relaxer and causes sedation- please use only at bedtime or when you will be home and not have to drive/work  Ice and heat   Elevate and ice leg  Follow up if not improving or worsening
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
## 2019-05-16 LAB — OB RESULTS CONSOLE GBS: GBS: POSITIVE

## 2019-07-19 ENCOUNTER — Ambulatory Visit (HOSPITAL_COMMUNITY)
Admission: EM | Admit: 2019-07-19 | Discharge: 2019-07-19 | Disposition: A | Discharge status: Home / Self Care | Payer: Self-pay

## 2019-07-19 ENCOUNTER — Other Ambulatory Visit: Payer: Self-pay

## 2019-07-20 ENCOUNTER — Encounter (HOSPITAL_COMMUNITY): Payer: Self-pay

## 2019-07-20 ENCOUNTER — Ambulatory Visit (HOSPITAL_COMMUNITY)
Admission: EM | Admit: 2019-07-20 | Discharge: 2019-07-20 | Disposition: A | Discharge status: Home / Self Care | Payer: HRSA Program | Attending: Family Medicine | Admitting: Family Medicine

## 2019-07-20 ENCOUNTER — Other Ambulatory Visit: Payer: Self-pay

## 2019-07-20 DIAGNOSIS — Z20822 Contact with and (suspected) exposure to covid-19: Secondary | ICD-10-CM | POA: Diagnosis not present
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
## 2019-07-20 NOTE — Discharge Instructions (Signed)
You have been tested for COVID-19 today. °If your test returns positive, you will receive a phone call from [facility] regarding your results. °Negative test results are not called. °Both positive and negative results area always visible on MyChart. °If you do not have a MyChart account, sign up instructions are provided in your discharge papers. °Please do not hesitate to contact us should you have questions or concerns. ° °

## 2019-07-20 NOTE — ED Provider Notes (Signed)
[place]   [id number] 07/20/19 Arrival Time: 2951  ASSESSMENT & PLAN:  1. Exposure to COVID-19 virus      COVID-19 testing sent. See letter/work note on file for self-isolation guidelines.   Follow-up Information    Clent Demark, PA-C.   Specialty: Physician Assistant Why: As needed. Contact information: [place] [address] [phone]           Reviewed expectations re: course of current medical issues. Questions answered. Outlined signs and symptoms indicating need for more acute intervention. Patient verbalized understanding. After Visit Summary given.   SUBJECTIVE: History from: patient. Carmen Price is a 29 y.o. female who requests COVID-19 testing. Known COVID-19 contact: co-worker. Recent travel: none. Denies: runny nose, congestion, fever, cough, sore throat, difficulty breathing and headache. Normal PO intake without n/v/d.  ROS: As per HPI.   OBJECTIVE:  Vitals:   07/20/19 1110  BP: 112/69  Pulse: 91  Resp: 18  Temp: 99 F (37.2 C)  SpO2: 100%    General appearance: alert; no distress Eyes: PERRLA; EOMI; conjunctiva normal HENT: [place]; AT; nasal mucosa normal; oral mucosa normal Neck: supple  Lungs: speaks full sentences without difficulty; unlabored Extremities: no edema Skin: warm and dry Neurologic: normal gait Psychological: alert and cooperative; normal mood and affect  Labs:  Labs Reviewed  NOVEL CORONAVIRUS, NAA (HOSP ORDER, SEND-OUT TO REF LAB; TAT 18-24 HRS)      No Known Allergies  Past Medical History:  Diagnosis Date  . Anemia   . GERD (gastroesophageal reflux disease)    Social History   Socioeconomic History  . Marital status: Single    Spouse name: Not on file  . Number of children: Not on file  . Years of education: Not on file  . Highest education level: Not on file  Occupational History  . Not on file  Tobacco Use  . Smoking status: Former Smoker   Packs/day: 0.25    Years: 4.00    Pack years: 1.00    Types: Cigarettes  . Smokeless tobacco: Never Used  Substance and Sexual Activity  . Alcohol use: Yes    Alcohol/week: 0.0 standard drinks    Comment: Social  . Drug use: No  . Sexual activity: Not on file  Other Topics Concern  . Not on file  Social History Narrative  . Not on file   Social Determinants of Health   Financial Resource Strain:   . Difficulty of Paying Living Expenses: Not on file  Food Insecurity:   . Worried About Charity fundraiser in the Last Year: Not on file  . Ran Out of Food in the Last Year: Not on file  Transportation Needs:   . Lack of Transportation (Medical): Not on file  . Lack of Transportation (Non-Medical): Not on file  Physical Activity:   . Days of Exercise per Week: Not on file  . Minutes of Exercise per Session: Not on file  Stress:   . Feeling of Stress : Not on file  Social Connections:   . Frequency of Communication with Friends and Family: Not on file  . Frequency of Social Gatherings with Friends and Family: Not on file  . Attends Religious Services: Not on file  . Active Member of Clubs or Organizations: Not on file  . Attends Archivist Meetings: Not on file  . Marital Status: Not on file  Intimate Partner Violence:   . Fear of Current or Ex-Partner: Not  on file  . Emotionally Abused: Not on file  . Physically Abused: Not on file  . Sexually Abused: Not on file   Family History  Problem Relation Age of Onset  . Hypertension Mother   . Diabetes Father   . Diabetes Maternal Grandmother    Past Surgical History:  Procedure Laterality Date  . Greig Right, MD 07/20/19 1118

## 2019-07-20 NOTE — ED Notes (Signed)
Not registered 

## 2019-07-21 LAB — NOVEL CORONAVIRUS, NAA (HOSP ORDER, SEND-OUT TO REF LAB; TAT 18-24 HRS): SARS-CoV-2, NAA: NOT DETECTED

## 2019-10-24 ENCOUNTER — Inpatient Hospital Stay (HOSPITAL_COMMUNITY)
Admission: AD | Admit: 2019-10-24 | Discharge: 2019-10-24 | Disposition: A | Discharge status: Home / Self Care | Payer: Medicaid Other | Attending: Family Medicine | Admitting: Family Medicine

## 2019-10-24 ENCOUNTER — Encounter (HOSPITAL_COMMUNITY): Payer: Self-pay | Admitting: Family Medicine

## 2019-10-24 ENCOUNTER — Other Ambulatory Visit: Payer: Self-pay

## 2019-10-24 DIAGNOSIS — Z3201 Encounter for pregnancy test, result positive: Secondary | ICD-10-CM | POA: Insufficient documentation

## 2019-10-24 DIAGNOSIS — Z3200 Encounter for pregnancy test, result unknown: Secondary | ICD-10-CM | POA: Diagnosis present

## 2019-10-24 LAB — POCT PREGNANCY, URINE: Preg Test, Ur: POSITIVE — AB

## 2019-10-24 MED ORDER — METOCLOPRAMIDE HCL 10 MG PO TABS: 10.0000 mg | ORAL_TABLET | Freq: Four times a day (QID) | ORAL | 1 refills | Status: DC

## 2019-10-24 NOTE — MAU Provider Note (Signed)
Carmen Price is a 29 y.o. G1P0000  at Unknown gestation here for pregnancy test. Patient reports a positive test at home and wants to know if it is really positive. SHe denies abdominal pain or vaginal pain or vaginal bleeding.   UPT was performed in MAU before staff aware that patient had no OB complaints (I.e., that she would not need to see a provider other than for a medical screen). UPT is positive; she was given pregnancy confirmation letter and instructions on where to begin prenatal care. She was discharged home in stable condition.   Patient Vitals for the past 24 hrs:  BP Temp Pulse Resp SpO2 Height Weight  10/24/19 1426 131/73 98.7 F (37.1 C) 100 16 100 % 5\' 7"  (1.702 m) (!) 306 lb (138.8 kg)

## 2019-10-24 NOTE — Discharge Instructions (Signed)
[facility] Providers    [facility]       Phone: [phone]  [facility]   Phone: [phone]  [facility]  Phone: [phone]  [facility]  Phone: [phone]  [facility]  Phone: [phone]  [facility]   Phone: [phone]  [facility]       Phone: [phone]  [facility]    Phone: [phone]   [facility]    Phone: [phone]  [facility]    Phone: [phone]  [facility]    Phone: [phone]  [facility]       Phone: [phone]   [facility]  Phone: [phone]  [facility]    Phone: [phone]  [facility]    Phone: [phone]  [facility]      Phone: [phone]  [facility]    Phone: [phone]   

## 2019-10-24 NOTE — MAU Note (Signed)
.   Carmen Price is a 29 y.o. at Unknown here in MAU reporting: that she had two positive pregnancy test at home and just wants to make sure she is pregnant. Denies any pain of VB LMP: 09/12/19 Onset of complaint:  Pain score: 0 Vitals:   10/24/19 1426  BP: 131/73  Pulse: 100  Resp: 16  Temp: 98.7 F (37.1 C)  SpO2: 100%     FHT: Lab orders placed from triage:

## 2019-11-04 ENCOUNTER — Other Ambulatory Visit: Payer: Self-pay

## 2019-11-04 ENCOUNTER — Inpatient Hospital Stay (HOSPITAL_COMMUNITY): Payer: Medicaid Other

## 2019-11-04 ENCOUNTER — Encounter (HOSPITAL_COMMUNITY): Payer: Self-pay | Admitting: Obstetrics and Gynecology

## 2019-11-04 ENCOUNTER — Inpatient Hospital Stay (HOSPITAL_COMMUNITY)
Admission: AD | Admit: 2019-11-04 | Discharge: 2019-11-04 | Disposition: A | Discharge status: Home / Self Care | Payer: Medicaid Other | Attending: Obstetrics and Gynecology | Admitting: Obstetrics and Gynecology

## 2019-11-04 DIAGNOSIS — Z3A01 Less than 8 weeks gestation of pregnancy: Secondary | ICD-10-CM | POA: Insufficient documentation

## 2019-11-04 DIAGNOSIS — Z3491 Encounter for supervision of normal pregnancy, unspecified, first trimester: Secondary | ICD-10-CM

## 2019-11-04 DIAGNOSIS — A5901 Trichomonal vulvovaginitis: Secondary | ICD-10-CM | POA: Insufficient documentation

## 2019-11-04 DIAGNOSIS — O98311 Other infections with a predominantly sexual mode of transmission complicating pregnancy, first trimester: Secondary | ICD-10-CM | POA: Diagnosis not present

## 2019-11-04 DIAGNOSIS — R109 Unspecified abdominal pain: Secondary | ICD-10-CM | POA: Diagnosis present

## 2019-11-04 DIAGNOSIS — Z87891 Personal history of nicotine dependence: Secondary | ICD-10-CM | POA: Diagnosis not present

## 2019-11-04 DIAGNOSIS — O23591 Infection of other part of genital tract in pregnancy, first trimester: Secondary | ICD-10-CM

## 2019-11-04 DIAGNOSIS — O26891 Other specified pregnancy related conditions, first trimester: Secondary | ICD-10-CM

## 2019-11-04 LAB — URINALYSIS, ROUTINE W REFLEX MICROSCOPIC
Bilirubin Urine: NEGATIVE
Glucose, UA: NEGATIVE mg/dL
Ketones, ur: NEGATIVE mg/dL
Nitrite: NEGATIVE
Protein, ur: NEGATIVE mg/dL
Specific Gravity, Urine: 1.002 — ABNORMAL LOW (ref 1.005–1.030)
pH: 6 (ref 5.0–8.0)

## 2019-11-04 LAB — CBC
HCT: 39.9 % (ref 36.0–46.0)
Hemoglobin: 13.1 g/dL (ref 12.0–15.0)
MCH: 31.3 pg (ref 26.0–34.0)
MCHC: 32.8 g/dL (ref 30.0–36.0)
MCV: 95.5 fL (ref 80.0–100.0)
Platelets: 220 10*3/uL (ref 150–400)
RBC: 4.18 MIL/uL (ref 3.87–5.11)
RDW: 12.4 % (ref 11.5–15.5)
WBC: 7.1 10*3/uL (ref 4.0–10.5)
nRBC: 0 % (ref 0.0–0.2)

## 2019-11-04 LAB — WET PREP, GENITAL
Sperm: NONE SEEN
Yeast Wet Prep HPF POC: NONE SEEN

## 2019-11-04 LAB — ABO/RH: ABO/RH(D): O POS

## 2019-11-04 LAB — HCG, QUANTITATIVE, PREGNANCY: hCG, Beta Chain, Quant, S: 28458 m[IU]/mL — ABNORMAL HIGH (ref <5)

## 2019-11-04 MED ORDER — METRONIDAZOLE 500 MG PO TABS
2000.0000 mg | ORAL_TABLET | Freq: Once | ORAL | Status: AC
  Administered 2019-11-04: 2000 mg via ORAL
  Filled 2019-11-04: qty 4

## 2019-11-04 NOTE — MAU Note (Signed)
Carmen Price is a 29 y.o. at [redacted]w[redacted]d here in MAU reporting: abdominal pain since last night. Has been getting worse. No bleeding.  Onset of complaint: today  Pain score: 10/10  Vitals:   11/04/19 1117  BP: (!) 96/54  Pulse: 62  Resp: 16  Temp: 98.2 F (36.8 C)  SpO2: 100%     Lab orders placed from triage: UA, pt used bathroom while in the lobby, verbalizes understanding that we need a sample. Cup provided.

## 2019-11-04 NOTE — Discharge Instructions (Signed)
Trichomoniasis Trichomoniasis is an STI (sexually transmitted infection) that can affect both women and men. In women, the outer area of the female genitalia (vulva) and the vagina are affected. In men, mainly the penis is affected, but the prostate and other reproductive organs can also be involved.  This condition can be treated with medicine. It often has no symptoms (is asymptomatic), especially in men. If not treated, trichomoniasis can last for months or years. What are the causes? This condition is caused by a parasite called Trichomonas vaginalis. Trichomoniasis most often spreads from person to person (is contagious) through sexual contact. What increases the risk? The following factors may make you more likely to develop this condition:  Having unprotected sex.  Having sex with a partner who has trichomoniasis.  Having multiple sexual partners.  Having had previous trichomoniasis infections or other STIs. What are the signs or symptoms? In women, symptoms of trichomoniasis include:  Abnormal vaginal discharge that is clear, white, gray, or yellow-green and foamy and has an unusual "fishy" odor.  Itching and irritation of the vagina and vulva.  Burning or pain during urination or sex.  Redness and swelling of the genitals. In men, symptoms of trichomoniasis include:  Penile discharge that may be foamy or contain pus.  Pain in the penis. This may happen only when urinating.  Itching or irritation inside the penis.  Burning after urination or ejaculation. How is this diagnosed? In women, this condition may be found during a routine Pap test or physical exam. It may be found in men during a routine physical exam. Your health care provider may do tests to help diagnose this infection, such as:  Urine tests (men and women).  The following in women: ? Testing the pH of the vagina. ? A vaginal swab test that checks for the Trichomonas vaginalis parasite. ? Testing vaginal  secretions. Your health care provider may test you for other STIs, including HIV (human immunodeficiency virus). How is this treated? This condition is treated with medicine taken by mouth (orally), such as metronidazole or tinidazole, to fight the infection. Your sexual partner(s) also need to be tested and treated.  If you are a woman and you plan to become pregnant or think you may be pregnant, tell your health care provider right away. Some medicines that are used to treat the infection should not be taken during pregnancy. Your health care provider may recommend over-the-counter medicines or creams to help relieve itching or irritation. You may be tested for infection again 3 months after treatment. Follow these instructions at home:  Take and use over-the-counter and prescription medicines, including creams, only as told by your health care provider.  Take your antibiotic medicine as told by your health care provider. Do not stop taking the antibiotic even if you start to feel better.  Do not have sex until 7-10 days after you finish your medicine, or until your health care provider approves. Ask your health care provider when you may start to have sex again.  (Women) Do not douche or wear tampons while you have the infection.  Discuss your infection with your sexual partner(s). Make sure that your partner gets tested and treated, if necessary.  Keep all follow-up visits as told by your health care provider. This is important. How is this prevented?   Use condoms every time you have sex. Using condoms correctly and consistently can help protect against STIs.  Avoid having multiple sexual partners.  Talk with your sexual partner about any   symptoms that either of you may have, as well as any history of STIs.  Get tested for STIs and STDs (sexually transmitted diseases) before you have sex. Ask your partner to do the same.  Do not have sexual contact if you have symptoms of  trichomoniasis or another STI. Contact a health care provider if:  You still have symptoms after you finish your medicine.  You develop pain in your abdomen.  You have pain when you urinate.  You have bleeding after sex.  You develop a rash.  You feel nauseous or you vomit.  You plan to become pregnant or think you may be pregnant. Summary  Trichomoniasis is an STI (sexually transmitted infection) that can affect both women and men.  This condition often has no symptoms (is asymptomatic), especially in men.  Without treatment, this condition can last for months or years.  You should not have sex until 7-10 days after you finish your medicine, or until your health care provider approves. Ask your health care provider when you may start to have sex again.  Discuss your infection with your sexual partner(s). Make sure that your partner gets tested and treated, if necessary. This information is not intended to replace advice given to you by your health care provider. Make sure you discuss any questions you have with your health care provider. Document Revised: 04/03/2018 Document Reviewed: 04/03/2018 Elsevier Patient Education  2020 Elsevier Inc.  

## 2019-11-04 NOTE — MAU Provider Note (Signed)
Chief Complaint: Abdominal Pain   First Provider Initiated Contact with Patient 11/04/19 1137     SUBJECTIVE HPI: Carmen Price is a 29 y.o. G1P0000 at [redacted]w[redacted]d who presents to Maternity Admissions reporting abdominal pain. Symptoms started last night. Reports mid to lower abdominal cramping & pressure. Has had an increase in discharge as well. Denies fever, n/v/d, dysuria, or vaginal bleeding. Has new ob appointment at [facility].   Location: abdomen Quality: cramping Severity: 10/10 on pain scale Duration: 1 day Timing: intermittent Modifying factors: none Associated signs and symptoms: vaginal discharge  Past Medical History:  Diagnosis Date  . Anemia   . GERD (gastroesophageal reflux disease)    OB History  Gravida Para Term Preterm AB Living  1 0 0 0 0 0  SAB TAB Ectopic Multiple Live Births  0 0 0 0      # Outcome Date GA Lbr Len/2nd Weight Sex Delivery Anes PTL Lv  1 Current            Past Surgical History:  Procedure Laterality Date  . TONSILLECTOMY     Social History   Socioeconomic History  . Marital status: Single    Spouse name: Not on file  . Number of children: Not on file  . Years of education: Not on file  . Highest education level: Not on file  Occupational History  . Not on file  Tobacco Use  . Smoking status: Former Smoker    Packs/day: 0.25    Years: 4.00    Pack years: 1.00    Types: Cigarettes  . Smokeless tobacco: Never Used  Substance and Sexual Activity  . Alcohol use: Not Currently    Alcohol/week: 0.0 standard drinks    Comment: Social  . Drug use: No  . Sexual activity: Not on file  Other Topics Concern  . Not on file  Social History Narrative  . Not on file   Social Determinants of Health   Financial Resource Strain:   . Difficulty of Paying Living Expenses:   Food Insecurity:   . Worried About Programme researcher, broadcasting/film/video in the Last Year:   . Barista in the Last Year:   Transportation Needs:   . Freight forwarder  (Medical):   Marland Kitchen Lack of Transportation (Non-Medical):   Physical Activity:   . Days of Exercise per Week:   . Minutes of Exercise per Session:   Stress:   . Feeling of Stress :   Social Connections:   . Frequency of Communication with Friends and Family:   . Frequency of Social Gatherings with Friends and Family:   . Attends Religious Services:   . Active Member of Clubs or Organizations:   . Attends Banker Meetings:   Marland Kitchen Marital Status:   Intimate Partner Violence:   . Fear of Current or Ex-Partner:   . Emotionally Abused:   Marland Kitchen Physically Abused:   . Sexually Abused:    Family History  Problem Relation Age of Onset  . Hypertension Mother   . Diabetes Father   . Diabetes Maternal Grandmother    No current facility-administered medications on file prior to encounter.   Current Outpatient Medications on File Prior to Encounter  Medication Sig Dispense Refill  . cyclobenzaprine (FLEXERIL) 10 MG tablet Take 1 tablet (10 mg total) by mouth 2 (two) times daily as needed for muscle spasms. 20 tablet 0  . metoCLOPramide (REGLAN) 10 MG tablet Take 1 tablet (10 mg total) by  mouth every 6 (six) hours. 30 tablet 1  . promethazine (PHENERGAN) 25 MG tablet Take 1 tablet (25 mg total) by mouth every 6 (six) hours as needed for nausea. 20 tablet 0   No Known Allergies  I have reviewed patient's Past Medical Hx, Surgical Hx, Family Hx, Social Hx, medications and allergies.   Review of Systems  Constitutional: Negative.   Gastrointestinal: Positive for abdominal pain. Negative for constipation, diarrhea, nausea and vomiting.  Genitourinary: Positive for vaginal discharge. Negative for dysuria and vaginal bleeding.    OBJECTIVE Patient Vitals for the past 24 hrs:  BP Temp Temp src Pulse Resp SpO2  11/04/19 1216 117/70 -- -- (!) 57 -- --  11/04/19 1117 (!) 96/54 98.2 F (36.8 C) Oral 62 16 100 %   Constitutional: Well-developed, well-nourished female in no acute distress.   Cardiovascular: normal rate & rhythm, no murmur Respiratory: normal rate and effort. Lung sounds clear throughout GI: Abd soft, non-tender, Pos BS x 4. No guarding or rebound tenderness MS: Extremities nontender, no edema, normal ROM Neurologic: Alert and oriented x 4.     LAB RESULTS Results for orders placed or performed during the hospital encounter of 11/04/19 (from the past 24 hour(s))  Urinalysis, Routine w reflex microscopic     Status: Abnormal   Collection Time: 11/04/19 11:32 AM  Result Value Ref Range   Color, Urine STRAW (A) YELLOW   APPearance CLEAR CLEAR   Specific Gravity, Urine 1.002 (L) 1.005 - 1.030   pH 6.0 5.0 - 8.0   Glucose, UA NEGATIVE NEGATIVE mg/dL   Hgb urine dipstick MODERATE (A) NEGATIVE   Bilirubin Urine NEGATIVE NEGATIVE   Ketones, ur NEGATIVE NEGATIVE mg/dL   Protein, ur NEGATIVE NEGATIVE mg/dL   Nitrite NEGATIVE NEGATIVE   Leukocytes,Ua MODERATE (A) NEGATIVE   RBC / HPF 0-5 0 - 5 RBC/hpf   WBC, UA 21-50 0 - 5 WBC/hpf   Bacteria, UA RARE (A) NONE SEEN   Squamous Epithelial / LPF 0-5 0 - 5  CBC     Status: None   Collection Time: 11/04/19 11:57 AM  Result Value Ref Range   WBC 7.1 4.0 - 10.5 K/uL   RBC 4.18 3.87 - 5.11 MIL/uL   Hemoglobin 13.1 12.0 - 15.0 g/dL   HCT 83.3 82.5 - 05.3 %   MCV 95.5 80.0 - 100.0 fL   MCH 31.3 26.0 - 34.0 pg   MCHC 32.8 30.0 - 36.0 g/dL   RDW 97.6 73.4 - 19.3 %   Platelets 220 150 - 400 K/uL   nRBC 0.0 0.0 - 0.2 %  ABO/Rh     Status: None   Collection Time: 11/04/19 11:57 AM  Result Value Ref Range   ABO/RH(D) O POS    No rh immune globuloin      NOT A RH IMMUNE GLOBULIN CANDIDATE, PT RH POSITIVE Performed at [facility], [address], [place], [place] [address]   hCG, quantitative, pregnancy     Status: Abnormal   Collection Time: 11/04/19 11:57 AM  Result Value Ref Range   hCG, Beta Chain, Quant, S 28,458 (H) <5 mIU/mL  Wet prep, genital     Status: Abnormal   Collection Time: 11/04/19 12:07  PM  Result Value Ref Range   Yeast Wet Prep HPF POC NONE SEEN NONE SEEN   Trich, Wet Prep PRESENT (A) NONE SEEN   Clue Cells Wet Prep HPF POC PRESENT (A) NONE SEEN   WBC, Wet Prep HPF  POC MANY (A) NONE SEEN   Sperm NONE SEEN     IMAGING US OB LESS THAN 14 WEEKS WITH OB TRANSVAGINAL  Result Date: 11/04/2019 CLINICAL DATA:  Abdominal pain, pregnant EXAM: OBSTETRIC <14 WK ULTRASOUND TECHNIQUE: Transabdominal ultrasound was performed for evaluation of the gestation as well as the maternal uterus and adnexal regions. COMPARISON:  None. FINDINGS: Intrauterine gestational sac: Single Yolk sac:  Visualized. Embryo:  Visualized. Cardiac Activity: Visualized. Heart Rate: 129 bpm CRL:   8.9 mm   6 w 5 d                  [place] EDC: 06/24/2020 Subchorionic hemorrhage:  None visualized. Maternal uterus/adnexae: Normal. Small volume free fluid in the low pelvis. IMPRESSION: 1. Single intrauterine gestation at sonographic gestational age of [redacted] weeks, 5 days. EDD 06/24/2020. Fetal heart rate 129 bpm. 2.  Small volume nonspecific free fluid in the low pelvis. Electronically Signed   By: Eddie Candle M.D.   On: 11/04/2019 12:58    MAU COURSE Orders Placed This Encounter  Procedures  . Wet prep, genital  . US OB LESS THAN 14 WEEKS WITH OB TRANSVAGINAL  . Urinalysis, Routine w reflex microscopic  . CBC  . hCG, quantitative, pregnancy  . ABO/Rh  . Discharge patient   Meds ordered this encounter  Medications  . metroNIDAZOLE (FLAGYL) tablet 2,000 mg    MDM +UPT UA, wet prep, GC/chlamydia, CBC, ABO/Rh, quant hCG, and [place] today to rule out ectopic pregnancy which can be life threatening.   Ultrasound shows live IUP, edd updated  Wet prep + trichomonas. Patient given flagyl 2 gm PO while in MAU. Will give expedited partner therapy rx & info  ASSESSMENT 1. Trichomonal vaginitis during pregnancy in first trimester   2. Abdominal pain during pregnancy in first trimester   3. Normal IUP (intrauterine  pregnancy) on prenatal ultrasound, first trimester   4. [redacted] weeks gestation of pregnancy     PLAN Discharge home in stable condition. Patient given EPT prescription & info sheet No intercourse x 2 weeks Discussed reasons to return to MAU    Allergies as of 11/04/2019   No Known Allergies     Medication List    TAKE these medications   cyclobenzaprine 10 MG tablet Commonly known as: FLEXERIL Take 1 tablet (10 mg total) by mouth 2 (two) times daily as needed for muscle spasms.   metoCLOPramide 10 MG tablet Commonly known as: REGLAN Take 1 tablet (10 mg total) by mouth every 6 (six) hours.   promethazine 25 MG tablet Commonly known as: PHENERGAN Take 1 tablet (25 mg total) by mouth every 6 (six) hours as needed for nausea.        Jorje Guild, NP 11/04/2019  1:49 PM

## 2019-11-05 LAB — GC/CHLAMYDIA PROBE AMP (~~LOC~~) NOT AT ARMC
Chlamydia: NEGATIVE
Comment: NEGATIVE
Comment: NORMAL
Neisseria Gonorrhea: NEGATIVE

## 2019-11-21 ENCOUNTER — Ambulatory Visit (INDEPENDENT_AMBULATORY_CARE_PROVIDER_SITE_OTHER): Payer: Self-pay

## 2019-11-21 DIAGNOSIS — Z3400 Encounter for supervision of normal first pregnancy, unspecified trimester: Secondary | ICD-10-CM | POA: Insufficient documentation

## 2019-11-21 NOTE — Progress Notes (Signed)
I connected with  [name] on 11/21/19 by a video enabled telemedicine application and verified that I am speaking with the correct person using two identifiers.   I discussed the limitations of evaluation and management by telemedicine. The patient expressed understanding and agreed to proceed.  PRENATAL INTAKE SUMMARY  Ms. Pullin presents today New OB Nurse Interview.   OB History    Gravida  1   Para  0   Term  0   Preterm  0   AB  0   Living  0     SAB  0   TAB  0   Ectopic  0   Multiple  0   Live Births             I have reviewed the patient's medical, obstetrical, social, and family histories, medications, and available lab results.  SUBJECTIVE She has no unusual complaints and complains of nausea with vomiting for 30+ days  OBJECTIVE Initial Nurse Intake (New OB)  GENERAL APPEARANCE: oriented to person, place and time   ASSESSMENT Normal pregnancy  PLAN Prenatal care at [facility] Already have BP Cuff She will download Babyscripts App. PHQ-9=6 Pregnancy Risk Screening done PN labs will be done at NOB visit 11/28/19.

## 2019-11-28 ENCOUNTER — Other Ambulatory Visit (HOSPITAL_COMMUNITY)
Admission: RE | Admit: 2019-11-28 | Discharge: 2019-11-28 | Disposition: A | Discharge status: Home / Self Care | Payer: Medicaid Other | Source: Ambulatory Visit | Attending: Certified Nurse Midwife | Admitting: Certified Nurse Midwife

## 2019-11-28 ENCOUNTER — Encounter: Payer: Self-pay | Admitting: Certified Nurse Midwife

## 2019-11-28 ENCOUNTER — Ambulatory Visit (INDEPENDENT_AMBULATORY_CARE_PROVIDER_SITE_OTHER): Payer: Self-pay | Admitting: Certified Nurse Midwife

## 2019-11-28 VITALS — BP 111/72 | HR 78 | Wt 298.0 lb

## 2019-11-28 DIAGNOSIS — O26891 Other specified pregnancy related conditions, first trimester: Secondary | ICD-10-CM | POA: Insufficient documentation

## 2019-11-28 DIAGNOSIS — O99211 Obesity complicating pregnancy, first trimester: Secondary | ICD-10-CM

## 2019-11-28 DIAGNOSIS — Z3400 Encounter for supervision of normal first pregnancy, unspecified trimester: Secondary | ICD-10-CM

## 2019-11-28 DIAGNOSIS — O99210 Obesity complicating pregnancy, unspecified trimester: Secondary | ICD-10-CM | POA: Insufficient documentation

## 2019-11-28 DIAGNOSIS — N898 Other specified noninflammatory disorders of vagina: Secondary | ICD-10-CM | POA: Insufficient documentation

## 2019-11-28 DIAGNOSIS — O23591 Infection of other part of genital tract in pregnancy, first trimester: Secondary | ICD-10-CM

## 2019-11-28 DIAGNOSIS — Z3401 Encounter for supervision of normal first pregnancy, first trimester: Secondary | ICD-10-CM

## 2019-11-28 DIAGNOSIS — E669 Obesity, unspecified: Secondary | ICD-10-CM

## 2019-11-28 DIAGNOSIS — A5901 Trichomonal vulvovaginitis: Secondary | ICD-10-CM | POA: Insufficient documentation

## 2019-11-28 MED ORDER — ASPIRIN EC 81 MG PO TBEC: 81.0000 mg | DELAYED_RELEASE_TABLET | Freq: Every day | ORAL | 0 refills | Status: DC

## 2019-11-28 MED ORDER — METRONIDAZOLE 500 MG PO TABS: 500.0000 mg | ORAL_TABLET | Freq: Two times a day (BID) | ORAL | 0 refills | Status: DC

## 2019-11-28 MED ORDER — VITAFOL FE+ 90-1-200 & 50 MG PO CPPK: 1.0000 | ORAL_CAPSULE | Freq: Every day | ORAL | 3 refills | Status: DC

## 2019-11-28 NOTE — Patient Instructions (Signed)
First Trimester of Pregnancy  The first trimester of pregnancy is from week 1 until the end of week 13 (months 1 through 3). During this time, your baby will begin to develop inside you. At 6-8 weeks, the eyes and face are formed, and the heartbeat can be seen on ultrasound. At the end of 12 weeks, all the baby's organs are formed. Prenatal care is all the medical care you receive before the birth of your baby. Make sure you get good prenatal care and follow all of your doctor's instructions. Follow these instructions at home: Medicines  Take over-the-counter and prescription medicines only as told by your doctor. Some medicines are safe and some medicines are not safe during pregnancy.  Take a prenatal vitamin that contains at least 600 micrograms (mcg) of folic acid.  If you have trouble pooping (constipation), take medicine that will make your stool soft (stool softener) if your doctor approves. Eating and drinking   Eat regular, healthy meals.  Your doctor will tell you the amount of weight gain that is right for you.  Avoid raw meat and uncooked cheese.  If you feel sick to your stomach (nauseous) or throw up (vomit): ? Eat 4 or 5 small meals a day instead of 3 large meals. ? Try eating a few soda crackers. ? Drink liquids between meals instead of during meals.  To prevent constipation: ? Eat foods that are high in fiber, like fresh fruits and vegetables, whole grains, and beans. ? Drink enough fluids to keep your pee (urine) clear or pale yellow. Activity  Exercise only as told by your doctor. Stop exercising if you have cramps or pain in your lower belly (abdomen) or low back.  Do not exercise if it is too hot, too humid, or if you are in a place of great height (high altitude).  Try to avoid standing for long periods of time. Move your legs often if you must stand in one place for a long time.  Avoid heavy lifting.  Wear low-heeled shoes. Sit and stand up  straight.  You can have sex unless your doctor tells you not to. Relieving pain and discomfort  Wear a good support bra if your breasts are sore.  Take warm water baths (sitz baths) to soothe pain or discomfort caused by hemorrhoids. Use hemorrhoid cream if your doctor says it is okay.  Rest with your legs raised if you have leg cramps or low back pain.  If you have puffy, bulging veins (varicose veins) in your legs: ? Wear support hose or compression stockings as told by your doctor. ? Raise (elevate) your feet for 15 minutes, 3-4 times a day. ? Limit salt in your food. Prenatal care  Schedule your prenatal visits by the twelfth week of pregnancy.  Write down your questions. Take them to your prenatal visits.  Keep all your prenatal visits as told by your doctor. This is important. Safety  Wear your seat belt at all times when driving.  Make a list of emergency phone numbers. The list should include numbers for family, friends, the hospital, and police and fire departments. General instructions  Ask your doctor for a referral to a local prenatal class. Begin classes no later than at the start of month 6 of your pregnancy.  Ask for help if you need counseling or if you need help with nutrition. Your doctor can give you advice or tell you where to go for help.  Do not use hot tubs, steam   rooms, or saunas.  Do not douche or use tampons or scented sanitary pads.  Do not cross your legs for long periods of time.  Avoid all herbs and alcohol. Avoid drugs that are not approved by your doctor.  Do not use any tobacco products, including cigarettes, chewing tobacco, and electronic cigarettes. If you need help quitting, ask your doctor. You may get counseling or other support to help you quit.  Avoid cat litter boxes and soil used by cats. These carry germs that can cause birth defects in the baby and can cause a loss of your baby (miscarriage) or stillbirth.  Visit your dentist.  At home, brush your teeth with a soft toothbrush. Be gentle when you floss. Contact a doctor if:  You are dizzy.  You have mild cramps or pressure in your lower belly.  You have a nagging pain in your belly area.  You continue to feel sick to your stomach, you throw up, or you have watery poop (diarrhea).  You have a bad smelling fluid coming from your vagina.  You have pain when you pee (urinate).  You have increased puffiness (swelling) in your face, hands, legs, or ankles. Get help right away if:  You have a fever.  You are leaking fluid from your vagina.  You have spotting or bleeding from your vagina.  You have very bad belly cramping or pain.  You gain or lose weight rapidly.  You throw up blood. It may look like coffee grounds.  You are around people who have German measles, fifth disease, or chickenpox.  You have a very bad headache.  You have shortness of breath.  You have any kind of trauma, such as from a fall or a car accident. Summary  The first trimester of pregnancy is from week 1 until the end of week 13 (months 1 through 3).  To take care of yourself and your unborn baby, you will need to eat healthy meals, take medicines only if your doctor tells you to do so, and do activities that are safe for you and your baby.  Keep all follow-up visits as told by your doctor. This is important as your doctor will have to ensure that your baby is healthy and growing well. This information is not intended to replace advice given to you by your health care provider. Make sure you discuss any questions you have with your health care provider. Document Revised: 10/11/2018 Document Reviewed: 06/28/2016 Elsevier Patient Education  2020 [employer]  Safe Medications in Pregnancy   Acne: Benzoyl Peroxide Salicylic Acid  Backache/Headache: Tylenol: 2 regular strength every 4 hours OR              2 Extra strength every 6  hours  Colds/Coughs/Allergies: Benadryl (alcohol free) 25 mg every 6 hours as needed Breath right strips Claritin Cepacol throat lozenges Chloraseptic throat spray Cold-Eeze- up to three times per day Cough drops, alcohol free Flonase (by prescription only) Guaifenesin Mucinex Robitussin DM (plain only, alcohol free) Saline nasal spray/drops Sudafed (pseudoephedrine) & Actifed ** use only after [redacted] weeks gestation and if you do not have high blood pressure Tylenol Vicks Vaporub Zinc lozenges Zyrtec   Constipation: Colace Ducolax suppositories Fleet enema Glycerin suppositories Metamucil Milk of magnesia Miralax Senokot Smooth move tea  Diarrhea: Kaopectate Imodium A-D  *NO pepto Bismol  Hemorrhoids: Anusol Anusol HC Preparation H Tucks  Indigestion: Tums Maalox Mylanta Zantac  Pepcid  Insomnia: Benadryl (alcohol free) 25mg every 6 hours as needed   Tylenol PM Unisom, no Gelcaps  Leg Cramps: Tums MagGel  Nausea/Vomiting:  Bonine Dramamine Emetrol Ginger extract Sea bands Meclizine  Nausea medication to take during pregnancy:  Unisom (doxylamine succinate 25 mg tablets) Take one tablet daily at bedtime. If symptoms are not adequately controlled, the dose can be increased to a maximum recommended dose of two tablets daily (1/2 tablet in the morning, 1/2 tablet mid-afternoon and one at bedtime). Vitamin B6 100mg tablets. Take one tablet twice a day (up to 200 mg per day).  Skin Rashes: Aveeno products Benadryl cream or 25mg every 6 hours as needed Calamine Lotion 1% cortisone cream  Yeast infection: Gyne-lotrimin 7 Monistat 7   **If taking multiple medications, please check labels to avoid duplicating the same active ingredients **take medication as directed on the label ** Do not exceed 4000 mg of tylenol in 24 hours **Do not take medications that contain aspirin or ibuprofen   

## 2019-11-28 NOTE — Progress Notes (Signed)
History:   Carmen Price is a 29 y.o. G1P0000 at 45w1dby LMP being seen today for her first obstetrical visit.  Her obstetrical history is significant for obesity. Patient does intend to breast feed. Pregnancy history fully reviewed.  Patient reports no complaints.     HISTORY: OB History  Gravida Para Term Preterm AB Living  1 0 0 0 0 0  SAB TAB Ectopic Multiple Live Births  0 0 0 0 0    # Outcome Date GA Lbr Len/2nd Weight Sex Delivery Anes PTL Lv  1 Current             Last pap smear was done 2018 and was normal  Past Medical History:  Diagnosis Date  . Anemia   . Anxiety   . Blood transfusion without reported diagnosis   . Depression   . GERD (gastroesophageal reflux disease)   . Headache   . Vaginal Pap smear, abnormal    Past Surgical History:  Procedure Laterality Date  . TONSILLECTOMY     Family History  Problem Relation Age of Onset  . Hypertension Mother   . Arthritis Mother   . Depression Mother   . Diabetes Father   . Diabetes Maternal Grandmother   . Cancer Maternal Grandmother   . Hypertension Maternal Grandmother   . Kidney disease Maternal Grandmother   . Asthma Brother   . ADD / ADHD Maternal Uncle   . Diabetes Maternal Uncle   . Hypertension Maternal Uncle    Social History   Tobacco Use  . Smoking status: Former Smoker    Packs/day: 0.25    Years: 4.00    Pack years: 1.00    Types: Cigarettes  . Smokeless tobacco: Never Used  Substance Use Topics  . Alcohol use: Not Currently    Alcohol/week: 0.0 standard drinks    Comment: Social  . Drug use: No   No Known Allergies Current Outpatient Medications on File Prior to Visit  Medication Sig Dispense Refill  . cyclobenzaprine (FLEXERIL) 10 MG tablet Take 1 tablet (10 mg total) by mouth 2 (two) times daily as needed for muscle spasms. (Patient not taking: Reported on 11/21/2019) 20 tablet 0  . metoCLOPramide (REGLAN) 10 MG tablet Take 1 tablet (10 mg total) by mouth every 6 (six)  hours. 30 tablet 1  . promethazine (PHENERGAN) 25 MG tablet Take 1 tablet (25 mg total) by mouth every 6 (six) hours as needed for nausea. (Patient not taking: Reported on 11/21/2019) 20 tablet 0   No current facility-administered medications on file prior to visit.    Review of Systems Pertinent items noted in HPI and remainder of comprehensive ROS otherwise negative. Physical Exam:   Vitals:   11/28/19 1459  BP: 111/72  Pulse: 78     Bedside Ultrasound for FHR check: Viable intrauterine pregnancy with positive cardiac activity note, heartrate 170bpm Patient informed that the ultrasound is considered a limited obstetric ultrasound and is not intended to be a complete ultrasound exam.  Patient also informed that the ultrasound is not being completed with the intent of assessing for fetal or placental anomalies or any pelvic abnormalities.  Explained that the purpose of today's ultrasound is to assess for fetal heart rate.  Patient acknowledges the purpose of the exam and the limitations of the study. Pelvic Exam: Perineum: no hemorrhoids, normal perineum   Vulva: normal external genitalia, no lesions   Vagina:  normal mucosa, moderate amount of white thin discharge  Cervix: no lesions and normal, pap smear done.    Adnexa: normal adnexa and no mass, fullness, tenderness   Bony Pelvis: average  System: General: well-developed, morbid obese female in no acute distress   Breasts:  normal appearance, no masses or tenderness bilaterally   Skin: normal coloration and turgor, no rashes   Neurologic: oriented, normal, negative, normal mood   Extremities: normal strength, tone, and muscle mass, ROM of all joints is normal   HEENT PERRLA, extraocular movement intact and sclera clear   Mouth/Teeth mucous membranes moist, pharynx normal without lesions and dental hygiene good   Neck supple and no masses   Cardiovascular: regular rate and rhythm   Respiratory:  no respiratory distress, normal  breath sounds   Abdomen: soft, non-tender; bowel sounds normal; no masses,  no organomegaly    Assessment:    Pregnancy: G1P0000 Patient Active Problem List   Diagnosis Date Noted  . Obesity during pregnancy, antepartum 11/28/2019  . Trichomonal vaginitis during pregnancy in first trimester 11/28/2019  . Supervision of normal first pregnancy, antepartum 11/21/2019  . Symptomatic anemia 02/03/2015  . Blood transfusion during current hospitalization 02/03/2015  . CHILDHOOD OBESITY 03/13/2007  . Abnormal uterine bleeding (AUB) 03/13/2007  . ACNE VULGARIS, FACIAL 03/13/2007     Plan:    1. Supervision of normal first pregnancy, antepartum - Welcomed to practice and congratulated patient  - Introduced self to patient  - Reviewed safety, visitor policy, reassurance about COVID-19 for pregnancy at this time. Discussed possible changes to visits, including televisits, that may occur due to COVID-19.  The office remains open if pt needs to be seen and [facility] is open 24 hours/day for OB emergencies. - Obstetric Panel, Including HIV - Culture, OB Urine - Enroll Patient in Babyscripts - Cytology - PAP( [place]) - Hepatitis C antibody - Cervicovaginal ancillary only( [place]) - Prenat-FePoly-Metf-FA-DHA-DSS (VITAFOL FE+) 90-1-200 & 50 MG CPPK; Take 1 tablet by mouth daily.  Dispense: 60 each; Refill: 3  2. Obesity during pregnancy, antepartum - BMI 40.71  - Educated and discussed use of bASA starting at 12 weeks, Rx sent to pharmacy  - baseline labs obtained  - Comp Met (CMET) - Protein / creatinine ratio, urine - HgB A1c - aspirin EC 81 MG tablet; Take 1 tablet (81 mg total) by mouth daily. Take after 12 weeks for prevention of preeclampsia later in pregnancy  Dispense: 300 tablet; Refill: 0  3. Vaginal discharge during pregnancy in first trimester - Cervicovaginal ancillary only( [place]) - metroNIDAZOLE (FLAGYL) 500 MG tablet; Take 1 tablet (500 mg total) by mouth 2 (two)  times daily.  Dispense: 14 tablet; Refill: 0  4. Trichomonal vaginitis during pregnancy in first trimester - Tx on 5/3  - TOC obtained today    Initial labs drawn. Rx for prenatal vitamins sent to pharmacy of choice  Problem list reviewed and updated. Genetic Screening discussed, NIPS, AFP: requested. Ultrasound discussed; fetal anatomic survey: requested. Discussed usage of Babyscripts and virtual visits as additional source of managing and completing prenatal visits in midst of coronavirus and pandemic.   Anticipatory guidance for prenatal visits including labs, ultrasounds, and testing; Initial labs drawn. Encouraged to complete MyChart Registration for her ability to review results, send requests, and have questions addressed.  The nature of [place] [facility] with multiple MDs and Advanced Practice Providers was explained to patient; also emphasized that residents, students are part of our team. Routine obstetric precautions reviewed. Encouraged  to seek out care at office or emergency room [facility] preferred) for urgent and/or emergent concerns. Return in about 5 weeks (around 01/02/2020) for ROB/AFP- in person .     Lajean Manes, [place] for [employer], [place]

## 2019-11-28 NOTE — Progress Notes (Signed)
NOB  * Epic System was down   CC: None

## 2019-11-29 LAB — COMPREHENSIVE METABOLIC PANEL
ALT: 11 IU/L (ref 0–32)
AST: 13 IU/L (ref 0–40)
Albumin/Globulin Ratio: 1.6 (ref 1.2–2.2)
Albumin: 4.2 g/dL (ref 3.9–5.0)
Alkaline Phosphatase: 50 IU/L (ref 48–121)
BUN/Creatinine Ratio: 10 (ref 9–23)
BUN: 5 mg/dL — ABNORMAL LOW (ref 6–20)
Bilirubin Total: 0.2 mg/dL (ref 0.0–1.2)
CO2: 21 mmol/L (ref 20–29)
Calcium: 9.1 mg/dL (ref 8.7–10.2)
Chloride: 103 mmol/L (ref 96–106)
Creatinine, Ser: 0.52 mg/dL — ABNORMAL LOW (ref 0.57–1.00)
GFR calc Af Amer: 150 mL/min/{1.73_m2} (ref >59)
GFR calc non Af Amer: 130 mL/min/{1.73_m2} (ref >59)
Globulin, Total: 2.7 g/dL (ref 1.5–4.5)
Glucose: 71 mg/dL (ref 65–99)
Potassium: 3.8 mmol/L (ref 3.5–5.2)
Sodium: 137 mmol/L (ref 134–144)
Total Protein: 6.9 g/dL (ref 6.0–8.5)

## 2019-11-29 LAB — CERVICOVAGINAL ANCILLARY ONLY
Bacterial Vaginitis (gardnerella): POSITIVE — AB
Candida Glabrata: NEGATIVE
Candida Vaginitis: NEGATIVE
Chlamydia: NEGATIVE
Comment: NEGATIVE
Comment: NEGATIVE
Comment: NEGATIVE
Comment: NEGATIVE
Comment: NEGATIVE
Comment: NORMAL
Neisseria Gonorrhea: NEGATIVE
Trichomonas: NEGATIVE

## 2019-11-29 LAB — CYTOLOGY - PAP: Diagnosis: NEGATIVE

## 2019-11-29 LAB — HEMOGLOBIN A1C
Est. average glucose Bld gHb Est-mCnc: 103 mg/dL
Hgb A1c MFr Bld: 5.2 % (ref 4.8–5.6)

## 2019-11-29 LAB — OBSTETRIC PANEL, INCLUDING HIV
Antibody Screen: NEGATIVE
Basophils Absolute: 0 10*3/uL (ref 0.0–0.2)
Basos: 0 %
EOS (ABSOLUTE): 0 10*3/uL (ref 0.0–0.4)
Eos: 0 %
HIV Screen 4th Generation wRfx: NONREACTIVE
Hematocrit: 34.4 % (ref 34.0–46.6)
Hemoglobin: 11.6 g/dL (ref 11.1–15.9)
Hepatitis B Surface Ag: NEGATIVE
Immature Grans (Abs): 0 10*3/uL (ref 0.0–0.1)
Immature Granulocytes: 0 %
Lymphocytes Absolute: 1.9 10*3/uL (ref 0.7–3.1)
Lymphs: 34 %
MCH: 31.8 pg (ref 26.6–33.0)
MCHC: 33.7 g/dL (ref 31.5–35.7)
MCV: 94 fL (ref 79–97)
Monocytes Absolute: 0.5 10*3/uL (ref 0.1–0.9)
Monocytes: 9 %
Neutrophils Absolute: 3.1 10*3/uL (ref 1.4–7.0)
Neutrophils: 57 %
Platelets: 231 10*3/uL (ref 150–450)
RBC: 3.65 x10E6/uL — ABNORMAL LOW (ref 3.77–5.28)
RDW: 12.4 % (ref 11.7–15.4)
RPR Ser Ql: NONREACTIVE
Rh Factor: POSITIVE
Rubella Antibodies, IGG: 6.15 index (ref >0.99)
WBC: 5.5 10*3/uL (ref 3.4–10.8)

## 2019-11-29 LAB — HEPATITIS C ANTIBODY: Hep C Virus Ab: <0.1 s/co ratio (ref 0.0–0.9)

## 2019-11-29 LAB — PROTEIN / CREATININE RATIO, URINE
Creatinine, Urine: 204.8 mg/dL
Protein, Ur: 39.5 mg/dL
Protein/Creat Ratio: 193 mg/g creat (ref 0–200)

## 2019-11-30 LAB — URINE CULTURE, OB REFLEX

## 2019-11-30 LAB — CULTURE, OB URINE

## 2019-12-04 ENCOUNTER — Other Ambulatory Visit: Payer: Self-pay

## 2019-12-04 DIAGNOSIS — N898 Other specified noninflammatory disorders of vagina: Secondary | ICD-10-CM

## 2019-12-04 MED ORDER — METRONIDAZOLE 0.75 % VA GEL: 1.0000 | Freq: Every day | VAGINAL | 0 refills | Status: DC

## 2019-12-04 NOTE — Progress Notes (Signed)
Pt cannot tolerate oral Rx for BV sent Metrogel pt made aware.

## 2020-01-02 ENCOUNTER — Other Ambulatory Visit: Payer: Self-pay

## 2020-01-02 ENCOUNTER — Ambulatory Visit (INDEPENDENT_AMBULATORY_CARE_PROVIDER_SITE_OTHER): Payer: Medicaid Other | Admitting: Family Medicine

## 2020-01-02 ENCOUNTER — Encounter: Payer: Self-pay | Admitting: Family Medicine

## 2020-01-02 VITALS — BP 116/79 | HR 79 | Wt 311.0 lb

## 2020-01-02 DIAGNOSIS — O99210 Obesity complicating pregnancy, unspecified trimester: Secondary | ICD-10-CM

## 2020-01-02 DIAGNOSIS — A5901 Trichomonal vulvovaginitis: Secondary | ICD-10-CM

## 2020-01-02 DIAGNOSIS — Z3400 Encounter for supervision of normal first pregnancy, unspecified trimester: Secondary | ICD-10-CM

## 2020-01-02 DIAGNOSIS — Z3A15 15 weeks gestation of pregnancy: Secondary | ICD-10-CM

## 2020-01-02 DIAGNOSIS — E668 Other obesity: Secondary | ICD-10-CM

## 2020-01-02 MED ORDER — VITAFOL ULTRA 29-0.6-0.4-200 MG PO CAPS: 1.0000 | ORAL_CAPSULE | Freq: Every day | ORAL | 12 refills | Status: DC

## 2020-01-02 NOTE — Patient Instructions (Signed)

## 2020-01-02 NOTE — Progress Notes (Signed)
   PRENATAL VISIT NOTE  Subjective:  Carmen Price is a 29 y.o. G1P0000 at [redacted]w[redacted]d being seen today for ongoing prenatal care.  She is currently monitored for the following issues for this low-risk pregnancy and has CHILDHOOD OBESITY; Abnormal uterine bleeding (AUB); ACNE VULGARIS, FACIAL; Supervision of normal first pregnancy, antepartum; Obesity during pregnancy, antepartum; and Trichomonal vaginitis during pregnancy in first trimester on their problem list.  Patient reports no complaints.  Contractions: Not present. Vag. Bleeding: None.  Movement: Present. Denies leaking of fluid.   The following portions of the patient's history were reviewed and updated as appropriate: allergies, current medications, past family history, past medical history, past social history, past surgical history and problem list.   Objective:   Vitals:   01/02/20 1435  BP: 116/79  Pulse: 79  Weight: (!) 311 lb (141.1 kg)    Fetal Status: Fetal Heart Rate (bpm): 143   Movement: Present     General:  Alert, oriented and cooperative. Patient is in no acute distress.  Skin: Skin is warm and dry. No rash noted.   Cardiovascular: Normal heart rate noted  Respiratory: Normal respiratory effort, no problems with respiration noted  Abdomen: Soft, gravid, appropriate for gestational age.  Pain/Pressure: Absent     Pelvic: Cervical exam deferred        Extremities: Normal range of motion.  Edema: None  Mental Status: Normal mood and affect. Normal behavior. Normal judgment and thought content.   Assessment and Plan:  Pregnancy: G1P0000 at [redacted]w[redacted]d 1. Supervision of normal first pregnancy, antepartum - reviewed initial OB labs; WNL - Anatomy scan ordered - RTC in 4 weeks  - AFP, Serum, Open Spina Bifida - Genetic Screening - Prenat-Fe Poly-Methfol-FA-DHA (VITAFOL ULTRA) 29-0.6-0.4-200 MG CAPS; Take 1 tablet by mouth daily.  2. Obesity during pregnancy, antepartum - cont ASA - Hgb A1c, Pr/Cr and CMP WNL  3.  Trichomonal vaginitis during pregnancy in first trimester - TOC negative 5/27  Preterm labor symptoms and general obstetric precautions including but not limited to vaginal bleeding, contractions, leaking of fluid and fetal movement were reviewed in detail with the patient. Please refer to After Visit Summary for other counseling recommendations.   Return in about 4 weeks (around 01/30/2020) for LOB; in-person or virtual per patient preference .  No future appointments.  Joselyn Arrow, MD

## 2020-01-02 NOTE — Progress Notes (Signed)
error 

## 2020-01-02 NOTE — Progress Notes (Signed)
Patient reports feeling fetal flutter movements, denies pain.

## 2020-01-04 LAB — AFP, SERUM, OPEN SPINA BIFIDA
AFP MoM: 1.83
AFP Value: 40.2 ng/mL
Gest. Age on Collection Date: 15 weeks
Maternal Age At EDD: 29.3 yr
OSBR Risk 1 IN: 2380
Test Results:: NEGATIVE
Weight: 311 [lb_av]

## 2020-01-08 ENCOUNTER — Encounter: Payer: Self-pay | Admitting: Obstetrics and Gynecology

## 2020-01-13 ENCOUNTER — Encounter: Payer: Self-pay | Admitting: Obstetrics and Gynecology

## 2020-01-29 ENCOUNTER — Other Ambulatory Visit: Payer: Self-pay | Admitting: Family Medicine

## 2020-01-29 ENCOUNTER — Other Ambulatory Visit: Payer: Self-pay

## 2020-01-29 ENCOUNTER — Other Ambulatory Visit: Payer: Self-pay | Admitting: *Deleted

## 2020-01-29 ENCOUNTER — Ambulatory Visit: Payer: Medicaid Other | Admitting: *Deleted

## 2020-01-29 ENCOUNTER — Ambulatory Visit: Payer: Medicaid Other | Attending: Family Medicine

## 2020-01-29 ENCOUNTER — Ambulatory Visit: Payer: Medicaid Other

## 2020-01-29 DIAGNOSIS — Z3400 Encounter for supervision of normal first pregnancy, unspecified trimester: Secondary | ICD-10-CM | POA: Diagnosis not present

## 2020-01-29 DIAGNOSIS — Z363 Encounter for antenatal screening for malformations: Secondary | ICD-10-CM

## 2020-01-29 DIAGNOSIS — O99212 Obesity complicating pregnancy, second trimester: Secondary | ICD-10-CM | POA: Diagnosis not present

## 2020-01-29 DIAGNOSIS — O99210 Obesity complicating pregnancy, unspecified trimester: Secondary | ICD-10-CM | POA: Diagnosis present

## 2020-01-29 DIAGNOSIS — Z3A19 19 weeks gestation of pregnancy: Secondary | ICD-10-CM

## 2020-01-29 DIAGNOSIS — Z362 Encounter for other antenatal screening follow-up: Secondary | ICD-10-CM

## 2020-01-30 ENCOUNTER — Ambulatory Visit (INDEPENDENT_AMBULATORY_CARE_PROVIDER_SITE_OTHER): Payer: Medicaid Other

## 2020-01-30 VITALS — BP 120/76 | HR 83 | Wt 315.0 lb

## 2020-01-30 DIAGNOSIS — Z3402 Encounter for supervision of normal first pregnancy, second trimester: Secondary | ICD-10-CM

## 2020-01-30 DIAGNOSIS — O99212 Obesity complicating pregnancy, second trimester: Secondary | ICD-10-CM

## 2020-01-30 DIAGNOSIS — Z3A19 19 weeks gestation of pregnancy: Secondary | ICD-10-CM

## 2020-01-30 DIAGNOSIS — O99210 Obesity complicating pregnancy, unspecified trimester: Secondary | ICD-10-CM

## 2020-01-30 DIAGNOSIS — Z3400 Encounter for supervision of normal first pregnancy, unspecified trimester: Secondary | ICD-10-CM

## 2020-01-30 NOTE — Progress Notes (Signed)
ROB, reports no problems today. 

## 2020-01-30 NOTE — Progress Notes (Signed)
   LOW-RISK PREGNANCY OFFICE VISIT  Patient name: Carmen Price MRN [id number]  Date of birth: July 11, 1990 Chief Complaint:   Routine Prenatal Visit  Subjective:   Carmen Price is a 29 y.o. G52P0000 female at [redacted]w[redacted]d with an Estimated Date of Delivery: 06/24/20 being seen today for ongoing management of a low-risk pregnancy aeb has CHILDHOOD OBESITY; Abnormal uterine bleeding (AUB); ACNE VULGARIS, FACIAL; Supervision of normal first pregnancy, antepartum; Obesity during pregnancy, antepartum; and Trichomonal vaginitis during pregnancy in first trimester on their problem list.  Patient presents today withno complaints. Patient denies vaginal concerns including abnormal discharge, leaking of fluid, and bleeding.  Contractions: Not present.  .  Movement: Present.  Reviewed past medical,surgical, social, obstetrical and family history as well as problem list, medications and allergies.  Objective   Vitals:   01/30/20 1446  BP: 120/76  Pulse: 83  Weight: (!) 315 lb (142.9 kg)  Body mass index is 49.34 kg/m.  Total Weight Gain:55 lb (24.9 kg)         Physical Examination:   General appearance: Well appearing, and in no distress  Mental status: Alert, oriented to person, place, and time  Skin: Warm & dry  Cardiovascular: Normal heart rate noted  Respiratory: Normal respiratory effort, no distress  Abdomen: Soft, gravid, nontender, LGA d/t body habitus. Fundus not assessed.  Pelvic: Cervical exam deferred           Extremities: Edema: Trace  Fetal Status: Fetal Heart Rate (bpm): 153  Movement: Present   No results found for this or any previous visit (from the past 24 hour(s)).  Assessment & Plan:  Low-risk pregnancy of a 29 y.o., G1P0000 at [redacted]w[redacted]d with an Estimated Date of Delivery: 06/24/20   1. Supervision of normal first pregnancy, antepartum -Reviewed desires for PP contraception. -Patient expresses desire for patch. -Given and reviewed pamphlet which shows manufacture does  not support usage overy 198lbs. -Patient informed that she would be given prescription, but that it may not be effective. -Discussed risks and benefits of IUD, particularly PPIUD. -Given pamphlet for Liletta. -Option for return visit in 4 weeks either in person or virtually, opts for virtual.   2. Obesity during pregnancy, antepartum -Taking baby aspirin -Reviewed [place] which was limited d/t body habitus. -Discussed MFM recommendation for Q4Wk Growth [place].    Meds: No orders of the defined types were placed in this encounter.  Labs/procedures today:  Lab Orders  No laboratory test(s) ordered today     Reviewed: Preterm labor symptoms and general obstetric precautions including but not limited to vaginal bleeding, contractions, leaking of fluid and fetal movement were reviewed in detail with the patient.  All questions were answered.  Follow-up: No follow-ups on file.  No orders of the defined types were placed in this encounter.  Cherre Robins MSN, CNM 01/30/2020

## 2020-01-31 ENCOUNTER — Encounter: Payer: Self-pay | Admitting: Obstetrics

## 2020-02-26 ENCOUNTER — Ambulatory Visit: Payer: Medicaid Other | Admitting: *Deleted

## 2020-02-26 ENCOUNTER — Other Ambulatory Visit: Payer: Self-pay

## 2020-02-26 ENCOUNTER — Ambulatory Visit: Payer: Medicaid Other | Attending: Obstetrics and Gynecology

## 2020-02-26 DIAGNOSIS — Z362 Encounter for other antenatal screening follow-up: Secondary | ICD-10-CM | POA: Diagnosis not present

## 2020-02-26 DIAGNOSIS — O99210 Obesity complicating pregnancy, unspecified trimester: Secondary | ICD-10-CM | POA: Diagnosis present

## 2020-02-26 DIAGNOSIS — O99212 Obesity complicating pregnancy, second trimester: Secondary | ICD-10-CM

## 2020-02-26 DIAGNOSIS — Z3400 Encounter for supervision of normal first pregnancy, unspecified trimester: Secondary | ICD-10-CM | POA: Diagnosis present

## 2020-02-26 DIAGNOSIS — Z3A23 23 weeks gestation of pregnancy: Secondary | ICD-10-CM

## 2020-02-27 ENCOUNTER — Other Ambulatory Visit: Payer: Self-pay | Admitting: *Deleted

## 2020-02-27 ENCOUNTER — Telehealth (INDEPENDENT_AMBULATORY_CARE_PROVIDER_SITE_OTHER): Payer: Medicaid Other | Admitting: Women's Health

## 2020-02-27 ENCOUNTER — Encounter: Payer: Self-pay | Admitting: Women's Health

## 2020-02-27 DIAGNOSIS — O99210 Obesity complicating pregnancy, unspecified trimester: Secondary | ICD-10-CM

## 2020-02-27 DIAGNOSIS — Z3A23 23 weeks gestation of pregnancy: Secondary | ICD-10-CM

## 2020-02-27 DIAGNOSIS — Z3400 Encounter for supervision of normal first pregnancy, unspecified trimester: Secondary | ICD-10-CM

## 2020-02-27 DIAGNOSIS — O99212 Obesity complicating pregnancy, second trimester: Secondary | ICD-10-CM

## 2020-02-27 DIAGNOSIS — A5901 Trichomonal vulvovaginitis: Secondary | ICD-10-CM

## 2020-02-27 DIAGNOSIS — O98312 Other infections with a predominantly sexual mode of transmission complicating pregnancy, second trimester: Secondary | ICD-10-CM

## 2020-02-27 DIAGNOSIS — E669 Obesity, unspecified: Secondary | ICD-10-CM

## 2020-02-27 NOTE — Patient Instructions (Addendum)
Maternity Assessment Unit (MAU)  The Maternity Assessment Unit (MAU) is located at the [facility] at [facility]. The address is: [address], [place], [place], [place] [address]. Please see map below for additional directions.    The Maternity Assessment Unit is designed to help you during your pregnancy, and for up to 6 weeks after delivery, with any pregnancy- or postpartum-related emergencies, if you think you are in labor, or if your water has broken. For example, if you experience nausea and vomiting, vaginal bleeding, severe abdominal or pelvic pain, elevated blood pressure or other problems related to your pregnancy or postpartum time, please come to the Maternity Assessment Unit for assistance.        Glucose Tolerance Test During Pregnancy Why am I having this test? The glucose tolerance test (GTT) is done to check how your body processes sugar (glucose). This is one of several tests used to diagnose diabetes that develops during pregnancy (gestational diabetes mellitus). Gestational diabetes is a temporary form of diabetes that some women develop during pregnancy. It usually occurs during the second trimester of pregnancy and goes away after delivery. Testing (screening) for gestational diabetes usually occurs between 24 and 28 weeks of pregnancy. You may have the GTT test after having a 1-hour glucose screening test if the results from that test indicate that you may have gestational diabetes. You may also have this test if:  You have a history of gestational diabetes.  You have a history of giving birth to very large babies or have experienced repeated fetal loss (stillbirth).  You have signs and symptoms of diabetes, such as: ? Changes in your vision. ? Tingling or numbness in your hands or feet. ? Changes in hunger, thirst, and urination that are not otherwise explained by your pregnancy. What is being tested? This test measures the  amount of glucose in your blood at different times during a period of 3 hours. This indicates how well your body is able to process glucose. What kind of sample is taken?  Blood samples are required for this test. They are usually collected by inserting a needle into a blood vessel. How do I prepare for this test?  For 3 days before your test, eat normally. Have plenty of carbohydrate-rich foods.  Follow instructions from your health care provider about: ? Eating or drinking restrictions on the day of the test. You may be asked to not eat or drink anything other than water (fast) starting 8-10 hours before the test. ? Changing or stopping your regular medicines. Some medicines may interfere with this test. Tell a health care provider about:  All medicines you are taking, including vitamins, herbs, eye drops, creams, and over-the-counter medicines.  Any blood disorders you have.  Any surgeries you have had.  Any medical conditions you have. What happens during the test? First, your blood glucose will be measured. This is referred to as your fasting blood glucose, since you fasted before the test. Then, you will drink a glucose solution that contains a certain amount of glucose. Your blood glucose will be measured again 1, 2, and 3 hours after drinking the solution. This test takes about 3 hours to complete. You will need to stay at the testing location during this time. During the testing period:  Do not eat or drink anything other than the glucose solution.  Do not exercise.  Do not use any products that contain nicotine or tobacco, such as cigarettes and e-cigarettes. If  you need help stopping, ask your health care provider. The testing procedure may vary among health care providers and hospitals. How are the results reported? Your results will be reported as milligrams of glucose per deciliter of blood (mg/dL) or millimoles per liter (mmol/L). Your health care provider will compare  your results to normal ranges that were established after testing a large group of people (reference ranges). Reference ranges may vary among labs and hospitals. For this test, common reference ranges are:  Fasting: less than 95-105 mg/dL (8.7-5.6 mmol/L).  1 hour after drinking glucose: less than 180-190 mg/dL (43.3-29.5 mmol/L).  2 hours after drinking glucose: less than 155-165 mg/dL (1.8-8.4 mmol/L).  3 hours after drinking glucose: 140-145 mg/dL (1.6-6.0 mmol/L). What do the results mean? Results within reference ranges are considered normal, meaning that your glucose levels are well-controlled. If two or more of your blood glucose levels are high, you may be diagnosed with gestational diabetes. If only one level is high, your health care provider may suggest repeat testing or other tests to confirm a diagnosis. Talk with your health care provider about what your results mean. Questions to ask your health care provider Ask your health care provider, or the department that is doing the test:  When will my results be ready?  How will I get my results?  What are my treatment options?  What other tests do I need?  What are my next steps? Summary  The glucose tolerance test (GTT) is one of several tests used to diagnose diabetes that develops during pregnancy (gestational diabetes mellitus). Gestational diabetes is a temporary form of diabetes that some women develop during pregnancy.  You may have the GTT test after having a 1-hour glucose screening test if the results from that test indicate that you may have gestational diabetes. You may also have this test if you have any symptoms or risk factors for gestational diabetes.  Talk with your health care provider about what your results mean. This information is not intended to replace advice given to you by your health care provider. Make sure you discuss any questions you have with your health care provider. Document Revised:  10/11/2018 Document Reviewed: 01/30/2017 Elsevier Patient Education  2020 Elsevier Inc.      AREA PEDIATRIC/FAMILY PRACTICE PHYSICIANS  [facility] [address] [place], [place] [address] Phone - [phone]   Fax - [phone]  JACK AMOS [address] [place], [place]  [address] Phone - [phone]   Fax - [phone]  [facility] [address] [place], [place]  [address] Phone - [phone]   Fax - [phone]  [facility] [address] [place], [place]  [address] Phone - [phone]   Fax - [phone]  [facility] [address] [place], [place]  [address] Phone - [phone]   Fax - [phone]  [facility] [address] [place], [place]  [address] Phone - [phone]   Fax - [phone]  [facility] [address] [place], [place]  [address] Phone - [phone]   Fax - [phone]  [facility] AT [facility] [address] [place], [address] [place], [place]  [address] Phone - [phone]   Fax - [phone]  [facility] AT [facility] [address] [place], [place]  [address] Phone - [phone]   Fax - [phone] [facility] AT [place] [address]  [place], [place]  [address] Phone - [phone]   Fax - [phone]  [facility] [address] [place], [place]  [address] Phone - [phone]   Fax - [phone]  [facility] [address] [place], [place]  [address] Phone - [phone]   Fax - [phone]  [facility] [address] [place], [place]  [address] Phone - [phone]   Fax - [phone]  [facility] [address] [place], [place]  [address] Phone - [phone]  [facility] [address] [place],  [place]  [address] Phone - [phone]   Fax - [phone]  [facility] [address] [place], [place]  [address] Phone - [phone]   Fax - [phone]  [facility] [address] [place], [place]  [address] Phone - [phone]   Fax - [phone]  [facility] [address] [place], [place]  [address] Phone - [phone]   Fax - [phone]   [facility] [address] [place], [place]  [address] Phone - [phone]   Fax - [phone]  [facility] [address] [place], [place]  [address] Phone - [phone]   Fax - [phone]  DAVID RUBIN [address] [place], [place]  [address] Phone - [phone]   Fax - [phone]  [facility] [address] [place], [place]  [address] Phone - [phone]   Fax - [phone]  [facility] [address] [place], [place]  [address] Phone - [phone]   Fax - [phone] Gerarda Fraction [address] [place], [place]  [address] Phone - [phone]   Fax - [phone]  [facility] [address] [place], [place]  [address] Phone - [phone]   Fax - [phone]  [facility] - [place] [address] [place], [place]  [address] Phone - [phone]   Fax - [phone]         Third Trimester of Pregnancy The third trimester is from week 28 through week 40 (months 7 through 9). The third trimester is a time when the unborn baby (fetus) is growing rapidly. At the end of the ninth month, the fetus is about 20 inches in length and weighs 6-10 pounds. Body changes during your third trimester Your body will continue to go through many changes during pregnancy. The changes vary from woman to woman. During the third trimester:  Your weight will continue to increase. You can expect to gain 25-35  pounds (11-16 kg) by the end of the pregnancy.  You may begin to get stretch marks on your hips, abdomen, and breasts.  You may urinate more often because the fetus is moving lower into your pelvis and pressing on your bladder.  You may develop or continue to have heartburn. This is caused by increased hormones that slow down muscles in the digestive tract.  You may develop or continue to have constipation because increased hormones slow digestion and cause the muscles that push waste through your intestines to relax.  You may develop hemorrhoids. These are swollen veins (varicose veins) in the rectum that can itch or be painful.  You may develop swollen, bulging veins (varicose veins) in your legs.  You may have increased body aches in the pelvis, back, or thighs. This is due to weight gain and increased hormones  that are relaxing your joints.  You may have changes in your hair. These can include thickening of your hair, rapid growth, and changes in texture. Some women also have hair loss during or after pregnancy, or hair that feels dry or thin. Your hair will most likely return to normal after your baby is born.  Your breasts will continue to grow and they will continue to become tender. A yellow fluid (colostrum) may leak from your breasts. This is the first milk you are producing for your baby.  Your belly button may stick out.  You may notice more swelling in your hands, face, or ankles.  You may have increased tingling or numbness in your hands, arms, and legs. The skin on your belly may also feel numb.  You may feel short of breath because of your expanding uterus.  You may have more problems sleeping. This can be caused by the size of your belly, increased need to urinate, and an increase in your body's metabolism.  You may notice the fetus "dropping," or moving lower in your abdomen (lightening).  You may have increased vaginal discharge.  You may notice your joints feel  loose and you may have pain around your pelvic bone. What to expect at prenatal visits You will have prenatal exams every 2 weeks until week 36. Then you will have weekly prenatal exams. During a routine prenatal visit:  You will be weighed to make sure you and the baby are growing normally.  Your blood pressure will be taken.  Your abdomen will be measured to track your baby's growth.  The fetal heartbeat will be listened to.  Any test results from the previous visit will be discussed.  You may have a cervical check near your due date to see if your cervix has softened or thinned (effaced).  You will be tested for Group B streptococcus. This happens between 35 and 37 weeks. Your health care provider may ask you:  What your birth plan is.  How you are feeling.  If you are feeling the baby move.  If you have had any abnormal symptoms, such as leaking fluid, bleeding, severe headaches, or abdominal cramping.  If you are using any tobacco products, including cigarettes, chewing tobacco, and electronic cigarettes.  If you have any questions. Other tests or screenings that may be performed during your third trimester include:  Blood tests that check for low iron levels (anemia).  Fetal testing to check the health, activity level, and growth of the fetus. Testing is done if you have certain medical conditions or if there are problems during the pregnancy.  Nonstress test (NST). This test checks the health of your baby to make sure there are no signs of problems, such as the baby not getting enough oxygen. During this test, a belt is placed around your belly. The baby is made to move, and its heart rate is monitored during movement. What is false labor? False labor is a condition in which you feel small, irregular tightenings of the muscles in the womb (contractions) that usually go away with rest, changing position, or drinking water. These are called Braxton Hicks contractions.  Contractions may last for hours, days, or even weeks before true labor sets in. If contractions come at regular intervals, become more frequent, increase in intensity, or become painful, you should see your health care provider. What are the signs of labor?  Abdominal cramps.  Regular contractions that start at 10 minutes apart and become stronger and  more frequent with time.  Contractions that start on the top of the uterus and spread down to the lower abdomen and back.  Increased pelvic pressure and dull back pain.  A watery or bloody mucus discharge that comes from the vagina.  Leaking of amniotic fluid. This is also known as your "water breaking." It could be a slow trickle or a gush. Let your health care provider know if it has a color or strange odor. If you have any of these signs, call your health care provider right away, even if it is before your due date. Follow these instructions at home: Medicines  Follow your health care provider's instructions regarding medicine use. Specific medicines may be either safe or unsafe to take during pregnancy.  Take a prenatal vitamin that contains at least 600 micrograms (mcg) of folic acid.  If you develop constipation, try taking a stool softener if your health care provider approves. Eating and drinking   Eat a balanced diet that includes fresh fruits and vegetables, whole grains, good sources of protein such as meat, eggs, or tofu, and low-fat dairy. Your health care provider will help you determine the amount of weight gain that is right for you.  Avoid raw meat and uncooked cheese. These carry germs that can cause birth defects in the baby.  If you have low calcium intake from food, talk to your health care provider about whether you should take a daily calcium supplement.  Eat four or five small meals rather than three large meals a day.  Limit foods that are high in fat and processed sugars, such as fried and sweet foods.  To  prevent constipation: ? Drink enough fluid to keep your urine clear or pale yellow. ? Eat foods that are high in fiber, such as fresh fruits and vegetables, whole grains, and beans. Activity  Exercise only as directed by your health care provider. Most women can continue their usual exercise routine during pregnancy. Try to exercise for 30 minutes at least 5 days a week. Stop exercising if you experience uterine contractions.  Avoid heavy lifting.  Do not exercise in extreme heat or humidity, or at high altitudes.  Wear low-heel, comfortable shoes.  Practice good posture.  You may continue to have sex unless your health care provider tells you otherwise. Relieving pain and discomfort  Take frequent breaks and rest with your legs elevated if you have leg cramps or low back pain.  Take warm sitz baths to soothe any pain or discomfort caused by hemorrhoids. Use hemorrhoid cream if your health care provider approves.  Wear a good support bra to prevent discomfort from breast tenderness.  If you develop varicose veins: ? Wear support pantyhose or compression stockings as told by your healthcare provider. ? Elevate your feet for 15 minutes, 3-4 times a day. Prenatal care  Write down your questions. Take them to your prenatal visits.  Keep all your prenatal visits as told by your health care provider. This is important. Safety  Wear your seat belt at all times when driving.  Make a list of emergency phone numbers, including numbers for family, friends, the hospital, and police and fire departments. General instructions  Avoid cat litter boxes and soil used by cats. These carry germs that can cause birth defects in the baby. If you have a cat, ask someone to clean the litter box for you.  Do not travel far distances unless it is absolutely necessary and only with the approval of your  health care provider.  Do not use hot tubs, steam rooms, or saunas.  Do not drink alcohol.  Do  not use any products that contain nicotine or tobacco, such as cigarettes and e-cigarettes. If you need help quitting, ask your health care provider.  Do not use any medicinal herbs or unprescribed drugs. These chemicals affect the formation and growth of the baby.  Do not douche or use tampons or scented sanitary pads.  Do not cross your legs for long periods of time.  To prepare for the arrival of your baby: ? Take prenatal classes to understand, practice, and ask questions about labor and delivery. ? Make a trial run to the hospital. ? Visit the hospital and tour the maternity area. ? Arrange for maternity or paternity leave through employers. ? Arrange for family and friends to take care of pets while you are in the hospital. ? Purchase a rear-facing car seat and make sure you know how to install it in your car. ? Pack your hospital bag. ? Prepare the baby's nursery. Make sure to remove all pillows and stuffed animals from the baby's crib to prevent suffocation.  Visit your dentist if you have not gone during your pregnancy. Use a soft toothbrush to brush your teeth and be gentle when you floss. Contact a health care provider if:  You are unsure if you are in labor or if your water has broken.  You become dizzy.  You have mild pelvic cramps, pelvic pressure, or nagging pain in your abdominal area.  You have lower back pain.  You have persistent nausea, vomiting, or diarrhea.  You have an unusual or bad smelling vaginal discharge.  You have pain when you urinate. Get help right away if:  Your water breaks before 37 weeks.  You have regular contractions less than 5 minutes apart before 37 weeks.  You have a fever.  You are leaking fluid from your vagina.  You have spotting or bleeding from your vagina.  You have severe abdominal pain or cramping.  You have rapid weight loss or weight gain.  You have shortness of breath with chest pain.  You notice sudden or  extreme swelling of your face, hands, ankles, feet, or legs.  Your baby makes fewer than 10 movements in 2 hours.  You have severe headaches that do not go away when you take medicine.  You have vision changes. Summary  The third trimester is from week 28 through week 40, months 7 through 9. The third trimester is a time when the unborn baby (fetus) is growing rapidly.  During the third trimester, your discomfort may increase as you and your baby continue to gain weight. You may have abdominal, leg, and back pain, sleeping problems, and an increased need to urinate.  During the third trimester your breasts will keep growing and they will continue to become tender. A yellow fluid (colostrum) may leak from your breasts. This is the first milk you are producing for your baby.  False labor is a condition in which you feel small, irregular tightenings of the muscles in the womb (contractions) that eventually go away. These are called Braxton Hicks contractions. Contractions may last for hours, days, or even weeks before true labor sets in.  Signs of labor can include: abdominal cramps; regular contractions that start at 10 minutes apart and become stronger and more frequent with time; watery or bloody mucus discharge that comes from the vagina; increased pelvic pressure and dull back pain; and  leaking of amniotic fluid. This information is not intended to replace advice given to you by your health care provider. Make sure you discuss any questions you have with your health care provider. Document Revised: 10/11/2018 Document Reviewed: 07/26/2016 Elsevier Patient Education  2020 Elsevier Inc.        Round Ligament Pain  The round ligament is a cord of muscle and tissue that helps support the uterus. It can become a source of pain during pregnancy if it becomes stretched or twisted as the baby grows. The pain usually begins in the second trimester (13-28 weeks) of pregnancy, and it can come  and go until the baby is delivered. It is not a serious problem, and it does not cause harm to the baby. Round ligament pain is usually a short, sharp, and pinching pain, but it can also be a dull, lingering, and aching pain. The pain is felt in the lower side of the abdomen or in the groin. It usually starts deep in the groin and moves up to the outside of the hip area. The pain may occur when you:  Suddenly change position, such as quickly going from a sitting to standing position.  Roll over in bed.  Cough or sneeze.  Do physical activity. Follow these instructions at home:   Watch your condition for any changes.  When the pain starts, relax. Then try any of these methods to help with the pain: ? Sitting down. ? Flexing your knees up to your abdomen. ? Lying on your side with one pillow under your abdomen and another pillow between your legs. ? Sitting in a warm bath for 15-20 minutes or until the pain goes away.  Take over-the-counter and prescription medicines only as told by your health care provider.  Move slowly when you sit down or stand up.  Avoid long walks if they cause pain.  Stop or reduce your physical activities if they cause pain.  Keep all follow-up visits as told by your health care provider. This is important. Contact a health care provider if:  Your pain does not go away with treatment.  You feel pain in your back that you did not have before.  Your medicine is not helping. Get help right away if:  You have a fever or chills.  You develop uterine contractions.  You have vaginal bleeding.  You have nausea or vomiting.  You have diarrhea.  You have pain when you urinate. Summary  Round ligament pain is felt in the lower abdomen or groin. It is usually a short, sharp, and pinching pain. It can also be a dull, lingering, and aching pain.  This pain usually begins in the second trimester (13-28 weeks). It occurs because the uterus is stretching  with the growing baby, and it is not harmful to the baby.  You may notice the pain when you suddenly change position, when you cough or sneeze, or during physical activity.  Relaxing, flexing your knees to your abdomen, lying on one side, or taking a warm bath may help to get rid of the pain.  Get help from your health care provider if the pain does not go away or if you have vaginal bleeding, nausea, vomiting, diarrhea, or painful urination. This information is not intended to replace advice given to you by your health care provider. Make sure you discuss any questions you have with your health care provider. Document Revised: 12/06/2017 Document Reviewed: 12/06/2017 Elsevier Patient Education  2020 [employer].  Preterm Labor and Birth Information  The normal length of a pregnancy is 39-41 weeks. Preterm labor is when labor starts before 37 completed weeks of pregnancy. What are the risk factors for preterm labor? Preterm labor is more likely to occur in women who:  Have certain infections during pregnancy such as a bladder infection, sexually transmitted infection, or infection inside the uterus (chorioamnionitis).  Have a shorter-than-normal cervix.  Have gone into preterm labor before.  Have had surgery on their cervix.  Are younger than age 11 or older than age 34.  Are African American.  Are pregnant with twins or multiple babies (multiple gestation).  Take street drugs or smoke while pregnant.  Do not gain enough weight while pregnant.  Became pregnant shortly after having been pregnant. What are the symptoms of preterm labor? Symptoms of preterm labor include:  Cramps similar to those that can happen during a menstrual period. The cramps may happen with diarrhea.  Pain in the abdomen or lower back.  Regular uterine contractions that may feel like tightening of the abdomen.  A feeling of increased pressure in the pelvis.  Increased watery or bloody  mucus discharge from the vagina.  Water breaking (ruptured amniotic sac). Why is it important to recognize signs of preterm labor? It is important to recognize signs of preterm labor because babies who are born prematurely may not be fully developed. This can put them at an increased risk for:  Long-term (chronic) heart and lung problems.  Difficulty immediately after birth with regulating body systems, including blood sugar, body temperature, heart rate, and breathing rate.  Bleeding in the brain.  Cerebral palsy.  Learning difficulties.  Death. These risks are highest for babies who are born before 34 weeks of pregnancy. How is preterm labor treated? Treatment depends on the length of your pregnancy, your condition, and the health of your baby. It may involve:  Having a stitch (suture) placed in your cervix to prevent your cervix from opening too early (cerclage).  Taking or being given medicines, such as: ? Hormone medicines. These may be given early in pregnancy to help support the pregnancy. ? Medicine to stop contractions. ? Medicines to help mature the baby's lungs. These may be prescribed if the risk of delivery is high. ? Medicines to prevent your baby from developing cerebral palsy. If the labor happens before 34 weeks of pregnancy, you may need to stay in the hospital. What should I do if I think I am in preterm labor? If you think that you are going into preterm labor, call your health care provider right away. How can I prevent preterm labor in future pregnancies? To increase your chance of having a full-term pregnancy:  Do not use any tobacco products, such as cigarettes, chewing tobacco, and e-cigarettes. If you need help quitting, ask your health care provider.  Do not use street drugs or medicines that have not been prescribed to you during your pregnancy.  Talk with your health care provider before taking any herbal supplements, even if you have been taking  them regularly.  Make sure you gain a healthy amount of weight during your pregnancy.  Watch for infection. If you think that you might have an infection, get it checked right away.  Make sure to tell your health care provider if you have gone into preterm labor before. This information is not intended to replace advice given to you by your health care provider. Make sure you discuss any questions you have with  your health care provider. Document Revised: 10/12/2018 Document Reviewed: 11/11/2015 Elsevier Patient Education  2020 [employer]

## 2020-02-27 NOTE — Progress Notes (Signed)
Virtual ROB    CC: Swelling in feet.

## 2020-02-27 NOTE — Progress Notes (Addendum)
I connected with Carmen Price 02/27/20 at  3:20 PM EDT by: MyChart video and verified that I am speaking with the correct person using two identifiers.  Patient is located at work and provider is located at [facility].     The purpose of this virtual visit is to provide medical care while limiting exposure to the novel coronavirus. I discussed the limitations, risks, security and privacy concerns of performing an evaluation and management service by MyChart video and the availability of in person appointments. I also discussed with the patient that there may be a patient responsible charge related to this service. By engaging in this virtual visit, you consent to the provision of healthcare.  Additionally, you authorize for your insurance to be billed for the services provided during this visit.  The patient expressed understanding and agreed to proceed.  The following staff members participated in the virtual visit:  Donia Ast    PRENATAL VISIT NOTE  Subjective:  Carmen Price is a 29 y.o. G1P0000 at [redacted]w[redacted]d  for phone visit for ongoing prenatal care.  She is currently monitored for the following issues for this low-risk pregnancy and has CHILDHOOD OBESITY; Abnormal uterine bleeding (AUB); ACNE VULGARIS, FACIAL; Supervision of normal first pregnancy, antepartum; Obesity during pregnancy, antepartum; and Trichomonal vaginitis during pregnancy in first trimester on their problem list.  Patient reports no complaints.  Contractions: Not present. Vag. Bleeding: None.  Movement: Present. Denies leaking of fluid.   The following portions of the patient's history were reviewed and updated as appropriate: allergies, current medications, past family history, past medical history, past social history, past surgical history and problem list.   Objective:  There were no vitals filed for this visit. Pt unable to take BP today, but BP was 118/80, P 91 yesterday at MFM for [place].  Fetal Status:      Movement: Present     Assessment and Plan:  Pregnancy: G1P0000 at [redacted]w[redacted]d  1. Obesity during pregnancy, antepartum - on low dose ASA  2. Supervision of normal first pregnancy, antepartum - anticipatory guidance given on upcoming visit GTT/labs - peds list given  3. Trichomonal vaginitis during pregnancy in first trimester -TX 5/3, TOC negative  4. [redacted] weeks gestation of pregnancy  Preterm labor symptoms and general obstetric precautions including but not limited to vaginal bleeding, contractions, leaking of fluid and fetal movement were reviewed in detail with the patient. I discussed the assessment and treatment plan with the patient. The patient was provided an opportunity to ask questions and all were answered. The patient agreed with the plan and demonstrated an understanding of the instructions. The patient was advised to call back or seek an in-person office evaluation/go to MAU at [facility] for any urgent or concerning symptoms.  Return in about 4 weeks (around 03/26/2020) for in-person LOB/APP OK/GTT/labs.  Future Appointments  Date Time Provider Department Center  02/27/2020  3:20 PM Marylen Ponto, NP [facility] None  03/25/2020  3:00 PM [facility] NURSE [facility]  03/25/2020  3:15 PM [facility] US3 [facility]     Time spent on virtual visit: 5 minutes  Marylen Ponto, NP

## 2020-03-25 ENCOUNTER — Ambulatory Visit: Payer: Medicaid Other | Attending: Obstetrics and Gynecology

## 2020-03-25 ENCOUNTER — Ambulatory Visit: Payer: Medicaid Other | Admitting: *Deleted

## 2020-03-25 ENCOUNTER — Encounter: Payer: Self-pay | Admitting: *Deleted

## 2020-03-25 ENCOUNTER — Other Ambulatory Visit: Payer: Self-pay

## 2020-03-25 DIAGNOSIS — Z3A27 27 weeks gestation of pregnancy: Secondary | ICD-10-CM

## 2020-03-25 DIAGNOSIS — Z362 Encounter for other antenatal screening follow-up: Secondary | ICD-10-CM

## 2020-03-25 DIAGNOSIS — Z3400 Encounter for supervision of normal first pregnancy, unspecified trimester: Secondary | ICD-10-CM

## 2020-03-25 DIAGNOSIS — O99210 Obesity complicating pregnancy, unspecified trimester: Secondary | ICD-10-CM | POA: Diagnosis not present

## 2020-03-26 ENCOUNTER — Other Ambulatory Visit: Payer: Self-pay | Admitting: *Deleted

## 2020-03-26 ENCOUNTER — Other Ambulatory Visit: Payer: Medicaid Other

## 2020-03-26 ENCOUNTER — Encounter: Payer: Medicaid Other | Admitting: Nurse Practitioner

## 2020-03-26 DIAGNOSIS — O99210 Obesity complicating pregnancy, unspecified trimester: Secondary | ICD-10-CM

## 2020-03-31 ENCOUNTER — Encounter: Payer: Self-pay | Admitting: Obstetrics

## 2020-03-31 ENCOUNTER — Ambulatory Visit (INDEPENDENT_AMBULATORY_CARE_PROVIDER_SITE_OTHER): Payer: Medicaid Other | Admitting: Obstetrics

## 2020-03-31 ENCOUNTER — Other Ambulatory Visit: Payer: Medicaid Other

## 2020-03-31 ENCOUNTER — Other Ambulatory Visit: Payer: Self-pay

## 2020-03-31 VITALS — BP 136/83 | HR 89 | Wt 319.0 lb

## 2020-03-31 DIAGNOSIS — Z3A27 27 weeks gestation of pregnancy: Secondary | ICD-10-CM

## 2020-03-31 DIAGNOSIS — O99213 Obesity complicating pregnancy, third trimester: Secondary | ICD-10-CM

## 2020-03-31 DIAGNOSIS — E669 Obesity, unspecified: Secondary | ICD-10-CM

## 2020-03-31 DIAGNOSIS — Z3400 Encounter for supervision of normal first pregnancy, unspecified trimester: Secondary | ICD-10-CM

## 2020-03-31 NOTE — Progress Notes (Signed)
Subjective:  Carmen Price is a 29 y.o. G1P0000 at [redacted]w[redacted]d being seen today for ongoing prenatal care.  She is currently monitored for the following issues for this low-risk pregnancy and has CHILDHOOD OBESITY; Abnormal uterine bleeding (AUB); ACNE VULGARIS, FACIAL; Supervision of normal first pregnancy, antepartum; Obesity during pregnancy, antepartum; and Trichomonal vaginitis during pregnancy in first trimester on their problem list.  Patient reports no complaints.  Contractions: Not present. Vag. Bleeding: None.  Movement: Present. Denies leaking of fluid.   The following portions of the patient's history were reviewed and updated as appropriate: allergies, current medications, past family history, past medical history, past social history, past surgical history and problem list. Problem list updated.  Objective:   Vitals:   03/31/20 1032  BP: 136/83  Pulse: 89  Weight: (!) 319 lb (144.7 kg)    Fetal Status:     Movement: Present     General:  Alert, oriented and cooperative. Patient is in no acute distress.  Skin: Skin is warm and dry. No rash noted.   Cardiovascular: Normal heart rate noted  Respiratory: Normal respiratory effort, no problems with respiration noted  Abdomen: Soft, gravid, appropriate for gestational age. Pain/Pressure: Absent     Pelvic:  Cervical exam deferred        Extremities: Normal range of motion.  Edema: Trace  Mental Status: Normal mood and affect. Normal behavior. Normal judgment and thought content.   Urinalysis:      Assessment and Plan:  Pregnancy: G1P0000 at [redacted]w[redacted]d  1. Supervision of normal first pregnancy, antepartum Rx: - Glucose Tolerance, 2 Hours w/1 Hour - CBC - HIV Antibody (routine testing w rflx) - RPR  Preterm labor symptoms and general obstetric precautions including but not limited to vaginal bleeding, contractions, leaking of fluid and fetal movement were reviewed in detail with the patient. Please refer to After Visit Summary  for other counseling recommendations.   Return in about 2 weeks (around 04/14/2020) for MyChart.   Brock Bad, MD  03/31/20

## 2020-03-31 NOTE — Progress Notes (Signed)
Patient presents for ROB and GTT. No concerns today. Declines flu vaccine and will defer TDAP vaccine until next visit.

## 2020-04-01 LAB — CBC
Hematocrit: 34.9 % (ref 34.0–46.6)
Hemoglobin: 12 g/dL (ref 11.1–15.9)
MCH: 32.3 pg (ref 26.6–33.0)
MCHC: 34.4 g/dL (ref 31.5–35.7)
MCV: 94 fL (ref 79–97)
Platelets: 216 10*3/uL (ref 150–450)
RBC: 3.72 x10E6/uL — ABNORMAL LOW (ref 3.77–5.28)
RDW: 12.6 % (ref 11.7–15.4)
WBC: 5.8 10*3/uL (ref 3.4–10.8)

## 2020-04-01 LAB — HIV ANTIBODY (ROUTINE TESTING W REFLEX): HIV Screen 4th Generation wRfx: NONREACTIVE

## 2020-04-01 LAB — GLUCOSE TOLERANCE, 2 HOURS W/ 1HR
Glucose, 1 hour: 114 mg/dL (ref 65–179)
Glucose, 2 hour: 88 mg/dL (ref 65–152)
Glucose, Fasting: 71 mg/dL (ref 65–91)

## 2020-04-01 LAB — RPR: RPR Ser Ql: NONREACTIVE

## 2020-04-14 ENCOUNTER — Encounter: Payer: Self-pay | Admitting: Obstetrics

## 2020-04-14 ENCOUNTER — Telehealth (INDEPENDENT_AMBULATORY_CARE_PROVIDER_SITE_OTHER): Payer: Medicaid Other | Admitting: Obstetrics

## 2020-04-14 ENCOUNTER — Telehealth: Payer: Medicaid Other | Admitting: Obstetrics and Gynecology

## 2020-04-14 VITALS — BP 145/89 | HR 103

## 2020-04-14 DIAGNOSIS — Z3A29 29 weeks gestation of pregnancy: Secondary | ICD-10-CM

## 2020-04-14 DIAGNOSIS — Z3400 Encounter for supervision of normal first pregnancy, unspecified trimester: Secondary | ICD-10-CM

## 2020-04-14 DIAGNOSIS — O99210 Obesity complicating pregnancy, unspecified trimester: Secondary | ICD-10-CM

## 2020-04-14 DIAGNOSIS — E669 Obesity, unspecified: Secondary | ICD-10-CM

## 2020-04-14 DIAGNOSIS — O99891 Other specified diseases and conditions complicating pregnancy: Secondary | ICD-10-CM

## 2020-04-14 DIAGNOSIS — M549 Dorsalgia, unspecified: Secondary | ICD-10-CM

## 2020-04-14 DIAGNOSIS — R030 Elevated blood-pressure reading, without diagnosis of hypertension: Secondary | ICD-10-CM

## 2020-04-14 DIAGNOSIS — O99213 Obesity complicating pregnancy, third trimester: Secondary | ICD-10-CM

## 2020-04-14 MED ORDER — COMFORT FIT MATERNITY SUPP SM MISC: 0 refills | Status: DC

## 2020-04-14 NOTE — Progress Notes (Addendum)
OBSTETRICS PRENATAL VIRTUAL VISIT ENCOUNTER NOTE  Provider location: [facility] [place]   I connected with [name] on 04/14/20 at  3:30 PM EDT by MyChart Video Encounter at home and verified that I am speaking with the correct person using two identifiers.   I discussed the limitations, risks, security and privacy concerns of performing an evaluation and management service virtually and the availability of in person appointments. I also discussed with the patient that there may be a patient responsible charge related to this service. The patient expressed understanding and agreed to proceed. Subjective:  Carmen Price is a 29 y.o. G1P0000 at [redacted]w[redacted]d being seen today for ongoing prenatal care.  She is currently monitored for the following issues for this low-risk pregnancy and has CHILDHOOD OBESITY; Abnormal uterine bleeding (AUB); ACNE VULGARIS, FACIAL; Supervision of normal first pregnancy, antepartum; Obesity during pregnancy, antepartum; and Trichomonal vaginitis during pregnancy in first trimester on their problem list.  Patient reports backache.  Contractions: Not present. Vag. Bleeding: None.  Movement: Present. Denies any leaking of fluid.   The following portions of the patient's history were reviewed and updated as appropriate: allergies, current medications, past family history, past medical history, past social history, past surgical history and problem list.   Objective:   Vitals:   04/14/20 1533  BP: (!) 145/89  Pulse: (!) 103    Fetal Status:     Movement: Present     General:  Alert, oriented and cooperative. Patient is in no acute distress.  Respiratory: Normal respiratory effort, no problems with respiration noted  Mental Status: Normal mood and affect. Normal behavior. Normal judgment and thought content.  Rest of physical exam deferred due to type of encounter  Imaging: [place] [facility]FM OB FOLLOW UP  Result Date:  03/25/2020 ----------------------------------------------------------------------  OBSTETRICS REPORT                       (Signed Final 03/25/2020 04:31 pm) ---------------------------------------------------------------------- Patient Info  ID #:       [id number]                          D.O.B.:  [birth year] (29 yrs)  Name:       Carmen Price                Visit Date: 03/25/2020 03:27 pm ---------------------------------------------------------------------- Performed By  Attending:        Noralee Space MD        Ref. Address:     [address]                                                             [place], [place]                                                             [address]  Performed By:     Tommie Raymond BS,       [facility]  Referred By:      [facility] ---------------------------------------------------------------------- Orders  #  Description                           Code        Ordered By  1  [place] MFM OB FOLLOW UP                   [id number]    Noralee Space ----------------------------------------------------------------------  #  Order #                     Accession #                Episode #  1  [id number]                   [id number]                 [id number] ---------------------------------------------------------------------- Indications  [redacted] weeks gestation of pregnancy                Z3A.27  Maternal morbid obesity (BMI=40)               O99.210 E66.01  Negative AFP  Low risk NIPS  Encounter for other antenatal screening        Z36.2  follow-up ---------------------------------------------------------------------- Vital Signs                                                 Height:        5'7"  ---------------------------------------------------------------------- Fetal Evaluation  Num Of Fetuses:         1  Fetal Heart Rate(bpm):  151  Cardiac Activity:       Observed  Presentation:           Cephalic  Placenta:               Anterior  P. Cord Insertion:      Previously Visualized  Amniotic Fluid  AFI FV:      Within normal limits                              Largest Pocket(cm)  5.4 ---------------------------------------------------------------------- Biometry  BPD:        70  mm     G. Age:  28w 1d         76  %    CI:        80.99   %    70 - 86                                                          FL/HC:      20.6   %    18.6 - 20.4  HC:      245.6  mm     G. Age:  26w 5d         14  %    HC/AC:      1.08        1.05 - 1.21  AC:      227.9  mm     G. Age:  27w 1d         47  %    FL/BPD:     72.4   %    71 - 87  FL:       50.7  mm     G. Age:  27w 1d         41  %    FL/AC:      22.2   %    20 - 24  CER:      32.2  mm     G. Age:  27w 4d         85  %  LV:        3.6  mm  CM:          8  mm  Est. FW:    1039  gm      2 lb 5 oz     45  % ---------------------------------------------------------------------- OB History  Gravidity:    1 ---------------------------------------------------------------------- Gestational Age  Clinical EDD:  27w 0d                                        EDD:   06/24/20  U/S Today:     27w 2d                                        EDD:   06/22/20  Best:          27w 0d     Det. By:  Clinical EDD             EDD:   06/24/20 ---------------------------------------------------------------------- Anatomy  Cranium:               Appears normal         Aortic Arch:            Not well visualized  Cavum:                 Appears normal         Ductal Arch:            Not well visualized  Ventricles:  Appears normal         Diaphragm:              Appears normal  Choroid Plexus:        Previously seen        Stomach:                Appears  normal, left                                                                        sided  Cerebellum:            Appears normal         Abdomen:                Previously seen  Posterior Fossa:       Previously seen        Abdominal Wall:         Previously seen  Nuchal Fold:           Not applicable (>20    Cord Vessels:           Previously seen                         wks GA)  Face:                  Orbits and profile     Kidneys:                Appear normal                         previously seen  Lips:                  Previously seen        Bladder:                Appears normal  Thoracic:              Previously seen        Spine:                  Limited views                                                                        appear normal  Heart:                 Previously seen        Upper Extremities:      Previously seen  RVOT:                  Previously seen        Lower Extremities:      Previously seen  LVOT:                  Previously seen  Other:  Technically difficult due  to maternal habitus and fetal position. ---------------------------------------------------------------------- Cervix Uterus Adnexa  Cervix  Not visualized (advanced GA >24wks)  Uterus  No abnormality visualized.  Right Ovary  Within normal limits. No adnexal mass visualized.  Left Ovary  Within normal limits. No adnexal mass visualized.  Cul De Sac  No free fluid seen.  Adnexa  No abnormality visualized. ---------------------------------------------------------------------- Impression  Maternal obesity.  Patient returned for fetal growth  assessment and completion of fetal anatomy.  Amniotic fluid is normal and good fetal activity is seen .Fetal  growth is appropriate for gestational age .  Fetal spine  appears normal.  Fetal anatomical survey could still not be  completed because of maternal body habitus and fetal  position.  Maternal obesity imposes limitations on the resolution of  images, and failure to detect fetal  anomalies is more common  in obese pregnant women. As maternal obesity makes  clinical assessment of fetal growth difficult, we recommend  serial growth scans until delivery. ---------------------------------------------------------------------- Recommendations  -An appointment was made for her to return in 4 weeks for  fetal growth assessment. ----------------------------------------------------------------------                  Noralee Spaceavi Shankar, MD Electronically Signed Final Report   03/25/2020 04:31 pm ----------------------------------------------------------------------   Assessment and Plan:  Pregnancy: G1P0000 at 6981w6d  1. Supervision of normal first pregnancy, antepartum  2. Backache symptom Rx: - Elastic Bandages & Supports (COMFORT FIT MATERNITY SUPP SM) MISC; Wear as directed.  Dispense: 1 each; Refill: 0  3. Obesity during pregnancy, antepartum  4. Elevated BP without diagnosis of hypertension - follow BP closely   Preterm labor symptoms and general obstetric precautions including but not limited to vaginal bleeding, contractions, leaking of fluid and fetal movement were reviewed in detail with the patient. I discussed the assessment and treatment plan with the patient. The patient was provided an opportunity to ask questions and all were answered. The patient agreed with the plan and demonstrated an understanding of the instructions. The patient was advised to call back or seek an in-person office evaluation/[facility] for any urgent or concerning symptoms. Please refer to After Visit Summary for other counseling recommendations.   I provided 10 minutes of face-to-face time during this encounter.  Return in about 2 weeks (around 04/28/2020) for ROB.  Future Appointments  Date Time Provider Department Center  [facility]2020 [facility]  04/23/2020 12:45 PM [facility] US5 [facility]    Coral Ceoharles Mana Haberl, MD [facility]  Brock BadHarper, Wessley Emert A, MD 04/14/2020 5:07 PM

## 2020-04-14 NOTE — Progress Notes (Signed)
S/w pt for virtual visit. Pt reports fetal movement, complains of occasional pressure and back pain. Pt states she has had a hard time finding a maternity belt because she cannot find one that will fit.

## 2020-04-23 ENCOUNTER — Other Ambulatory Visit: Payer: Self-pay | Admitting: *Deleted

## 2020-04-23 ENCOUNTER — Ambulatory Visit: Payer: Medicaid Other | Admitting: *Deleted

## 2020-04-23 ENCOUNTER — Other Ambulatory Visit: Payer: Self-pay

## 2020-04-23 ENCOUNTER — Encounter: Payer: Self-pay | Admitting: *Deleted

## 2020-04-23 ENCOUNTER — Ambulatory Visit: Payer: Medicaid Other | Attending: Obstetrics and Gynecology

## 2020-04-23 DIAGNOSIS — Z3400 Encounter for supervision of normal first pregnancy, unspecified trimester: Secondary | ICD-10-CM

## 2020-04-23 DIAGNOSIS — O99210 Obesity complicating pregnancy, unspecified trimester: Secondary | ICD-10-CM | POA: Diagnosis present

## 2020-04-23 DIAGNOSIS — O99213 Obesity complicating pregnancy, third trimester: Secondary | ICD-10-CM | POA: Diagnosis not present

## 2020-04-23 DIAGNOSIS — Z362 Encounter for other antenatal screening follow-up: Secondary | ICD-10-CM

## 2020-04-23 DIAGNOSIS — R638 Other symptoms and signs concerning food and fluid intake: Secondary | ICD-10-CM

## 2020-04-23 DIAGNOSIS — Z3A31 31 weeks gestation of pregnancy: Secondary | ICD-10-CM | POA: Diagnosis not present

## 2020-04-28 ENCOUNTER — Ambulatory Visit (INDEPENDENT_AMBULATORY_CARE_PROVIDER_SITE_OTHER): Payer: Medicaid Other | Admitting: Medical

## 2020-04-28 ENCOUNTER — Other Ambulatory Visit: Payer: Self-pay

## 2020-04-28 VITALS — BP 132/83 | HR 105 | Wt 322.4 lb

## 2020-04-28 DIAGNOSIS — Z3A32 32 weeks gestation of pregnancy: Secondary | ICD-10-CM

## 2020-04-28 DIAGNOSIS — Z23 Encounter for immunization: Secondary | ICD-10-CM

## 2020-04-28 DIAGNOSIS — Z3400 Encounter for supervision of normal first pregnancy, unspecified trimester: Secondary | ICD-10-CM | POA: Diagnosis not present

## 2020-04-28 DIAGNOSIS — O99210 Obesity complicating pregnancy, unspecified trimester: Secondary | ICD-10-CM

## 2020-04-28 NOTE — Patient Instructions (Signed)
Fetal Movement Counts Patient Name: ________________________________________________ Patient Due Date: ____________________ What is a fetal movement count?  A fetal movement count is the number of times that you feel your baby move during a certain amount of time. This may also be called a fetal kick count. A fetal movement count is recommended for every pregnant woman. You may be asked to start counting fetal movements as early as week 28 of your pregnancy. Pay attention to when your baby is most active. You may notice your baby's sleep and wake cycles. You may also notice things that make your baby move more. You should do a fetal movement count:  When your baby is normally most active.  At the same time each day. A good time to count movements is while you are resting, after having something to eat and drink. How do I count fetal movements? 1. Find a quiet, comfortable area. Sit, or lie down on your side. 2. Write down the date, the start time and stop time, and the number of movements that you felt between those two times. Take this information with you to your health care visits. 3. Write down your start time when you feel the first movement. 4. Count kicks, flutters, swishes, rolls, and jabs. You should feel at least 10 movements. 5. You may stop counting after you have felt 10 movements, or if you have been counting for 2 hours. Write down the stop time. 6. If you do not feel 10 movements in 2 hours, contact your health care provider for further instructions. Your health care provider may want to do additional tests to assess your baby's well-being. Contact a health care provider if:  You feel fewer than 10 movements in 2 hours.  Your baby is not moving like he or she usually does. Date: ____________ Start time: ____________ Stop time: ____________ Movements: ____________ Date: ____________ Start time: ____________ Stop time: ____________ Movements: ____________ Date: ____________  Start time: ____________ Stop time: ____________ Movements: ____________ Date: ____________ Start time: ____________ Stop time: ____________ Movements: ____________ Date: ____________ Start time: ____________ Stop time: ____________ Movements: ____________ Date: ____________ Start time: ____________ Stop time: ____________ Movements: ____________ Date: ____________ Start time: ____________ Stop time: ____________ Movements: ____________ Date: ____________ Start time: ____________ Stop time: ____________ Movements: ____________ Date: ____________ Start time: ____________ Stop time: ____________ Movements: ____________ This information is not intended to replace advice given to you by your health care provider. Make sure you discuss any questions you have with your health care provider. Document Revised: 02/07/2019 Document Reviewed: 02/07/2019 Elsevier Patient Education  2020 Else[employer]. Braxton Hicks Contractions Contractions of the uterus can occur throughout pregnancy, but they are not always a sign that you are in labor. You may have practice contractions called Braxton Hicks contractions. These false labor contractions are sometimes confused with true labor. What are Braxton Hicks contractions? Braxton Hicks contractions are tightening movements that occur in the muscles of the uterus before labor. Unlike true labor contractions, these contractions do not result in opening (dilation) and thinning of the cervix. Toward the end of pregnancy (32-34 weeks), Braxton Hicks contractions can happen more often and may become stronger. These contractions are sometimes difficult to tell apart from true labor because they can be very uncomfortable. You should not feel embarrassed if you go to the hospital with false labor. Sometimes, the only way to tell if you are in true labor is for your health care provider to look for changes in the cervix. The health care provider   will do a physical exam and may  monitor your contractions. If you are not in true labor, the exam should show that your cervix is not dilating and your water has not broken. If there are no other health problems associated with your pregnancy, it is completely safe for you to be sent home with false labor. You may continue to have Braxton Hicks contractions until you go into true labor. How to tell the difference between true labor and false labor True labor  Contractions last 30-70 seconds.  Contractions become very regular.  Discomfort is usually felt in the top of the uterus, and it spreads to the lower abdomen and low back.  Contractions do not go away with walking.  Contractions usually become more intense and increase in frequency.  The cervix dilates and gets thinner. False labor  Contractions are usually shorter and not as strong as true labor contractions.  Contractions are usually irregular.  Contractions are often felt in the front of the lower abdomen and in the groin.  Contractions may go away when you walk around or change positions while lying down.  Contractions get weaker and are shorter-lasting as time goes on.  The cervix usually does not dilate or become thin. Follow these instructions at home:   Take over-the-counter and prescription medicines only as told by your health care provider.  Keep up with your usual exercises and follow other instructions from your health care provider.  Eat and drink lightly if you think you are going into labor.  If Braxton Hicks contractions are making you uncomfortable: ? Change your position from lying down or resting to walking, or change from walking to resting. ? Sit and rest in a tub of warm water. ? Drink enough fluid to keep your urine pale yellow. Dehydration may cause these contractions. ? Do slow and deep breathing several times an hour.  Keep all follow-up prenatal visits as told by your health care provider. This is important. Contact a  health care provider if:  You have a fever.  You have continuous pain in your abdomen. Get help right away if:  Your contractions become stronger, more regular, and closer together.  You have fluid leaking or gushing from your vagina.  You pass blood-tinged mucus (bloody show).  You have bleeding from your vagina.  You have low back pain that you never had before.  You feel your baby's head pushing down and causing pelvic pressure.  Your baby is not moving inside you as much as it used to. Summary  Contractions that occur before labor are called Braxton Hicks contractions, false labor, or practice contractions.  Braxton Hicks contractions are usually shorter, weaker, farther apart, and less regular than true labor contractions. True labor contractions usually become progressively stronger and regular, and they become more frequent.  Manage discomfort from Braxton Hicks contractions by changing position, resting in a warm bath, drinking plenty of water, or practicing deep breathing. This information is not intended to replace advice given to you by your health care provider. Make sure you discuss any questions you have with your health care provider. Document Revised: 06/02/2017 Document Reviewed: 11/03/2016 Elsevier Patient Education  2020 Elsevier Inc.  

## 2020-04-28 NOTE — Progress Notes (Signed)
Pt is here for ROB, [redacted]w[redacted]d.

## 2020-04-30 NOTE — Progress Notes (Signed)
   PRENATAL VISIT NOTE  Subjective:  Carmen Price is a 29 y.o. G1P0000 at [redacted]w[redacted]d being seen today for ongoing prenatal care.  She is currently monitored for the following issues for this low-risk pregnancy and has Abnormal uterine bleeding (AUB); ACNE VULGARIS, FACIAL; Supervision of normal first pregnancy, antepartum; Obesity during pregnancy, antepartum; and Trichomonal vaginitis during pregnancy in first trimester on their problem list.  Patient reports no complaints.  Contractions: Not present. Vag. Bleeding: None.  Movement: Present. Denies leaking of fluid.   The following portions of the patient's history were reviewed and updated as appropriate: allergies, current medications, past family history, past medical history, past social history, past surgical history and problem list.   Objective:   Vitals:   04/28/20 1544  BP: 132/83  Pulse: (!) 105  Weight: (!) 322 lb 6.4 oz (146.2 kg)    Fetal Status: Fetal Heart Rate (bpm): 135   Movement: Present     General:  Alert, oriented and cooperative. Patient is in no acute distress.  Skin: Skin is warm and dry. No rash noted.   Cardiovascular: Normal heart rate noted  Respiratory: Normal respiratory effort, no problems with respiration noted  Abdomen: Soft, gravid, appropriate for gestational age.  Pain/Pressure: Absent     Pelvic: Cervical exam deferred        Extremities: Normal range of motion.  Edema: Trace  Mental Status: Normal mood and affect. Normal behavior. Normal judgment and thought content.   Assessment and Plan:  Pregnancy: G1P0000 at 102w1d 1. Supervision of normal first pregnancy, antepartum - Tdap vaccine greater than or equal to 7yo IM  2. Obesity during pregnancy, antepartum - BASA - Growth [place] scheduled 11/18  3. [redacted] weeks gestation of pregnancy  Preterm labor symptoms and general obstetric precautions including but not limited to vaginal bleeding, contractions, leaking of fluid and fetal movement were  reviewed in detail with the patient. Please refer to After Visit Summary for other counseling recommendations.   Return in about 2 weeks (around 05/12/2020) for LOB, Virtual.  Future Appointments  Date Time Provider Department Center  05/12/2020  3:15 PM [name], MD [facility] None  05/21/2020  2:30 PM [facility] NURSE [facility]  05/21/2020  2:45 PM [facility] US5 [facility]    Vonzella Nipple, PA-C

## 2020-05-12 ENCOUNTER — Telehealth (INDEPENDENT_AMBULATORY_CARE_PROVIDER_SITE_OTHER): Payer: Medicaid Other | Admitting: Obstetrics and Gynecology

## 2020-05-12 ENCOUNTER — Encounter: Payer: Self-pay | Admitting: Obstetrics and Gynecology

## 2020-05-12 ENCOUNTER — Encounter (HOSPITAL_COMMUNITY): Payer: Self-pay | Admitting: Obstetrics and Gynecology

## 2020-05-12 ENCOUNTER — Other Ambulatory Visit: Payer: Self-pay

## 2020-05-12 ENCOUNTER — Inpatient Hospital Stay (HOSPITAL_COMMUNITY)
Admission: AD | Admit: 2020-05-12 | Discharge: 2020-05-12 | Disposition: A | Discharge status: Home / Self Care | Payer: Medicaid Other | Attending: Obstetrics and Gynecology | Admitting: Obstetrics and Gynecology

## 2020-05-12 VITALS — BP 138/87 | HR 108 | Ht 67.0 in

## 2020-05-12 DIAGNOSIS — O26893 Other specified pregnancy related conditions, third trimester: Secondary | ICD-10-CM | POA: Insufficient documentation

## 2020-05-12 DIAGNOSIS — O99213 Obesity complicating pregnancy, third trimester: Secondary | ICD-10-CM

## 2020-05-12 DIAGNOSIS — Z7982 Long term (current) use of aspirin: Secondary | ICD-10-CM | POA: Diagnosis not present

## 2020-05-12 DIAGNOSIS — Z87891 Personal history of nicotine dependence: Secondary | ICD-10-CM | POA: Insufficient documentation

## 2020-05-12 DIAGNOSIS — Z3400 Encounter for supervision of normal first pregnancy, unspecified trimester: Secondary | ICD-10-CM

## 2020-05-12 DIAGNOSIS — R030 Elevated blood-pressure reading, without diagnosis of hypertension: Secondary | ICD-10-CM | POA: Diagnosis present

## 2020-05-12 DIAGNOSIS — G8929 Other chronic pain: Secondary | ICD-10-CM | POA: Insufficient documentation

## 2020-05-12 DIAGNOSIS — E669 Obesity, unspecified: Secondary | ICD-10-CM

## 2020-05-12 DIAGNOSIS — O99210 Obesity complicating pregnancy, unspecified trimester: Secondary | ICD-10-CM

## 2020-05-12 DIAGNOSIS — R519 Headache, unspecified: Secondary | ICD-10-CM

## 2020-05-12 DIAGNOSIS — Z3689 Encounter for other specified antenatal screening: Secondary | ICD-10-CM

## 2020-05-12 DIAGNOSIS — O133 Gestational [pregnancy-induced] hypertension without significant proteinuria, third trimester: Secondary | ICD-10-CM | POA: Diagnosis not present

## 2020-05-12 DIAGNOSIS — Z3A33 33 weeks gestation of pregnancy: Secondary | ICD-10-CM

## 2020-05-12 DIAGNOSIS — O1410 Severe pre-eclampsia, unspecified trimester: Secondary | ICD-10-CM | POA: Insufficient documentation

## 2020-05-12 LAB — CBC WITH DIFFERENTIAL/PLATELET
Abs Immature Granulocytes: 0.01 10*3/uL (ref 0.00–0.07)
Basophils Absolute: 0 10*3/uL (ref 0.0–0.1)
Basophils Relative: 0 %
Eosinophils Absolute: 0 10*3/uL (ref 0.0–0.5)
Eosinophils Relative: 0 %
HCT: 32.2 % — ABNORMAL LOW (ref 36.0–46.0)
Hemoglobin: 10.6 g/dL — ABNORMAL LOW (ref 12.0–15.0)
Immature Granulocytes: 0 %
Lymphocytes Relative: 22 %
Lymphs Abs: 1.2 10*3/uL (ref 0.7–4.0)
MCH: 31.2 pg (ref 26.0–34.0)
MCHC: 32.9 g/dL (ref 30.0–36.0)
MCV: 94.7 fL (ref 80.0–100.0)
Monocytes Absolute: 0.5 10*3/uL (ref 0.1–1.0)
Monocytes Relative: 9 %
Neutro Abs: 3.8 10*3/uL (ref 1.7–7.7)
Neutrophils Relative %: 69 %
Platelets: 227 10*3/uL (ref 150–400)
RBC: 3.4 MIL/uL — ABNORMAL LOW (ref 3.87–5.11)
RDW: 13.2 % (ref 11.5–15.5)
WBC: 5.4 10*3/uL (ref 4.0–10.5)
nRBC: 0 % (ref 0.0–0.2)

## 2020-05-12 LAB — URINALYSIS, ROUTINE W REFLEX MICROSCOPIC
Bilirubin Urine: NEGATIVE
Glucose, UA: NEGATIVE mg/dL
Hgb urine dipstick: NEGATIVE
Ketones, ur: NEGATIVE mg/dL
Nitrite: NEGATIVE
Protein, ur: 30 mg/dL — AB
Specific Gravity, Urine: 1.02 (ref 1.005–1.030)
pH: 7 (ref 5.0–8.0)

## 2020-05-12 LAB — COMPREHENSIVE METABOLIC PANEL
ALT: 12 U/L (ref 0–44)
AST: 14 U/L — ABNORMAL LOW (ref 15–41)
Albumin: 2.6 g/dL — ABNORMAL LOW (ref 3.5–5.0)
Alkaline Phosphatase: 43 U/L (ref 38–126)
Anion gap: 8 (ref 5–15)
BUN: 5 mg/dL — ABNORMAL LOW (ref 6–20)
CO2: 22 mmol/L (ref 22–32)
Calcium: 8.8 mg/dL — ABNORMAL LOW (ref 8.9–10.3)
Chloride: 108 mmol/L (ref 98–111)
Creatinine, Ser: 0.42 mg/dL — ABNORMAL LOW (ref 0.44–1.00)
GFR, Estimated: >60 mL/min (ref >60)
Glucose, Bld: 123 mg/dL — ABNORMAL HIGH (ref 70–99)
Potassium: 3.3 mmol/L — ABNORMAL LOW (ref 3.5–5.1)
Sodium: 138 mmol/L (ref 135–145)
Total Bilirubin: 0.5 mg/dL (ref 0.3–1.2)
Total Protein: 6.2 g/dL — ABNORMAL LOW (ref 6.5–8.1)

## 2020-05-12 LAB — PROTEIN / CREATININE RATIO, URINE
Creatinine, Urine: 181.11 mg/dL
Protein Creatinine Ratio: 0.18 mg/mg{Cre} — ABNORMAL HIGH (ref 0.00–0.15)
Total Protein, Urine: 32 mg/dL

## 2020-05-12 NOTE — MAU Provider Note (Addendum)
[facility] Provider Note   CSN: [id number] Arrival date & time: 05/12/20  1456     History Chief Complaint  Patient presents with  . Hypertension    Carmen Price is a 29 y.o. female who is here in the MAU for evaluation of elevated blood pressure and headaches.  Today in the office she had her second readings of elevated blood pressures and states that she has a history of chronic migraines.  However, she also states that she has been having new, sharp, 7/10 frontal headaches that last for 1 to 5 minutes but are easily relieved with rest, hydration, Tylenol. she denies abdominal pain, SOB, CP, visual changes.      Past Medical History:  Diagnosis Date  . Anemia   . Anxiety   . Blood transfusion during current hospitalization 02/03/2015  . Blood transfusion without reported diagnosis   . Depression   . GERD (gastroesophageal reflux disease)   . Headache   . Symptomatic anemia 02/03/2015  . Vaginal Pap smear, abnormal     Patient Active Problem List   Diagnosis Date Noted  . Gestational hypertension, third trimester 05/12/2020  . Pregnancy headache in third trimester 05/12/2020  . Obesity during pregnancy, antepartum 11/28/2019  . Trichomonal vaginitis during pregnancy in first trimester 11/28/2019  . Supervision of normal first pregnancy, antepartum 11/21/2019  . ACNE VULGARIS, FACIAL 03/13/2007    Past Surgical History:  Procedure Laterality Date  . TONSILLECTOMY       OB History    Gravida  1   Para  0   Term  0   Preterm  0   AB  0   Living  0     SAB  0   TAB  0   Ectopic  0   Multiple  0   Live Births              Family History  Problem Relation Age of Onset  . Hypertension Mother   . Arthritis Mother   . Depression Mother   . Diabetes Father   . Diabetes Maternal Grandmother   . Cancer Maternal Grandmother   . Hypertension Maternal Grandmother   . Kidney disease Maternal Grandmother   . Asthma Brother     . ADD / ADHD Maternal Uncle   . Diabetes Maternal Uncle   . Hypertension Maternal Uncle     Social History   Tobacco Use  . Smoking status: Former Smoker    Packs/day: 0.25    Years: 4.00    Pack years: 1.00    Types: Cigarettes  . Smokeless tobacco: Never Used  Vaping Use  . Vaping Use: Never used  Substance Use Topics  . Alcohol use: Not Currently    Alcohol/week: 0.0 standard drinks    Comment: Social  . Drug use: No    Home Medications Prior to Admission medications   Medication Sig Start Date End Date Taking? Authorizing Provider  aspirin EC 81 MG tablet Take 1 tablet (81 mg total) by mouth daily. Take after 12 weeks for prevention of preeclampsia later in pregnancy 11/28/19  Yes Aundria Rudogers, Rudean CurtVeronica C, CNM  Prenat-Fe Poly-Methfol-FA-DHA (VITAFOL ULTRA) 29-0.6-0.4-200 MG CAPS Take 1 tablet by mouth daily. 01/02/20  Yes Fair, Hoyle Sauerhelsea N, MD  Elastic Bandages & Supports (COMFORT FIT MATERNITY SUPP SM) MISC Wear as directed. Patient not taking: Reported on 05/12/2020 04/14/20   Brock BadHarper, Charles A, MD  metoCLOPramide (REGLAN) 10 MG tablet Take 1 tablet (10  mg total) by mouth every 6 (six) hours. Patient not taking: Reported on 01/29/2020 10/24/19   Marylene Land, CNM  promethazine (PHENERGAN) 25 MG tablet Take 1 tablet (25 mg total) by mouth every 6 (six) hours as needed for nausea. Patient not taking: Reported on 11/21/2019 03/29/19   Ward, Chase Picket, PA-C    Allergies    Patient has no known allergies.  Review of Systems   Review of Systems  Physical Exam Updated Vital Signs  Today's Vitals   05/12/20 1700 05/12/20 1715 05/12/20 1730 05/12/20 1735  BP: (!) 144/83 140/76 (!) 142/80   Pulse: 99 98 97   Resp:    18  Temp:      TempSrc:      SpO2: 97% 95% 96% 99%  Weight:       Body mass index is 51.01 kg/m.   Physical Exam Constitutional:      General: She is not in acute distress.    Appearance: She is obese.  HENT:     Head: Normocephalic and  atraumatic.  Cardiovascular:     Rate and Rhythm: Normal rate and regular rhythm.  Pulmonary:     Effort: Pulmonary effort is normal. No respiratory distress.  Abdominal:     General: There is no distension.     Palpations: Abdomen is soft.     Tenderness: There is no abdominal tenderness.  Musculoskeletal:        General: Swelling (1+ pitting edema bilat.) present.  Neurological:     General: No focal deficit present.     Mental Status: She is alert.   EFM: 125/ moderate variability/ accels present/ no decels Toco: no contractions   ED Results / Procedures / Treatments   Labs (all labs ordered are listed, but only abnormal results are displayed) Labs Reviewed  URINALYSIS, ROUTINE W REFLEX MICROSCOPIC - Abnormal; Notable for the following components:      Result Value   Color, Urine AMBER (*)    APPearance CLOUDY (*)    Protein, ur 30 (*)    Leukocytes,Ua SMALL (*)    Bacteria, UA MANY (*)    All other components within normal limits  CBC WITH DIFFERENTIAL/PLATELET - Abnormal; Notable for the following components:   RBC 3.40 (*)    Hemoglobin 10.6 (*)    HCT 32.2 (*)    All other components within normal limits  COMPREHENSIVE METABOLIC PANEL - Abnormal; Notable for the following components:   Potassium 3.3 (*)    Glucose, Bld 123 (*)    BUN 5 (*)    Creatinine, Ser 0.42 (*)    Calcium 8.8 (*)    Total Protein 6.2 (*)    Albumin 2.6 (*)    AST 14 (*)    All other components within normal limits  PROTEIN / CREATININE RATIO, URINE - Abnormal; Notable for the following components:   Protein Creatinine Ratio 0.18 (*)    All other components within normal limits  CULTURE, OB URINE   EKG    Radiology No results found.  Medications Ordered in ED Medications - No data to display  ED Course  I have reviewed the triage vital signs and the nursing notes.  Pertinent labs & imaging results that were available during my care of the patient were reviewed by me and  considered in my medical decision making (see chart for details).    MDM Rules/Calculators/A&P  Carmen Price is a 29 yo female here for rule out of pre-eclampsia.  She meets criteria for gHTN today (also had 1 elevation last month). Her blood pressures have remained elevated but nonsevere and have not required any antihypertensives.  She has nonsevere symptoms with headaches that are easily relieved. No headaches while here.  Labs normal, CMP and a UPC of 0.18, which is unchanged from baseline earlier in pregnancy. NST reactive.  Recommend continuing to take Tylenol for headache, counseled on preeclampsia precautions, follow-up closely with [facility] for continued surveillance of blood pressures and twice weekly antenatal testing and recommendation to induce labor by 37 weeks.  Final Clinical Impression(s) / ED Diagnoses Final diagnoses:  Gestational hypertension, third trimester  Chronic nonintractable headache, unspecified headache type  NST (non-stress test) reactive   Discharge Patient Continue daily baby aspirin Tylenol for HA prn Follow up with [facility] in 2-3 days for BP check  Rx / DC Orders ED Discharge Orders         Ordered    Discharge patient        05/12/20 1729         Kathrin Greathouse, MD 05/12/20 5:57pm  CNM attestation:  HPI: Carmen Price is a 29 y.o. G1P0000 at [redacted]w[redacted]d presenting with HA and elevated BP  ROS, labs, PMH reviewed + FM, no LOF no VB no ctx  PE: Patient Vitals for the past 24 hrs:  BP Temp Temp src Pulse Resp SpO2 Weight  05/12/20 1735 -- -- -- -- 18 99 % --  05/12/20 1730 (!) 142/80 -- -- 97 -- 96 % --  05/12/20 1715 140/76 -- -- 98 -- 95 % --  05/12/20 1700 (!) 144/83 -- -- 99 -- 97 % --  05/12/20 1645 134/85 -- -- (!) 105 -- 96 % --  05/12/20 1630 (!) 142/73 -- -- (!) 103 -- 98 % --  05/12/20 1615 (!) 142/79 -- -- (!) 107 -- 97 % --  05/12/20 1600 140/77 -- -- (!) 103 -- 99 % --  05/12/20 1545 (!) 144/79 -- -- 100 -- 98  % --  05/12/20 1540 -- -- -- -- -- 99 % --  05/12/20 1535 -- -- -- -- -- 97 % --  05/12/20 1530 (!) 141/85 -- -- (!) 109 -- 97 % --  05/12/20 1522 139/75 98.2 F (36.8 C) Oral (!) 111 20 98 % --  05/12/20 1514 -- -- -- -- -- -- (!) 147.7 kg   Gen: A&O x3, NAD Resp: normal effort and rate, no distress Heart: regular rate Abd: Soft, NT Pelvic: deferred  EFM:  FHR 130, mod variability, + accels, no decels TOCO: none  Orders Placed This Encounter  Procedures  . OB Urine Culture  . Urinalysis, Routine w reflex microscopic Urine, Clean Catch  . CBC with Differential/Platelet  . Comprehensive metabolic panel  . Protein / creatinine ratio, urine  . Discharge patient   No orders of the defined types were placed in this encounter.  MDM Labs ordered and reviewed. No HA during MAU encounter. PEC labs normal. No severe BPs or features. Will arrange BP check and twice weekly ante testing, plan for IOL at 37 wks. Stable for discharge home.   Assessment: 1. Pregnancy headache in third trimester   2. Obesity during pregnancy, antepartum   3. Supervision of normal first pregnancy, antepartum   4. Gestational hypertension, third trimester   5. Chronic nonintractable headache, unspecified headache type   6. NST (non-stress test) reactive  Plan: - Discharge home  - PEC precautions - Follow-up in 2 days for BP check  - Return to maternity admissions if symptoms worsen  I confirm that I have verified the information documented in the resident's note. I personally was present during the history, physical exam, and medical decision-making activities of this service and have verified that the service and findings are accurately documented in the resident's note.  Donette Larry, CNM 05/12/2020 5:57 PM

## 2020-05-12 NOTE — Progress Notes (Signed)
OBSTETRICS PRENATAL VIRTUAL VISIT ENCOUNTER NOTE  Provider location: Center for [facility] [place]   I connected with Carmen Price on 05/12/20 at  3:15 PM EST by MyChart Video Encounter at home and verified that I am speaking with the correct person using two identifiers.   I discussed the limitations, risks, security and privacy concerns of performing an evaluation and management service virtually and the availability of in person appointments. I also discussed with the patient that there may be a patient responsible charge related to this service. The patient expressed understanding and agreed to proceed. Subjective:  Carmen Price is a 29 y.o. G1P0000 at [redacted]w[redacted]d being seen today for ongoing prenatal care.  She is currently monitored for the following issues for this high-risk pregnancy and has ACNE VULGARIS, FACIAL; Supervision of normal first pregnancy, antepartum; Obesity during pregnancy, antepartum; Trichomonal vaginitis during pregnancy in first trimester; Gestational hypertension, third trimester; and Pregnancy headache in third trimester on their problem list.  Patient reports Headaches, unrelieved with tylenol.  Patient denies any visual changes, CP, SOB, or RUQ abdominal pain.  .  Contractions: Not present. Vag. Bleeding: None.  Movement: Present. Denies any leaking of fluid.   The following portions of the patient's history were reviewed and updated as appropriate: allergies, current medications, past family history, past medical history, past social history, past surgical history and problem list.   Objective:   Vitals:   05/12/20 1337 05/12/20 1340  BP: (!) 147/81 138/87  Pulse: (!) 108   Height: 5\' 7"  (1.702 m)     Fetal Status:     Movement: Present     General:  Alert, oriented and cooperative. Patient is in no acute distress.  Respiratory: Normal respiratory effort, no problems with respiration noted  Mental Status: Normal mood and affect. Normal  behavior. Normal judgment and thought content.  Rest of physical exam deferred due to type of encounter    Assessment and Plan:  Pregnancy: G1P0000 at [redacted]w[redacted]d 1. Obesity during pregnancy, antepartum - Patient to continue ASA. - Growth scan scheduled for 05/21/20.  2. Supervision of normal first pregnancy, antepartum   3. Gestational hypertension, third trimester - New Diagnosis. - PreEclampsia precautions given. - Patient to start weekly antenatal testing. - Anticipate delivery at 37 weeks unless she develops severe features. - All future visits should be in person.    4. Pregnancy headache in third trimester - With new diagnosis of gestational hypertension and no relief with tylenol, will send to [facility] triage now for further workup.    Preterm labor symptoms and general obstetric precautions including but not limited to vaginal bleeding, contractions, leaking of fluid and fetal movement were reviewed in detail with the patient. I discussed the assessment and treatment plan with the patient. The patient was provided an opportunity to ask questions and all were answered. The patient agreed with the plan and demonstrated an understanding of the instructions. The patient was advised to call back or seek an in-person office evaluation/go to MAU at [facility] for any urgent or concerning symptoms. Please refer to After Visit Summary for other counseling recommendations.   I provided 20 minutes of face-to-face time during this encounter.  Return in about 1 week (around 05/19/2020) for HROB with NST.  Future Appointments  Date Time Provider Department Center  05/12/2020  3:15 PM 13/03/2020, MD [facility] None  05/21/2020  2:30 PM [facility] NURSE [facility]  05/21/2020  2:45 PM [facility] US5 [facility]    [name], MD Center for [facility], [facility]

## 2020-05-12 NOTE — MAU Note (Signed)
Patient had virtual visit today and her BP was elevated.  148/81, then 138/80, per pt. She also had a headache at the time that was unrelieved by Tylenol, but the headache has since gone away.  No c/o pain at this time.  Reports good fetal movement.  Denies visual changes, epigastric pain, or sudden increase in swelling.  No LOF or vaginal bleeding, no CTX.

## 2020-05-12 NOTE — Discharge Instructions (Signed)

## 2020-05-12 NOTE — Progress Notes (Signed)
I connected with Carmen Price on 05/12/20 at  3:15 PM EST by telephone and verified that I am speaking with the correct person using two identifiers.   Pt c/o low back pain Pt c/o intermittent HA's but after subsides after eating Pt requests to start maternity leave Dec 6th.

## 2020-05-14 LAB — CULTURE, OB URINE: Culture: 5000 — AB

## 2020-05-15 ENCOUNTER — Encounter: Payer: Self-pay | Admitting: Obstetrics and Gynecology

## 2020-05-15 DIAGNOSIS — R8271 Bacteriuria: Secondary | ICD-10-CM | POA: Insufficient documentation

## 2020-05-15 LAB — OB RESULTS CONSOLE GBS
GBS: POSITIVE
GBS: POSITIVE

## 2020-05-20 ENCOUNTER — Encounter: Payer: Self-pay | Admitting: Obstetrics and Gynecology

## 2020-05-20 ENCOUNTER — Other Ambulatory Visit: Payer: Self-pay

## 2020-05-20 ENCOUNTER — Ambulatory Visit (INDEPENDENT_AMBULATORY_CARE_PROVIDER_SITE_OTHER): Payer: Medicaid Other | Admitting: Obstetrics and Gynecology

## 2020-05-20 VITALS — BP 126/82 | HR 106 | Wt 328.0 lb

## 2020-05-20 DIAGNOSIS — O133 Gestational [pregnancy-induced] hypertension without significant proteinuria, third trimester: Secondary | ICD-10-CM

## 2020-05-20 DIAGNOSIS — O99210 Obesity complicating pregnancy, unspecified trimester: Secondary | ICD-10-CM

## 2020-05-20 DIAGNOSIS — Z3400 Encounter for supervision of normal first pregnancy, unspecified trimester: Secondary | ICD-10-CM

## 2020-05-20 NOTE — Progress Notes (Signed)
OB/NST gHTN

## 2020-05-20 NOTE — Progress Notes (Signed)
   PRENATAL VISIT NOTE  Subjective:  Carmen Price is a 29 y.o. G1P0000 at [redacted]w[redacted]d being seen today for ongoing prenatal care.  She is currently monitored for the following issues for this high-risk pregnancy and has ACNE VULGARIS, FACIAL; Supervision of normal first pregnancy, antepartum; Obesity during pregnancy, antepartum; Trichomonal vaginitis during pregnancy in first trimester; Gestational hypertension, third trimester; Pregnancy headache in third trimester; and GBS bacteriuria on their problem list.  Patient reports no complaints.  Contractions: Not present. Vag. Bleeding: None.  Movement: Present. Denies leaking of fluid.   The following portions of the patient's history were reviewed and updated as appropriate: allergies, current medications, past family history, past medical history, past social history, past surgical history and problem list.   Objective:   Vitals:   05/20/20 1324  BP: 126/82  Pulse: (!) 106  Weight: (!) 328 lb (148.8 kg)    Fetal Status: Fetal Heart Rate (bpm): 140   Movement: Present     General:  Alert, oriented and cooperative. Patient is in no acute distress.  Skin: Skin is warm and dry. No rash noted.   Cardiovascular: Normal heart rate noted  Respiratory: Normal respiratory effort, no problems with respiration noted  Abdomen: Soft, gravid, appropriate for gestational age.  Pain/Pressure: Absent     Pelvic: Cervical exam deferred        Extremities: Normal range of motion.  Edema: Trace  Mental Status: Normal mood and affect. Normal behavior. Normal judgment and thought content.   Assessment and Plan:  Pregnancy: G1P0000 at [redacted]w[redacted]d  1. Gestational hypertension, third trimester IOL at 37 weeks - Fetal nonstress test NST reactive today Add BPP to f/u tomorrow  2. Supervision of normal first pregnancy, antepartum  3. Obesity during pregnancy, antepartum Cont baby asa  Preterm labor symptoms and general obstetric precautions including but not  limited to vaginal bleeding, contractions, leaking of fluid and fetal movement were reviewed in detail with the patient. Please refer to After Visit Summary for other counseling recommendations.   Return in about 1 week (around 05/27/2020) for in person, high OB, 36 week swabs.  Future Appointments  Date Time Provider Department Center  05/21/2020  2:30 PM [facility] NURSE [facility]  05/21/2020  2:45 PM [facility] US5 [facility]  05/27/2020  2:45 PM Hermina Staggers, MD [facility] None  06/03/2020  8:40 AM [facility] SCHED ROOM [facility] None    Conan Bowens, MD

## 2020-05-21 ENCOUNTER — Ambulatory Visit: Payer: Medicaid Other | Admitting: *Deleted

## 2020-05-21 ENCOUNTER — Encounter: Payer: Self-pay | Admitting: *Deleted

## 2020-05-21 ENCOUNTER — Ambulatory Visit: Payer: Medicaid Other | Attending: Obstetrics and Gynecology

## 2020-05-21 ENCOUNTER — Encounter (HOSPITAL_COMMUNITY): Payer: Self-pay | Admitting: *Deleted

## 2020-05-21 ENCOUNTER — Telehealth (HOSPITAL_COMMUNITY): Payer: Self-pay | Admitting: *Deleted

## 2020-05-21 DIAGNOSIS — O99213 Obesity complicating pregnancy, third trimester: Secondary | ICD-10-CM | POA: Diagnosis not present

## 2020-05-21 DIAGNOSIS — Z3400 Encounter for supervision of normal first pregnancy, unspecified trimester: Secondary | ICD-10-CM

## 2020-05-21 DIAGNOSIS — O99210 Obesity complicating pregnancy, unspecified trimester: Secondary | ICD-10-CM

## 2020-05-21 DIAGNOSIS — R638 Other symptoms and signs concerning food and fluid intake: Secondary | ICD-10-CM | POA: Insufficient documentation

## 2020-05-21 DIAGNOSIS — Z362 Encounter for other antenatal screening follow-up: Secondary | ICD-10-CM | POA: Diagnosis not present

## 2020-05-21 DIAGNOSIS — R8271 Bacteriuria: Secondary | ICD-10-CM

## 2020-05-21 DIAGNOSIS — O133 Gestational [pregnancy-induced] hypertension without significant proteinuria, third trimester: Secondary | ICD-10-CM | POA: Insufficient documentation

## 2020-05-21 DIAGNOSIS — Z3A35 35 weeks gestation of pregnancy: Secondary | ICD-10-CM

## 2020-05-21 NOTE — Telephone Encounter (Signed)
Preadmission screen  

## 2020-05-27 ENCOUNTER — Other Ambulatory Visit (HOSPITAL_COMMUNITY)
Admission: RE | Admit: 2020-05-27 | Discharge: 2020-05-27 | Disposition: A | Discharge status: Home / Self Care | Payer: Medicaid Other | Source: Ambulatory Visit | Attending: Obstetrics and Gynecology | Admitting: Obstetrics and Gynecology

## 2020-05-27 ENCOUNTER — Other Ambulatory Visit: Payer: Self-pay

## 2020-05-27 ENCOUNTER — Ambulatory Visit (INDEPENDENT_AMBULATORY_CARE_PROVIDER_SITE_OTHER): Payer: Medicaid Other | Admitting: Obstetrics and Gynecology

## 2020-05-27 ENCOUNTER — Encounter: Payer: Self-pay | Admitting: Obstetrics and Gynecology

## 2020-05-27 VITALS — BP 137/88 | HR 111 | Wt 330.5 lb

## 2020-05-27 DIAGNOSIS — Z3400 Encounter for supervision of normal first pregnancy, unspecified trimester: Secondary | ICD-10-CM | POA: Diagnosis not present

## 2020-05-27 DIAGNOSIS — Z3A36 36 weeks gestation of pregnancy: Secondary | ICD-10-CM

## 2020-05-27 DIAGNOSIS — Z3480 Encounter for supervision of other normal pregnancy, unspecified trimester: Secondary | ICD-10-CM | POA: Insufficient documentation

## 2020-05-27 DIAGNOSIS — O133 Gestational [pregnancy-induced] hypertension without significant proteinuria, third trimester: Secondary | ICD-10-CM | POA: Diagnosis not present

## 2020-05-27 DIAGNOSIS — R8271 Bacteriuria: Secondary | ICD-10-CM

## 2020-05-27 NOTE — Progress Notes (Signed)
Patient presents for ROB and GBS. Patient has no concerns today. 

## 2020-05-27 NOTE — Progress Notes (Signed)
Subjective:  Carmen Price is a 29 y.o. G1P0000 at [redacted]w[redacted]d being seen today for ongoing prenatal care.  She is currently monitored for the following issues for this high-risk pregnancy and has ACNE VULGARIS, FACIAL; Supervision of normal first pregnancy, antepartum; Obesity during pregnancy, antepartum; Gestational hypertension, third trimester; Pregnancy headache in third trimester; and GBS bacteriuria on their problem list.  Patient reports General discomforts of pregnancy.  Contractions: Not present. Vag. Bleeding: None.  Movement: Present. Denies leaking of fluid.   The following portions of the patient's history were reviewed and updated as appropriate: allergies, current medications, past family history, past medical history, past social history, past surgical history and problem list. Problem list updated.  Objective:   Vitals:   05/27/20 1434  BP: 137/88  Pulse: (!) 111  Weight: (!) 330 lb 8 oz (149.9 kg)    Fetal Status:     Movement: Present     General:  Alert, oriented and cooperative. Patient is in no acute distress.  Skin: Skin is warm and dry. No rash noted.   Cardiovascular: Normal heart rate noted  Respiratory: Normal respiratory effort, no problems with respiration noted  Abdomen: Soft, gravid, appropriate for gestational age. Pain/Pressure: Absent     Pelvic:  Pt declined        Extremities: Normal range of motion.  Edema: None  Mental Status: Normal mood and affect. Normal behavior. Normal judgment and thought content.   Urinalysis:      Assessment and Plan:  Pregnancy: G1P0000 at [redacted]w[redacted]d  1. Supervision of normal first pregnancy, antepartum Stable IOL next week - Cervicovaginal ancillary only( [place])  2. [redacted] weeks gestation of pregnancy  - Cervicovaginal ancillary only( [place])  3. Gestational hypertension, third trimester BP stable Reactive NST today IOL next week  4. GBS bacteriuria Tx while in labor  Term labor symptoms and general  obstetric precautions including but not limited to vaginal bleeding, contractions, leaking of fluid and fetal movement were reviewed in detail with the patient. Please refer to After Visit Summary for other counseling recommendations.  No follow-ups on file.   Hermina Staggers, MD

## 2020-05-27 NOTE — Addendum Note (Signed)
Addended by: Hamilton Capri on: 05/27/2020 03:34 PM   Modules accepted: Orders

## 2020-05-27 NOTE — Patient Instructions (Signed)

## 2020-05-30 ENCOUNTER — Other Ambulatory Visit: Payer: Self-pay | Admitting: Advanced Practice Midwife

## 2020-06-01 ENCOUNTER — Other Ambulatory Visit (HOSPITAL_COMMUNITY)
Admission: RE | Admit: 2020-06-01 | Discharge: 2020-06-01 | Disposition: A | Discharge status: Home / Self Care | Payer: Medicaid Other | Source: Ambulatory Visit | Attending: Family Medicine | Admitting: Family Medicine

## 2020-06-01 DIAGNOSIS — Z01812 Encounter for preprocedural laboratory examination: Secondary | ICD-10-CM | POA: Diagnosis present

## 2020-06-01 DIAGNOSIS — Z20822 Contact with and (suspected) exposure to covid-19: Secondary | ICD-10-CM | POA: Diagnosis not present

## 2020-06-01 LAB — CERVICOVAGINAL ANCILLARY ONLY
Chlamydia: NEGATIVE
Comment: NEGATIVE
Comment: NEGATIVE
Comment: NORMAL
Neisseria Gonorrhea: NEGATIVE
Trichomonas: NEGATIVE

## 2020-06-01 LAB — SARS CORONAVIRUS 2 (TAT 6-24 HRS): SARS Coronavirus 2: NEGATIVE

## 2020-06-03 ENCOUNTER — Inpatient Hospital Stay (HOSPITAL_COMMUNITY)
Admission: AD | Admit: 2020-06-03 | Discharge: 2020-06-08 | DRG: 788 | Disposition: A | Discharge status: Home / Self Care | Payer: Medicaid Other | Attending: Obstetrics and Gynecology | Admitting: Obstetrics and Gynecology

## 2020-06-03 ENCOUNTER — Inpatient Hospital Stay (HOSPITAL_COMMUNITY): Payer: Medicaid Other

## 2020-06-03 ENCOUNTER — Encounter (HOSPITAL_COMMUNITY): Payer: Self-pay | Admitting: Obstetrics and Gynecology

## 2020-06-03 ENCOUNTER — Other Ambulatory Visit: Payer: Self-pay

## 2020-06-03 DIAGNOSIS — R8271 Bacteriuria: Secondary | ICD-10-CM | POA: Diagnosis present

## 2020-06-03 DIAGNOSIS — Z3A37 37 weeks gestation of pregnancy: Secondary | ICD-10-CM | POA: Diagnosis not present

## 2020-06-03 DIAGNOSIS — Z87891 Personal history of nicotine dependence: Secondary | ICD-10-CM | POA: Diagnosis not present

## 2020-06-03 DIAGNOSIS — O99824 Streptococcus B carrier state complicating childbirth: Secondary | ICD-10-CM | POA: Diagnosis present

## 2020-06-03 DIAGNOSIS — O1410 Severe pre-eclampsia, unspecified trimester: Secondary | ICD-10-CM | POA: Diagnosis present

## 2020-06-03 DIAGNOSIS — Z3400 Encounter for supervision of normal first pregnancy, unspecified trimester: Secondary | ICD-10-CM

## 2020-06-03 DIAGNOSIS — O99214 Obesity complicating childbirth: Secondary | ICD-10-CM | POA: Diagnosis present

## 2020-06-03 DIAGNOSIS — O134 Gestational [pregnancy-induced] hypertension without significant proteinuria, complicating childbirth: Secondary | ICD-10-CM | POA: Diagnosis present

## 2020-06-03 DIAGNOSIS — O1414 Severe pre-eclampsia complicating childbirth: Secondary | ICD-10-CM | POA: Diagnosis present

## 2020-06-03 DIAGNOSIS — O139 Gestational [pregnancy-induced] hypertension without significant proteinuria, unspecified trimester: Secondary | ICD-10-CM | POA: Diagnosis present

## 2020-06-03 DIAGNOSIS — O99210 Obesity complicating pregnancy, unspecified trimester: Secondary | ICD-10-CM | POA: Diagnosis present

## 2020-06-03 LAB — COMPREHENSIVE METABOLIC PANEL
ALT: 13 U/L (ref 0–44)
AST: 16 U/L (ref 15–41)
Albumin: 2.9 g/dL — ABNORMAL LOW (ref 3.5–5.0)
Alkaline Phosphatase: 46 U/L (ref 38–126)
Anion gap: 10 (ref 5–15)
BUN: <5 mg/dL — ABNORMAL LOW (ref 6–20)
CO2: 22 mmol/L (ref 22–32)
Calcium: 8.9 mg/dL (ref 8.9–10.3)
Chloride: 103 mmol/L (ref 98–111)
Creatinine, Ser: 0.45 mg/dL (ref 0.44–1.00)
GFR, Estimated: >60 mL/min (ref >60)
Glucose, Bld: 79 mg/dL (ref 70–99)
Potassium: 3.4 mmol/L — ABNORMAL LOW (ref 3.5–5.1)
Sodium: 135 mmol/L (ref 135–145)
Total Bilirubin: 0.4 mg/dL (ref 0.3–1.2)
Total Protein: 6.7 g/dL (ref 6.5–8.1)

## 2020-06-03 LAB — CBC
HCT: 36.8 % (ref 36.0–46.0)
Hemoglobin: 11.9 g/dL — ABNORMAL LOW (ref 12.0–15.0)
MCH: 30.4 pg (ref 26.0–34.0)
MCHC: 32.3 g/dL (ref 30.0–36.0)
MCV: 94.1 fL (ref 80.0–100.0)
Platelets: 218 10*3/uL (ref 150–400)
RBC: 3.91 MIL/uL (ref 3.87–5.11)
RDW: 13.1 % (ref 11.5–15.5)
WBC: 4.3 10*3/uL (ref 4.0–10.5)
nRBC: 0 % (ref 0.0–0.2)

## 2020-06-03 LAB — PROTEIN / CREATININE RATIO, URINE
Creatinine, Urine: 185.51 mg/dL
Protein Creatinine Ratio: 0.16 mg/mg{Cre} — ABNORMAL HIGH (ref 0.00–0.15)
Total Protein, Urine: 29 mg/dL

## 2020-06-03 LAB — RPR: RPR Ser Ql: NONREACTIVE

## 2020-06-03 MED ORDER — LIDOCAINE HCL (PF) 1 % IJ SOLN: 30.0000 mL | INTRAMUSCULAR | Status: DC | PRN

## 2020-06-03 MED ORDER — MISOPROSTOL 25 MCG QUARTER TABLET
25.0000 ug | ORAL_TABLET | ORAL | Status: DC | PRN
  Administered 2020-06-03 – 2020-06-04 (×3): 25 ug via VAGINAL
  Filled 2020-06-03 (×3): qty 1

## 2020-06-03 MED ORDER — TERBUTALINE SULFATE 1 MG/ML IJ SOLN: 0.2500 mg | Freq: Once | INTRAMUSCULAR | Status: DC | PRN

## 2020-06-03 MED ORDER — MISOPROSTOL 50MCG HALF TABLET
50.0000 ug | ORAL_TABLET | ORAL | Status: DC | PRN
  Administered 2020-06-03 (×3): 50 ug via BUCCAL
  Filled 2020-06-03 (×3): qty 1

## 2020-06-03 MED ORDER — OXYCODONE-ACETAMINOPHEN 5-325 MG PO TABS: 2.0000 | ORAL_TABLET | ORAL | Status: DC | PRN

## 2020-06-03 MED ORDER — SOD CITRATE-CITRIC ACID 500-334 MG/5ML PO SOLN
30.0000 mL | ORAL | Status: DC | PRN
  Filled 2020-06-03: qty 15

## 2020-06-03 MED ORDER — SODIUM CHLORIDE 0.9 % IV SOLN
5.0000 10*6.[IU] | Freq: Once | INTRAVENOUS | Status: AC
  Administered 2020-06-04: 5 10*6.[IU] via INTRAVENOUS
  Filled 2020-06-03: qty 5

## 2020-06-03 MED ORDER — OXYTOCIN-SODIUM CHLORIDE 30-0.9 UT/500ML-% IV SOLN
2.5000 [IU]/h | INTRAVENOUS | Status: DC
  Filled 2020-06-03: qty 500

## 2020-06-03 MED ORDER — FENTANYL CITRATE (PF) 100 MCG/2ML IJ SOLN
100.0000 ug | INTRAMUSCULAR | Status: DC | PRN
  Administered 2020-06-04: 100 ug via INTRAVENOUS
  Filled 2020-06-03: qty 2

## 2020-06-03 MED ORDER — OXYCODONE-ACETAMINOPHEN 5-325 MG PO TABS: 1.0000 | ORAL_TABLET | ORAL | Status: DC | PRN

## 2020-06-03 MED ORDER — ONDANSETRON HCL 4 MG/2ML IJ SOLN
4.0000 mg | Freq: Four times a day (QID) | INTRAMUSCULAR | Status: DC | PRN
  Administered 2020-06-05: 4 mg via INTRAVENOUS
  Filled 2020-06-03: qty 2

## 2020-06-03 MED ORDER — PENICILLIN G POT IN DEXTROSE 60000 UNIT/ML IV SOLN
3.0000 10*6.[IU] | INTRAVENOUS | Status: DC
  Administered 2020-06-04 – 2020-06-05 (×5): 3 10*6.[IU] via INTRAVENOUS
  Filled 2020-06-03 (×5): qty 50

## 2020-06-03 MED ORDER — OXYTOCIN BOLUS FROM INFUSION: 333.0000 mL | Freq: Once | INTRAVENOUS | Status: DC

## 2020-06-03 MED ORDER — ACETAMINOPHEN 325 MG PO TABS
650.0000 mg | ORAL_TABLET | ORAL | Status: DC | PRN
  Administered 2020-06-05: 650 mg via ORAL
  Filled 2020-06-03: qty 2

## 2020-06-03 MED ORDER — LACTATED RINGERS IV SOLN: INTRAVENOUS | Status: DC

## 2020-06-03 MED ORDER — LACTATED RINGERS IV SOLN: 500.0000 mL | INTRAVENOUS | Status: DC | PRN

## 2020-06-03 NOTE — Progress Notes (Signed)
Labor Progress Note Carmen Price is a 29 y.o. G1P0000 at [redacted]w[redacted]d presented for IOL secondary to gHTN.  S: Patient comfortable, no concerns at this time.   O:  BP 136/82   Pulse 91   Temp 98.9 F (37.2 C) (Oral)   Resp 18   Ht 5\' 7"  (1.702 m)   Wt (!) 147.5 kg   LMP 09/12/2019   BMI 50.93 kg/m  EFM: 130 bpm/moderate variability/accels without decels Intermittent contractions   CVE: Dilation: 1 Effacement (%): Thick Cervical Position: Anterior Station: -3 Presentation: Vertex Exam by:: Dr. [email or web address]   A&P: 29 y.o. G1P0000 103w0d presented for IOL secondary to gHTN. #Labor: Progressing well. Cytotec x2. Foley balloon attempted but not in place due to position of cervix. #Pain: prn #FWB: category 1 #GBS positive >PCN #gHTN: Normotensive BP 136/82. Continue to monitor.   [redacted]w[redacted]d, DO 12:54 PM

## 2020-06-03 NOTE — Progress Notes (Signed)
Labor Progress Note Carmen Price is a 29 y.o. G1P0000 at [redacted]w[redacted]d presented for IOL secondary to gHTN.  S: Patient doing well, denies any current concerns.  O:  BP 128/78   Pulse 89   Temp 98.9 F (37.2 C) (Oral)   Resp 18   Ht 5\' 7"  (1.702 m)   Wt (!) 147.5 kg   LMP 09/12/2019   BMI 50.93 kg/m  EFM: 130 bpm/moderate variability/accels without decels Intermittent contractions   CVE: Dilation: 1.5 Effacement (%): Thick Cervical Position: Middle Station: -3 Presentation: Vertex Exam by:: [email or web address], RN  A&P: 29 y.o. G1P0000 [redacted]w[redacted]d presented for IOL secondary to gHTN. #Labor: Cytotec x3. FB out at 1700 and exam unchanged. Patient does not want to replace FB. Will redose cytotec and check in 4 hours.   #Pain: prn IV pain meds #FWB: category 1 #GBS positive >PCN #gHTN: Normotensive BP 128/78. Continue to monitor.   [redacted]w[redacted]d, MD 9:11 PM

## 2020-06-03 NOTE — H&P (Addendum)
OBSTETRIC ADMISSION HISTORY AND PHYSICAL  Carmen Price is a 29 y.o. female G1P0000 with IUP at [redacted]w[redacted]d by LMP presenting for IOL for gHTN. She reports +FMs, No LOF, no VB, no blurry vision, headaches or peripheral edema, and RUQ pain.  She plans on both breast and bottle feeding. She request POPs for birth control but still undecieded. She received her prenatal care at [facility]   Dating: By LMP --->  Estimated Date of Delivery: 06/24/20  Sono:    05/21/20 @[redacted]w[redacted]d , CWD, normal anatomy, cephalic presentation, 2955g, EFW  Prenatal History/Complications:  -gHTN, normal PEC workup  -BMI 51 -GBS positive (urine)  Past Medical History: Past Medical History:  Diagnosis Date  . Anemia   . Anxiety   . Blood transfusion during current hospitalization 02/03/2015  . Blood transfusion without reported diagnosis   . Depression   . GERD (gastroesophageal reflux disease)   . Headache   . Pregnancy induced hypertension   . Symptomatic anemia 02/03/2015  . Vaginal Pap smear, abnormal     Past Surgical History: Past Surgical History:  Procedure Laterality Date  . TONSILLECTOMY      Obstetrical History: OB History    Gravida  1   Para  0   Term  0   Preterm  0   AB  0   Living  0     SAB  0   TAB  0   Ectopic  0   Multiple  0   Live Births              Social History Social History   Socioeconomic History  . Marital status: Single    Spouse name: Not on file  . Number of children: Not on file  . Years of education: Not on file  . Highest education level: Not on file  Occupational History  . Not on file  Tobacco Use  . Smoking status: Former Smoker    Packs/day: 0.25    Years: 4.00    Pack years: 1.00    Types: Cigarettes  . Smokeless tobacco: Never Used  Vaping Use  . Vaping Use: Never used  Substance and Sexual Activity  . Alcohol use: Not Currently    Alcohol/week: 0.0 standard drinks    Comment: Social  . Drug use: No  . Sexual activity: Yes     Birth control/protection: None  Other Topics Concern  . Not on file  Social History Narrative  . Not on file   Social Determinants of Health   Financial Resource Strain:   . Difficulty of Paying Living Expenses: Not on file  Food Insecurity:   . Worried About 04/05/2015 in the Last Year: Not on file  . Ran Out of Food in the Last Year: Not on file  Transportation Needs:   . Lack of Transportation (Medical): Not on file  . Lack of Transportation (Non-Medical): Not on file  Physical Activity:   . Days of Exercise per Week: Not on file  . Minutes of Exercise per Session: Not on file  Stress:   . Feeling of Stress : Not on file  Social Connections:   . Frequency of Communication with Friends and Family: Not on file  . Frequency of Social Gatherings with Friends and Family: Not on file  . Attends Religious Services: Not on file  . Active Member of Clubs or Organizations: Not on file  . Attends Programme researcher, broadcasting/film/video Meetings: Not on file  . Marital  Status: Not on file    Family History: Family History  Problem Relation Age of Onset  . Hypertension Mother   . Arthritis Mother   . Depression Mother   . Diabetes Father   . Diabetes Maternal Grandmother   . Cancer Maternal Grandmother   . Hypertension Maternal Grandmother   . Kidney disease Maternal Grandmother   . Asthma Brother   . ADD / ADHD Maternal Uncle   . Diabetes Maternal Uncle   . Hypertension Maternal Uncle     Allergies: No Known Allergies  Medications Prior to Admission  Medication Sig Dispense Refill Last Dose  . aspirin EC 81 MG tablet Take 1 tablet (81 mg total) by mouth daily. Take after 12 weeks for prevention of preeclampsia later in pregnancy 300 tablet 0 06/02/2020 at Unknown time  . Prenat-Fe Poly-Methfol-FA-DHA (VITAFOL ULTRA) 29-0.6-0.4-200 MG CAPS Take 1 tablet by mouth daily. 30 capsule 12 06/02/2020 at Unknown time  . Elastic Bandages & Supports (COMFORT FIT MATERNITY SUPP SM) MISC  Wear as directed. 1 each 0     Review of Systems   All systems reviewed and negative except as stated in HPI  Blood pressure 132/74, pulse (!) 103, temperature 98.9 F (37.2 C), temperature source Oral, resp. rate 18, height 5\' 7"  (1.702 m), weight (!) 147.5 kg, last menstrual period 09/12/2019. General appearance: alert and no distress Chest: normal respiratory effort  Abdomen: soft, non-tender; gravid Extremities: no sign of DVT, no LE edema noted bilaterally  Presentation: cephalic Fetal monitoringBaseline: 130 bpm, Variability: Good {> 6 bpm), Accelerations: Reactive and Decelerations: Absent Uterine activity: intermittent contractions  Dilation: Closed (external 1 cm; inner os closed) Effacement (%): Thick Station: -3 Exam by:: [email or web address], RNC Prenatal labs: ABO, Rh: O/Positive/-- (05/27 1418) Antibody: Negative (05/27 1418) Rubella: 6.15 (05/27 1418) RPR: Non Reactive (09/28 1103)  HBsAg: Negative (05/27 1418)  HIV: Non Reactive (09/28 1103)  GBS:   positive (urine) 2 hr Glucola 88 (03/31/2020( Genetic screening  Normal (01/02/2020) Anatomy 03/04/2020 normal (05/21/2020)  Prenatal Transfer Tool  Maternal Diabetes: No Genetic Screening: Normal Maternal Ultrasounds/Referrals: Normal Fetal Ultrasounds or other Referrals:  None Maternal Substance Abuse:  No Significant Maternal Medications:  None Significant Maternal Lab Results: Group B Strep positive  No results found for this or any previous visit (from the past 24 hour(s)).  Patient Active Problem List   Diagnosis Date Noted  . Gestational hypertension 06/03/2020  . GBS bacteriuria 05/15/2020  . Gestational hypertension, third trimester 05/12/2020  . Pregnancy headache in third trimester 05/12/2020  . Obesity during pregnancy, antepartum 11/28/2019  . Supervision of normal first pregnancy, antepartum 11/21/2019  . ACNE VULGARIS, FACIAL 03/13/2007    Assessment/Plan:  Carmen Price is a 29 y.o. G1P0000 at [redacted]w[redacted]d  here for IOL for gHTN.  #IOL: Presents for induction of labor secondary to gHTN. Cytotec x1. Continue to monitor progression.  #gHTN: Normotensive BP 132/74, no concerning features for preeclampsia. Patient took aspirin throughout pregnancy for prevention. Continue to monitor BP. No pharmacologic treatment at this time.   #Pain: desires unmedicated   #FWB: Category 1 #ID:GBS Positive-->PCN #MOF: both breast and bottle feeding #MOC:POPs but still undecided, deferred discussion per patient preference  #Circ:  no  Anupa Ganta, DO  06/03/2020, 8:43 AM   I saw and evaluated the patient. I agree with the findings and the plan of care as documented in the resident's note.  14/07/2019, MD [facility] Family Medicine Fellow, [facility] for [facility] [employer] [facility]

## 2020-06-03 NOTE — Progress Notes (Addendum)
Labor Progress Note Carmen Price is a 29 y.o. G1P0000 at [redacted]w[redacted]d presented for IOL secondary to gHTN.  S: Patient doing well, denies any current concerns.  O:  BP 125/83   Pulse (!) 103   Temp 98.7 F (37.1 C) (Oral)   Resp 16   Ht 5\' 7"  (1.702 m)   Wt (!) 147.5 kg   LMP 09/12/2019   BMI 50.93 kg/m  EFM: 135 bpm/moderate variability/accels without decels Intermittent contractions   CVE: Dilation: 1 Effacement (%): 50 Cervical Position: Anterior Station: -3 Presentation: Vertex Exam by:: Dr. [email or web address]   A&P: 29 y.o. G1P0000 [redacted]w[redacted]d presented for IOL secondary to gHTN. #Labor: Cytotec x3. Foley balloon in place with assistance of speculum, performed by Dr. [redacted]w[redacted]d. Will reassess in about 4 hours. #Pain: prn #FWB: category 1 #GBS positive >PCN #gHTN: Normotensive BP 125/83. Continue to monitor.   Myriam Jacobson, DO 5:17 PM

## 2020-06-04 LAB — CBC
HCT: 34 % — ABNORMAL LOW (ref 36.0–46.0)
Hemoglobin: 11.2 g/dL — ABNORMAL LOW (ref 12.0–15.0)
MCH: 30.2 pg (ref 26.0–34.0)
MCHC: 32.9 g/dL (ref 30.0–36.0)
MCV: 91.6 fL (ref 80.0–100.0)
Platelets: 224 10*3/uL (ref 150–400)
RBC: 3.71 MIL/uL — ABNORMAL LOW (ref 3.87–5.11)
RDW: 13 % (ref 11.5–15.5)
WBC: 6.8 10*3/uL (ref 4.0–10.5)
nRBC: 0 % (ref 0.0–0.2)

## 2020-06-04 LAB — COMPREHENSIVE METABOLIC PANEL
ALT: 13 U/L (ref 0–44)
AST: 13 U/L — ABNORMAL LOW (ref 15–41)
Albumin: 2.7 g/dL — ABNORMAL LOW (ref 3.5–5.0)
Alkaline Phosphatase: 48 U/L (ref 38–126)
Anion gap: 10 (ref 5–15)
BUN: <5 mg/dL — ABNORMAL LOW (ref 6–20)
CO2: 20 mmol/L — ABNORMAL LOW (ref 22–32)
Calcium: 8.2 mg/dL — ABNORMAL LOW (ref 8.9–10.3)
Chloride: 106 mmol/L (ref 98–111)
Creatinine, Ser: 0.41 mg/dL — ABNORMAL LOW (ref 0.44–1.00)
GFR, Estimated: >60 mL/min (ref >60)
Glucose, Bld: 83 mg/dL (ref 70–99)
Potassium: 3.4 mmol/L — ABNORMAL LOW (ref 3.5–5.1)
Sodium: 136 mmol/L (ref 135–145)
Total Bilirubin: 0.6 mg/dL (ref 0.3–1.2)
Total Protein: 6.3 g/dL — ABNORMAL LOW (ref 6.5–8.1)

## 2020-06-04 LAB — PROTEIN / CREATININE RATIO, URINE
Creatinine, Urine: 33.43 mg/dL
Protein Creatinine Ratio: 0.24 mg/mg{Cre} — ABNORMAL HIGH (ref 0.00–0.15)
Total Protein, Urine: 8 mg/dL

## 2020-06-04 LAB — MAGNESIUM: Magnesium: 3.9 mg/dL — ABNORMAL HIGH (ref 1.7–2.4)

## 2020-06-04 MED ORDER — LABETALOL HCL 5 MG/ML IV SOLN
INTRAVENOUS | Status: AC
  Administered 2020-06-04: 20 mg via INTRAVENOUS
  Filled 2020-06-04: qty 4

## 2020-06-04 MED ORDER — MAGNESIUM SULFATE 40 GM/1000ML IV SOLN
2.0000 g/h | INTRAVENOUS | Status: DC
  Administered 2020-06-05 (×2): 2 g/h via INTRAVENOUS
  Filled 2020-06-04 (×2): qty 1000

## 2020-06-04 MED ORDER — OXYTOCIN-SODIUM CHLORIDE 30-0.9 UT/500ML-% IV SOLN
1.0000 m[IU]/min | INTRAVENOUS | Status: DC
  Administered 2020-06-04: 2 m[IU]/min via INTRAVENOUS

## 2020-06-04 MED ORDER — HYDRALAZINE HCL 20 MG/ML IJ SOLN: 10.0000 mg | INTRAMUSCULAR | Status: DC | PRN

## 2020-06-04 MED ORDER — LABETALOL HCL 5 MG/ML IV SOLN: 40.0000 mg | INTRAVENOUS | Status: DC | PRN

## 2020-06-04 MED ORDER — MISOPROSTOL 50MCG HALF TABLET
50.0000 ug | ORAL_TABLET | Freq: Once | ORAL | Status: AC
  Administered 2020-06-04: 50 ug via BUCCAL
  Filled 2020-06-04: qty 1

## 2020-06-04 MED ORDER — LABETALOL HCL 5 MG/ML IV SOLN: 80.0000 mg | INTRAVENOUS | Status: DC | PRN

## 2020-06-04 MED ORDER — LABETALOL HCL 5 MG/ML IV SOLN
40.0000 mg | INTRAVENOUS | Status: DC | PRN
  Administered 2020-06-04: 40 mg via INTRAVENOUS
  Filled 2020-06-04: qty 8

## 2020-06-04 MED ORDER — LABETALOL HCL 5 MG/ML IV SOLN
20.0000 mg | INTRAVENOUS | Status: DC | PRN
  Administered 2020-06-05: 20 mg via INTRAVENOUS
  Filled 2020-06-04: qty 4

## 2020-06-04 MED ORDER — TERBUTALINE SULFATE 1 MG/ML IJ SOLN: 0.2500 mg | Freq: Once | INTRAMUSCULAR | Status: DC | PRN

## 2020-06-04 MED ORDER — MAGNESIUM SULFATE BOLUS VIA INFUSION
4.0000 g | Freq: Once | INTRAVENOUS | Status: AC
  Administered 2020-06-04: 4 g via INTRAVENOUS
  Filled 2020-06-04: qty 1000

## 2020-06-04 MED ORDER — LABETALOL HCL 5 MG/ML IV SOLN: 20.0000 mg | INTRAVENOUS | Status: DC | PRN

## 2020-06-04 NOTE — Progress Notes (Signed)
Labor Progress Note De'Nera D Bertram is a 29 y.o. G1P0000 at [redacted]w[redacted]d presented for IOL for gHTN on 06/03/20.    S:  Feeling sleepy, would like to take nap   O:  BP 129/84   Pulse 87   Temp 98.2 F (36.8 C) (Oral)   Resp 18   Ht 5\' 7"  (1.702 m)   Wt (!) 147.5 kg   LMP 09/12/2019   BMI 50.93 kg/m  EFM: baseline 130 bpm/ moderate variability/ + accels/ no decels  Toco: q2-3 min  SVE: Dilation: 1 Effacement (%): Thick Cervical Position: Posterior Station: -3 Presentation: Vertex Exam by:: [email or web address] P RN      A/P: 29 y.o. G1P0000 [redacted]w[redacted]d  1. Induction of Labor: s/p cytotec x7, FB placed at 0830. Likely transition to pitocin at next due time   2. PEC w SF: based on severe range pressures. S/p IV Labetalol x2, will initiate magnesium therapy at this time. Other PEC labs normal.   3. FWB: cat 1 4. Pain: pain meds, epidural prn     [redacted]w[redacted]d, MD [phone]

## 2020-06-04 NOTE — Progress Notes (Signed)
Labor Progress Note Carmen Price is a 29 y.o. G1P0000 at [redacted]w[redacted]d presented for IOL secondary to gHTN.  S: Patient doing well, denies any current concerns.  O:  BP 128/78   Pulse 89   Temp 98.9 F (37.2 C) (Oral)   Resp 18   Ht 5\' 7"  (1.702 m)   Wt (!) 147.5 kg   LMP 09/12/2019   BMI 50.93 kg/m  EFM: 130 bpm/moderate variability/accels without decels Intermittent contractions   CVE: Dilation: 1.5 Effacement (%): 50 Cervical Position: Middle Station: -3 Presentation: Vertex Exam by:: [email or web address], Resident  A&P: 29 y.o. G1P0000 [redacted]w[redacted]d presented for IOL secondary to gHTN. #Labor: Based on SVE, will redose vaginal cytotec. Will consider cytotec vs pitocin at next check.   #Pain: prn IV pain meds #FWB: category 1 #GBS positive >PCN #gHTN: Normotensive BP 128/78. Continue to monitor.   [redacted]w[redacted]d, MD 1:19 AM

## 2020-06-04 NOTE — Progress Notes (Addendum)
Labor Progress Note Carmen Price is a 29 y.o. G1P0000 at [redacted]w[redacted]d presented for IOL for gHTN on 06/03/20.    S:  Feeling cramping more intensely   O:  BP (!) 148/52   Pulse (!) 106   Temp 98.8 F (37.1 C) (Oral)   Resp 16   Ht 5\' 7"  (1.702 m)   Wt (!) 147.5 kg   LMP 09/12/2019   BMI 50.93 kg/m  EFM: baseline 130 bpm/ moderate variability/ + accels/ no decels  Toco: q4-5 min  SVE: Dilation: 4 Effacement (%): 50 Cervical Position: Posterior Station: -3 Presentation: Vertex Exam by:: [email or web address] RN      A/P: 29 y.o. G1P0000 [redacted]w[redacted]d  1. Induction of Labor: s/p cytotec x8, FB placed at 0830, now out. Will transition to pitocin.   2. PEC w SF: Based on SR pressures. Mg started noon today, no other signs/symptoms. Continue to monitor. Most recent pressures have been MR, not requiring IV antihypertensives.   3. FWB: cat 1 4. Pain: pain meds, epidural prn  5. GBS pos, start PCN at this time     [redacted]w[redacted]d, MD

## 2020-06-04 NOTE — Progress Notes (Signed)
Labor Progress Note Carmen Price is a 29 y.o. G1P0000 at [redacted]w[redacted]d presented for IOL for gHTN on 06/03/20.    S: Doing well without complaints.  O:  BP 136/81    Pulse 92    Temp 99.2 F (37.3 C) (Oral)    Resp 19    Ht 5\' 7"  (1.702 m)    Wt (!) 147.5 kg    LMP 09/12/2019    BMI 50.93 kg/m  EFM: baseline 130 bpm/ moderate variability/ + accels/ no decels  Toco: q4-5 min  SVE: Dilation: 4 Effacement (%): 50 Cervical Position: Posterior Station: -3 Presentation: Vertex Exam by:: [email or web address] RN      A/P: 29 y.o. G1P0000 [redacted]w[redacted]d  #IOL: s/p cytotec x8 and FB placement. Pitocin started @1830 , continue to titrate.  #PEC w SF: Based on SR pressures. Mg started noon today (12/2), no other signs/symptoms. Received IV labetalol x2 (last 1140 12/2). BP well controlled. Continue to monitor. Repeat preE labs pending.  #FWB: cat 1 #Pain: pain meds, epidural prn  #GBS pos urine, PCN, adequate ppx #BMI 51    09-[birth year]1974, MD

## 2020-06-04 NOTE — Progress Notes (Signed)
Labor Progress Note Carmen Price is a 29 y.o. G1P0000 at [redacted]w[redacted]d presented for IOL secondary to gHTN.  S: Patient doing well, denies any current concerns.  O:  BP 139/77   Pulse 81   Temp 98.6 F (37 C) (Oral)   Resp 18   Ht 5\' 7"  (1.702 m)   Wt (!) 147.5 kg   LMP 09/12/2019   BMI 50.93 kg/m  EFM: 130 bpm/moderate variability/accels without decels Toco: q4-5   CVE: Dilation: 1 Effacement (%): Thick Cervical Position: Posterior Station: -3 Presentation: Vertex (confirmed w/ 11/12/2019) Exam by:: [email or web address] RN  A&P: 29 y.o. G1P0000 [redacted]w[redacted]d presented for IOL secondary to gHTN. #Labor: Based on SVE, will redose vaginal cytotec. Patient is agreeable to FB placement at next check.   #Pain: prn IV pain meds #FWB: category 1 #GBS positive >PCN #gHTN: Normotensive BP 139/77. Continue to monitor.   [redacted]w[redacted]d, MD 6:13 AM

## 2020-06-04 NOTE — Progress Notes (Signed)
Labor Progress Note Carmen Price is a 29 y.o. G1P0000 at [redacted]w[redacted]d presented for IOL for gHTN on 06/03/20. She is now s/p cytotec x6 doses (3 vaginal and 3 buccal) and two FB placement attempts. Dr. Alysia Penna requested to place FB as her cervix is both posterior and at an acute angle.  S:  Pt resting in bed, amenable to allowing Dr. Alysia Penna to place FB, declined any IV medication during procedure. Tolerated it well, excited for labor to begin progressing.   O:  BP (!) 155/88   Pulse 92   Temp 98.2 F (36.8 C) (Oral)   Resp 18   Ht 5\' 7"  (1.702 m)   Wt (!) 325 lb 3.2 oz (147.5 kg)   LMP 09/12/2019   BMI 50.93 kg/m  EFM: baseline 130 bpm/ moderate variability/ + accels/ no decels  Toco/IUPC: irregular to UI SVE: Dilation: 1 Effacement (%): Thick Cervical Position: Posterior Station: -3 Presentation: Vertex Exam by:: [email or web address] P RN Pitocin: none  Cook's cath placed easily by Dr. Patton Salles using a speculum and ring forceps.   A/P: 29 y.o. G1P0000 [redacted]w[redacted]d  1. Labor: latent labor 2. FWB: cat 1 3. Pain: well controlled without pain meds 4. 50mg  buccal cytotec to be given at 0930, will monitor CC progress and move to Pitocin titration when appropriate   [redacted]w[redacted]d, CNM [phone]

## 2020-06-05 ENCOUNTER — Inpatient Hospital Stay (HOSPITAL_COMMUNITY): Payer: Medicaid Other | Admitting: Anesthesiology

## 2020-06-05 ENCOUNTER — Encounter (HOSPITAL_COMMUNITY)
Admission: AD | Disposition: A | Discharge status: Home / Self Care | Payer: Self-pay | Source: Home / Self Care | Attending: Obstetrics and Gynecology

## 2020-06-05 DIAGNOSIS — O1414 Severe pre-eclampsia complicating childbirth: Secondary | ICD-10-CM

## 2020-06-05 DIAGNOSIS — Z3A37 37 weeks gestation of pregnancy: Secondary | ICD-10-CM

## 2020-06-05 LAB — POCT I-STAT EG7
Acid-base deficit: 3 mmol/L — ABNORMAL HIGH (ref 0.0–2.0)
Bicarbonate: 22.9 mmol/L (ref 20.0–28.0)
Calcium, Ion: 1.04 mmol/L — ABNORMAL LOW (ref 1.15–1.40)
HCT: 40 % (ref 36.0–46.0)
Hemoglobin: 13.6 g/dL (ref 12.0–15.0)
O2 Saturation: 87 %
Potassium: 3.3 mmol/L — ABNORMAL LOW (ref 3.5–5.1)
Sodium: 139 mmol/L (ref 135–145)
TCO2: 24 mmol/L (ref 22–32)
pCO2, Ven: 41 mmHg — ABNORMAL LOW (ref 44.0–60.0)
pH, Ven: 7.355 (ref 7.250–7.430)
pO2, Ven: 56 mmHg — ABNORMAL HIGH (ref 32.0–45.0)

## 2020-06-05 LAB — CBC
HCT: 35.7 % — ABNORMAL LOW (ref 36.0–46.0)
Hemoglobin: 11.6 g/dL — ABNORMAL LOW (ref 12.0–15.0)
MCH: 30.1 pg (ref 26.0–34.0)
MCHC: 32.5 g/dL (ref 30.0–36.0)
MCV: 92.7 fL (ref 80.0–100.0)
Platelets: 231 10*3/uL (ref 150–400)
RBC: 3.85 MIL/uL — ABNORMAL LOW (ref 3.87–5.11)
RDW: 13.2 % (ref 11.5–15.5)
WBC: 8 10*3/uL (ref 4.0–10.5)
nRBC: 0 % (ref 0.0–0.2)

## 2020-06-05 LAB — COMPREHENSIVE METABOLIC PANEL
ALT: 13 U/L (ref 0–44)
AST: 17 U/L (ref 15–41)
Albumin: 2.8 g/dL — ABNORMAL LOW (ref 3.5–5.0)
Alkaline Phosphatase: 60 U/L (ref 38–126)
Anion gap: 12 (ref 5–15)
BUN: <5 mg/dL — ABNORMAL LOW (ref 6–20)
CO2: 20 mmol/L — ABNORMAL LOW (ref 22–32)
Calcium: 7.5 mg/dL — ABNORMAL LOW (ref 8.9–10.3)
Chloride: 104 mmol/L (ref 98–111)
Creatinine, Ser: 0.44 mg/dL (ref 0.44–1.00)
GFR, Estimated: >60 mL/min (ref >60)
Glucose, Bld: 101 mg/dL — ABNORMAL HIGH (ref 70–99)
Potassium: 3.4 mmol/L — ABNORMAL LOW (ref 3.5–5.1)
Sodium: 136 mmol/L (ref 135–145)
Total Bilirubin: 1 mg/dL (ref 0.3–1.2)
Total Protein: 6.5 g/dL (ref 6.5–8.1)

## 2020-06-05 LAB — PREPARE RBC (CROSSMATCH)

## 2020-06-05 SURGERY — Surgical Case
Anesthesia: Spinal

## 2020-06-05 MED ORDER — NALBUPHINE HCL 10 MG/ML IJ SOLN: 5.0000 mg | Freq: Once | INTRAMUSCULAR | Status: DC | PRN

## 2020-06-05 MED ORDER — MEPERIDINE HCL 25 MG/ML IJ SOLN: 6.2500 mg | INTRAMUSCULAR | Status: DC | PRN

## 2020-06-05 MED ORDER — MAGNESIUM SULFATE 40 GM/1000ML IV SOLN
INTRAVENOUS | Status: AC
  Filled 2020-06-05: qty 1000

## 2020-06-05 MED ORDER — HYDROMORPHONE HCL 1 MG/ML IJ SOLN
0.2500 mg | INTRAMUSCULAR | Status: DC | PRN
  Administered 2020-06-05: 0.5 mg via INTRAVENOUS

## 2020-06-05 MED ORDER — DEXAMETHASONE SODIUM PHOSPHATE 10 MG/ML IJ SOLN
INTRAMUSCULAR | Status: AC
  Filled 2020-06-05: qty 1

## 2020-06-05 MED ORDER — OXYTOCIN-SODIUM CHLORIDE 30-0.9 UT/500ML-% IV SOLN
INTRAVENOUS | Status: AC
  Filled 2020-06-05: qty 500

## 2020-06-05 MED ORDER — DIPHENHYDRAMINE HCL 25 MG PO CAPS: 25.0000 mg | ORAL_CAPSULE | ORAL | Status: DC | PRN

## 2020-06-05 MED ORDER — PROMETHAZINE HCL 25 MG/ML IJ SOLN
INTRAMUSCULAR | Status: AC
  Filled 2020-06-05: qty 1

## 2020-06-05 MED ORDER — SODIUM CHLORIDE 0.9% FLUSH: 3.0000 mL | INTRAVENOUS | Status: DC | PRN

## 2020-06-05 MED ORDER — NALOXONE HCL 4 MG/10ML IJ SOLN
1.0000 ug/kg/h | INTRAVENOUS | Status: DC | PRN
  Filled 2020-06-05: qty 5

## 2020-06-05 MED ORDER — HYDROMORPHONE HCL 1 MG/ML IJ SOLN
INTRAMUSCULAR | Status: AC
  Filled 2020-06-05: qty 0.5

## 2020-06-05 MED ORDER — TRANEXAMIC ACID-NACL 1000-0.7 MG/100ML-% IV SOLN
INTRAVENOUS | Status: AC
  Filled 2020-06-05: qty 100

## 2020-06-05 MED ORDER — FENTANYL CITRATE (PF) 100 MCG/2ML IJ SOLN
INTRAMUSCULAR | Status: AC
  Filled 2020-06-05: qty 2

## 2020-06-05 MED ORDER — SCOPOLAMINE 1 MG/3DAYS TD PT72
MEDICATED_PATCH | TRANSDERMAL | Status: AC
  Filled 2020-06-05: qty 1

## 2020-06-05 MED ORDER — BUPIVACAINE HCL (PF) 0.5 % IJ SOLN
INTRAMUSCULAR | Status: AC
  Filled 2020-06-05: qty 30

## 2020-06-05 MED ORDER — NALBUPHINE HCL 10 MG/ML IJ SOLN: 5.0000 mg | INTRAMUSCULAR | Status: DC | PRN

## 2020-06-05 MED ORDER — ONDANSETRON HCL 4 MG/2ML IJ SOLN
INTRAMUSCULAR | Status: AC
  Filled 2020-06-05: qty 2

## 2020-06-05 MED ORDER — SCOPOLAMINE 1 MG/3DAYS TD PT72
1.0000 | MEDICATED_PATCH | Freq: Once | TRANSDERMAL | Status: DC
  Administered 2020-06-06: 1.5 mg via TRANSDERMAL

## 2020-06-05 MED ORDER — ONDANSETRON HCL 4 MG/2ML IJ SOLN: 4.0000 mg | Freq: Three times a day (TID) | INTRAMUSCULAR | Status: DC | PRN

## 2020-06-05 MED ORDER — SOD CITRATE-CITRIC ACID 500-334 MG/5ML PO SOLN
30.0000 mL | ORAL | Status: AC
  Administered 2020-06-05: 30 mL via ORAL

## 2020-06-05 MED ORDER — PHENYLEPHRINE HCL-NACL 20-0.9 MG/250ML-% IV SOLN
INTRAVENOUS | Status: DC | PRN
  Administered 2020-06-05: 60 ug/min via INTRAVENOUS

## 2020-06-05 MED ORDER — DEXAMETHASONE SODIUM PHOSPHATE 10 MG/ML IJ SOLN
INTRAMUSCULAR | Status: DC | PRN
  Administered 2020-06-05: 10 mg via INTRAVENOUS

## 2020-06-05 MED ORDER — DEXTROSE 5 % IV SOLN
3.0000 g | INTRAVENOUS | Status: AC
  Administered 2020-06-05: 3 g via INTRAVENOUS
  Filled 2020-06-05: qty 3000

## 2020-06-05 MED ORDER — DIPHENHYDRAMINE HCL 50 MG/ML IJ SOLN
12.5000 mg | INTRAMUSCULAR | Status: DC | PRN
  Administered 2020-06-05: 12.5 mg via INTRAVENOUS

## 2020-06-05 MED ORDER — SODIUM CHLORIDE 0.9 % IV SOLN
INTRAVENOUS | Status: AC
  Filled 2020-06-05: qty 500

## 2020-06-05 MED ORDER — MORPHINE SULFATE (PF) 0.5 MG/ML IJ SOLN
INTRAMUSCULAR | Status: DC | PRN
  Administered 2020-06-05: .15 mg via INTRATHECAL

## 2020-06-05 MED ORDER — BUPIVACAINE HCL (PF) 0.5 % IJ SOLN
INTRAMUSCULAR | Status: DC | PRN
  Administered 2020-06-05: 30 mL

## 2020-06-05 MED ORDER — OXYTOCIN-SODIUM CHLORIDE 30-0.9 UT/500ML-% IV SOLN: 1.0000 m[IU]/min | INTRAVENOUS | Status: DC

## 2020-06-05 MED ORDER — EPHEDRINE SULFATE 50 MG/ML IJ SOLN
INTRAMUSCULAR | Status: DC | PRN
  Administered 2020-06-05: 5 mg via INTRAVENOUS

## 2020-06-05 MED ORDER — KETOROLAC TROMETHAMINE 30 MG/ML IJ SOLN
30.0000 mg | Freq: Four times a day (QID) | INTRAMUSCULAR | Status: AC | PRN
  Administered 2020-06-06: 30 mg via INTRAMUSCULAR

## 2020-06-05 MED ORDER — ACETAMINOPHEN 500 MG PO TABS: 1000.0000 mg | ORAL_TABLET | Freq: Four times a day (QID) | ORAL | Status: DC

## 2020-06-05 MED ORDER — PROMETHAZINE HCL 25 MG/ML IJ SOLN: 6.2500 mg | INTRAMUSCULAR | Status: DC | PRN

## 2020-06-05 MED ORDER — OXYCODONE HCL 5 MG/5ML PO SOLN: 5.0000 mg | Freq: Once | ORAL | Status: DC | PRN

## 2020-06-05 MED ORDER — SODIUM CHLORIDE 0.9 % IV SOLN
500.0000 mg | INTRAVENOUS | Status: AC
  Administered 2020-06-05: 500 mg via INTRAVENOUS

## 2020-06-05 MED ORDER — KETOROLAC TROMETHAMINE 30 MG/ML IJ SOLN: 30.0000 mg | Freq: Once | INTRAMUSCULAR | Status: AC | PRN

## 2020-06-05 MED ORDER — OXYTOCIN-SODIUM CHLORIDE 30-0.9 UT/500ML-% IV SOLN
INTRAVENOUS | Status: DC | PRN
  Administered 2020-06-05 (×2): 30 [IU] via INTRAVENOUS

## 2020-06-05 MED ORDER — SCOPOLAMINE 1 MG/3DAYS TD PT72
MEDICATED_PATCH | TRANSDERMAL | Status: DC | PRN
  Administered 2020-06-05: 1 via TRANSDERMAL

## 2020-06-05 MED ORDER — MORPHINE SULFATE (PF) 0.5 MG/ML IJ SOLN
INTRAMUSCULAR | Status: AC
  Filled 2020-06-05: qty 10

## 2020-06-05 MED ORDER — SODIUM CHLORIDE 0.9 % IV SOLN: INTRAVENOUS | Status: DC | PRN

## 2020-06-05 MED ORDER — OXYCODONE HCL 5 MG PO TABS: 5.0000 mg | ORAL_TABLET | Freq: Once | ORAL | Status: DC | PRN

## 2020-06-05 MED ORDER — KETOROLAC TROMETHAMINE 30 MG/ML IJ SOLN: 30.0000 mg | Freq: Four times a day (QID) | INTRAMUSCULAR | Status: AC | PRN

## 2020-06-05 MED ORDER — PHENYLEPHRINE HCL (PRESSORS) 10 MG/ML IV SOLN
INTRAVENOUS | Status: DC | PRN
  Administered 2020-06-05: 80 ug via INTRAVENOUS
  Administered 2020-06-05: 120 ug via INTRAVENOUS

## 2020-06-05 MED ORDER — DEXTROSE 5 % IV SOLN
INTRAVENOUS | Status: AC
  Filled 2020-06-05: qty 3000

## 2020-06-05 MED ORDER — BUPIVACAINE IN DEXTROSE 0.75-8.25 % IT SOLN
INTRATHECAL | Status: DC | PRN
  Administered 2020-06-05: 1.7 mL via INTRATHECAL

## 2020-06-05 MED ORDER — DIPHENHYDRAMINE HCL 50 MG/ML IJ SOLN
INTRAMUSCULAR | Status: AC
  Filled 2020-06-05: qty 1

## 2020-06-05 MED ORDER — FENTANYL CITRATE (PF) 100 MCG/2ML IJ SOLN
INTRAMUSCULAR | Status: DC | PRN
  Administered 2020-06-05: 15 ug via INTRATHECAL

## 2020-06-05 MED ORDER — NALOXONE HCL 0.4 MG/ML IJ SOLN: 0.4000 mg | INTRAMUSCULAR | Status: DC | PRN

## 2020-06-05 MED ORDER — SODIUM CHLORIDE 0.9 % IR SOLN
Status: DC | PRN
  Administered 2020-06-05: 1000 mL

## 2020-06-05 MED ORDER — ONDANSETRON HCL 4 MG/2ML IJ SOLN
INTRAMUSCULAR | Status: DC | PRN
  Administered 2020-06-05: 4 mg via INTRAVENOUS

## 2020-06-05 MED ORDER — TRANEXAMIC ACID-NACL 1000-0.7 MG/100ML-% IV SOLN
INTRAVENOUS | Status: DC | PRN
  Administered 2020-06-05: 1000 mg via INTRAVENOUS

## 2020-06-05 SURGICAL SUPPLY — 42 items
BENZOIN TINCTURE PRP APPL 2/3 (GAUZE/BANDAGES/DRESSINGS) IMPLANT
CANISTER WOUND CARE 500ML ATS (WOUND CARE) ×3 IMPLANT
CHLORAPREP W/TINT 26ML (MISCELLANEOUS) ×3 IMPLANT
CLAMP CORD UMBIL (MISCELLANEOUS) IMPLANT
CLOSURE WOUND 1/2 X4 (GAUZE/BANDAGES/DRESSINGS)
CLOTH BEACON ORANGE TIMEOUT ST (SAFETY) ×3 IMPLANT
DRESSING PREVENA PLUS CUSTOM (GAUZE/BANDAGES/DRESSINGS) ×1 IMPLANT
DRSG OPSITE POSTOP 4X10 (GAUZE/BANDAGES/DRESSINGS) ×3 IMPLANT
DRSG PREVENA PLUS CUSTOM (GAUZE/BANDAGES/DRESSINGS) ×3
ELECT REM PT RETURN 9FT ADLT (ELECTROSURGICAL) ×3
ELECTRODE REM PT RTRN 9FT ADLT (ELECTROSURGICAL) ×1 IMPLANT
EXTRACTOR VACUUM BELL STYLE (SUCTIONS) IMPLANT
GLOVE BIOGEL PI IND STRL 6.5 (GLOVE) ×1 IMPLANT
GLOVE BIOGEL PI IND STRL 7.0 (GLOVE) ×2 IMPLANT
GLOVE BIOGEL PI INDICATOR 6.5 (GLOVE) ×2
GLOVE BIOGEL PI INDICATOR 7.0 (GLOVE) ×4
GLOVE ORTHOPEDIC STR SZ6.5 (GLOVE) ×3 IMPLANT
GOWN STRL REUS W/TWL LRG LVL3 (GOWN DISPOSABLE) ×9 IMPLANT
HEMOSTAT ARISTA ABSORB 3G PWDR (HEMOSTASIS) ×3 IMPLANT
HOVERMATT SINGLE USE (MISCELLANEOUS) ×3 IMPLANT
KIT ABG SYR 3ML LUER SLIP (SYRINGE) IMPLANT
NEEDLE HYPO 22GX1.5 SAFETY (NEEDLE) ×3 IMPLANT
NEEDLE HYPO 25X1 1.5 SAFETY (NEEDLE) IMPLANT
NS IRRIG 1000ML POUR BTL (IV SOLUTION) ×3 IMPLANT
PACK C SECTION WH (CUSTOM PROCEDURE TRAY) ×3 IMPLANT
PAD OB MATERNITY 4.3X12.25 (PERSONAL CARE ITEMS) ×3 IMPLANT
PENCIL SMOKE EVAC W/HOLSTER (ELECTROSURGICAL) ×3 IMPLANT
RETRACTOR TRAXI PANNICULUS (MISCELLANEOUS) ×1 IMPLANT
RETRACTOR WND ALEXIS 25 LRG (MISCELLANEOUS) ×1 IMPLANT
RTRCTR WOUND ALEXIS 25CM LRG (MISCELLANEOUS) ×3
STRIP CLOSURE SKIN 1/2X4 (GAUZE/BANDAGES/DRESSINGS) IMPLANT
SUT MON AB 4-0 PS1 27 (SUTURE) ×3 IMPLANT
SUT PLAIN 2 0 (SUTURE) ×3
SUT PLAIN ABS 2-0 CT1 27XMFL (SUTURE) ×1 IMPLANT
SUT VIC AB 0 CT1 36 (SUTURE) ×6 IMPLANT
SUT VIC AB 0 CTX 36 (SUTURE) ×3
SUT VIC AB 0 CTX36XBRD ANBCTRL (SUTURE) ×1 IMPLANT
SYR CONTROL 10ML LL (SYRINGE) ×3 IMPLANT
TOWEL OR 17X24 6PK STRL BLUE (TOWEL DISPOSABLE) ×3 IMPLANT
TRAXI PANNICULUS RETRACTOR (MISCELLANEOUS) ×2
TRAY FOLEY W/BAG SLVR 14FR LF (SET/KITS/TRAYS/PACK) ×3 IMPLANT
WATER STERILE IRR 1000ML POUR (IV SOLUTION) ×3 IMPLANT

## 2020-06-05 NOTE — Op Note (Signed)
Carmen Price PROCEDURE DATE: 06/05/2020  PREOPERATIVE DIAGNOSES: Intrauterine pregnancy at [redacted]w[redacted]d weeks gestation; elective, failure to progress, severe pre-eclampsia  POSTOPERATIVE DIAGNOSES: The same  PROCEDURE: Primary Low Transverse Cesarean Section  SURGEON:  Baldemar Lenis, MD  ASSISTANT:  Baron Sane, MD  An experienced assistant was required given the standard of surgical care given the complexity of the case.  This assistant was needed for exposure, dissection, suctioning, retraction, instrument exchange, assisting with delivery with administration of fundal pressure, and for overall help during the procedure.  ANESTHESIOLOGY TEAM: Anesthesiologist: Lannie Fields, DO CRNA: Armanda Heritage, CRNA  INDICATIONS: Carmen Price is a 29 y.o. G1P0000 at [redacted]w[redacted]d here for cesarean section secondary to the indications listed under preoperative diagnoses; please see preoperative note for further details.  The risks of cesarean section were discussed with the patient including but were not limited to: bleeding which may require transfusion or reoperation; infection which may require antibiotics; injury to bowel, bladder, ureters or other surrounding organs; injury to the fetus; need for additional procedures including hysterectomy in the event of a life-threatening hemorrhage; placental abnormalities wth subsequent pregnancies, incisional problems, thromboembolic phenomenon and other postoperative/anesthesia complications.  She consents to blood transfusion in the event of an emergency. The patient verbalized understanding of the plan, giving informed written consent for the procedure.    FINDINGS:  Viable female infant in cephalic presentation.  Apgars and weight pending. Clear amniotic fluid.  Intact placenta, three vessel cord.  Normal uterus, fallopian tubes and ovaries bilaterally.  ANESTHESIA: Spinal INTRAVENOUS FLUIDS: 1200 ml   ESTIMATED BLOOD LOSS: 1022 ml URINE  OUTPUT:  100 ml SPECIMENS: Placenta sent to pathology COMPLICATIONS: None immediate  PROCEDURE IN DETAIL:  The patient preoperatively received intravenous antibiotics and had sequential compression devices applied to her lower extremities.  She was then taken to the operating room where spinal anesthesia was administered and was found to be adequate. She was then placed in a dorsal supine position with a leftward tilt, and a Traxi retraction device was placed to good effect on the pannus/abdomen. She was prepped and draped in a sterile manner.  A foley catheter was placed into her bladder with sterile technique and attached to constant gravity.  After a timeout was performed, a Pfannenstiel skin incision was made with scalpel and carried through to the underlying layer of fascia. The fascia was incised in the midline, and this incision was extended bilaterally using the Mayo scissors.  Kocher clamps were applied to the superior aspect of the fascial incision and the underlying rectus muscles were dissected off sharply.  A similar process was carried out on the inferior aspect of the fascial incision. The rectus muscles were separated in the midline bluntly and the peritoneum was entered bluntly. The peritoneal incision was carefully extended sharply laterally and caudad with good visualization of the bladder. The uterus appeared normal. The Alexis O-ring retractor was placed into the incision, taking care not to incorporate bowel or omentum. The bladder blade was inserted. Attention was turned to the lower uterine segment where a low transverse hysterotomy was made with a scalpel and extended bilaterally bluntly. Significant arterial bleed with hysterotomy that improved after delivery of infant. The infant was delivered from LOA position, nose and mouth were bulb suctioned, and the cord clamped and cut after 1 minute. The infant was then handed over to the waiting neonatology team. Uterine massage was then  performed, and the placenta delivered intact with a three-vessel cord. The uterus  was then cleared of clots and debris.  The hysterotomy was closed with 0 Vicryl in a running locked fashion, and an imbricating layer was also placed with 0 Vicryl. The fallopian tubes and ovaries were visualized bilaterally and normal appearing. The Alexis retractor was removed.  The pelvis was cleared of all clot and debris. Arista was placed over the hysterotomy for additional hemostasis.  Hemostasis was again confirmed on all surfaces. The fascia was then closed using 0 PDS in a running fashion.  The subcutaneous layer was irrigated, then reapproximated with 2-0 plain gut, and 20 ml of 0.5% Marcaine was injected subcutaneously around the incision.  The skin was closed with a 4-0 Monocryl subcuticular stitch. The patient tolerated the procedure well. Sponge, lap, instrument and needle counts were correct x 3.  She was taken to the recovery room in stable condition.    Baldemar Lenis, M.D. Attending Obstetrician & Gynecologist, [facility] for [employer], [facility]

## 2020-06-05 NOTE — Progress Notes (Addendum)
Labor Progress Note Carmen Price is a 29 y.o. G1P0000 at [redacted]w[redacted]d presented for IOL for gHTN on 06/03/20.    S: Doing well without complaints.  O:  BP (!) 147/93   Pulse 91   Temp 99.2 F (37.3 C) (Oral)   Resp 18   Ht 5\' 7"  (1.702 m)   Wt (!) 147.5 kg   LMP 09/12/2019   BMI 50.93 kg/m  EFM: baseline 120 bpm/ moderate variability/+ accels/ no decels  Toco: q4-5 min  SVE: Dilation: 4 Effacement (%): 50 Cervical Position: Posterior Station: Ballotable Presentation: Vertex Exam by:: [email or web address] RN     A/P: 29 y.o. G1P0000 [redacted]w[redacted]d   #IOL: s/p cytotec x8 and FB placement. Pitocin started @1830 , continue to titrate. Will consider AROM at next check.  #PEC w SF: Based on SR pressures. Mg started noon today (12/2), no other signs/symptoms. Received IV labetalol x2 (last 1140 12/2). BP well controlled. Continue to monitor. Repeat preE labs wnl.  #FWB: cat 1 #Pain: pain meds, epidural prn  #GBS pos urine, PCN, adequate ppx #BMI 51   09-23-1974, MD PGY-1

## 2020-06-05 NOTE — Transfer of Care (Signed)
Immediate Anesthesia Transfer of Care Note  Patient: Carmen Price  Procedure(s) Performed: CESAREAN SECTION (N/A )  Patient Location: PACU  Anesthesia Type:Spinal  Level of Consciousness: awake, alert  and oriented  Airway & Oxygen Therapy: Patient Spontanous Breathing  Post-op Assessment: Report given to RN and Post -op Vital signs reviewed and stable  Post vital signs: Reviewed and stable  Last Vitals:  Vitals Value Taken Time  BP    Temp    Pulse    Resp    SpO2      Last Pain:  Vitals:   06/05/20 2044  TempSrc: Oral  PainSc:       Patients Stated Pain Goal: 7 (06/04/20 0940)  Complications: No complications documented.

## 2020-06-05 NOTE — Anesthesia Preprocedure Evaluation (Signed)
Anesthesia Evaluation  Patient identified by MRN, date of birth, ID band Patient awake    Reviewed: Allergy & Precautions, Patient's Chart, lab work & pertinent test results  Airway Mallampati: II  TM Distance: >3 FB Neck ROM: Full    Dental no notable dental hx.    Pulmonary neg pulmonary ROS, former smoker,    Pulmonary exam normal breath sounds clear to auscultation       Cardiovascular hypertension (preE with SF), Normal cardiovascular exam Rhythm:Regular Rate:Normal     Neuro/Psych  Headaches, PSYCHIATRIC DISORDERS Anxiety Depression    GI/Hepatic Neg liver ROS, GERD  Controlled,  Endo/Other  Morbid obesityBMI 51  Renal/GU negative Renal ROS  negative genitourinary   Musculoskeletal negative musculoskeletal ROS (+)   Abdominal (+) + obese,   Peds negative pediatric ROS (+)  Hematology  (+) Blood dyscrasia, anemia , hct 35.7, plt 231   Anesthesia Other Findings   Reproductive/Obstetrics (+) Pregnancy G1P0 has been here since 12/1 for induction, preE with severe features on mag On maximum pit with no cervical change x 24h                             Anesthesia Physical Anesthesia Plan  ASA: III  Anesthesia Plan: Spinal   Post-op Pain Management:    Induction:   PONV Risk Score and Plan: 3 and Ondansetron, Dexamethasone and Treatment may vary due to age or medical condition  Airway Management Planned: Natural Airway and Nasal Cannula  Additional Equipment: None  Intra-op Plan:   Post-operative Plan:   Informed Consent: I have reviewed the patients History and Physical, chart, labs and discussed the procedure including the risks, benefits and alternatives for the proposed anesthesia with the patient or authorized representative who has indicated his/her understanding and acceptance.       Plan Discussed with: CRNA  Anesthesia Plan Comments:         Anesthesia  Quick Evaluation

## 2020-06-05 NOTE — Progress Notes (Signed)
Labor Progress Note Carmen Price is a 29 y.o. G1P0000 at [redacted]w[redacted]d presented for IOL for gHTN on 06/03/20.    S: Doing well without complaints.  O:  BP 132/63   Pulse 97   Temp 97.8 F (36.6 C) (Oral)   Resp 18   Ht 5\' 7"  (1.702 m)   Wt (!) 147.5 kg   LMP 09/12/2019   BMI 50.93 kg/m  EFM: baseline 130 bpm/moderate variability/+ accels, no decels  Toco: q4-5 minutes, difficulty tracing with external monitor  SVE: Dilation: 4 Effacement (%): 50 Cervical Position: Posterior Station: -2 Presentation: Vertex Exam by:: Jeanpaul Biehl    A/P: 29 y.o. G1P0000 [redacted]w[redacted]d   #IOL: IOL started 12/1 @0830 . s/p cytotec x8 and FB placement. Pitocin started @1830 , currently at 60mL/hr. Given head is now well engaged risks/benefits of AROM discussed and performed with clear fluid. IUPC placed given lack of cervical change. Will continue to monitor and consider pitocin break.  #PEC w SF: Based on SR pressures. Mg started (12/2), no other signs/symptoms. Overnight BP have been well controlled. Patient has remained asymptomatic.  #FWB: cat 1 #Pain: PRN #GBS pos urine, PCN, adequate ppx #BMI 51   , MD

## 2020-06-05 NOTE — Progress Notes (Addendum)
Labor Progress Note Carmen Price is a 29 y.o. G1P0000 at [redacted]w[redacted]d presented for IOL for gHTN on 06/03/20.    S: Patient asking for CS    O:  BP (!) 142/85   Pulse 100   Temp 98.2 F (36.8 C) (Oral)   Resp 18   Ht 5\' 7"  (1.702 m)   Wt (!) 147.5 kg   LMP 09/12/2019   BMI 50.93 kg/m  EFM: baseline 130 bpm/moderate variability/accels present/decels absent  Intermittent contractions, not adequate   SVE: Dilation: 4.5 (no change) Effacement (%): 50 Cervical Position: Anterior Station: -2 Presentation: Vertex Exam by:: Dr. [email or web address]    A/P: 29 y.o. G1P0000 [redacted]w[redacted]d here for IOL.  #IOL: IOL started 12/1 @0830 . s/p cytotec x8 and FB placement. Pitocin started @1830  on 12/2. AROM at 0740 w IUPC placed, demonstrated overall inadequate ctxs. Pit washout 1200-1400. At this time, patient unchanged since 1830 on 12/2, discussed with patient and at this time decision has been made to proceed with primary LTCS.   The risks of surgery were discussed with the patient including but were not limited to: bleeding which may require transfusion or reoperation; infection which may require antibiotics; injury to bowel, bladder, ureters or other surrounding organs; injury to the fetus; need for additional procedures including hysterectomy in the event of a life-threatening hemorrhage; formation of adhesions; placental abnormalities wth subsequent pregnancies; incisional problems; thromboembolic phenomenon and other postoperative/anesthesia complications.  The patient concurred with the proposed plan, giving informed written consent for the procedure.   Patient has been NPO since 1300 she will remain NPO for procedure. Anesthesia and OR aware. Preoperative prophylactic antibiotics and SCDs ordered on call to the OR.  To OR when ready.  #Preeclampsia: Based on SR pressures. Mg started (12/2@12 ). Blood pressures within moderate range, most recent 142/85. Asymptomatic otherwise. Continue to monitor.   -repeat CMP,  CBC, T&S   #FWB: category 1 #BMI 51     Marland Kitchen, MD [facility] Family Medicine Fellow, [facility] for [facility], [facility]  Patient for IOL for severe pre-eclampsia. Now on day 3 and remains unchanged over last 12 hours despite AROM and pitocin for 24 hrs. Requesting to proceed with CS, which I am agreeable with as she has made no progress in 12 hours s/p AROM.  The risks of cesarean section were discussed with the patient; including but not limited to: infection which may require antibiotics; bleeding which may require transfusion or re-operation; injury to bowel, bladder, ureters or other surrounding organs; need for additional procedures including hysterectomy in the event of a life-threatening hemorrhage; placental abnormalities wth subsequent pregnancies,  risk of needing c-sections in future pregnancies, incisional problems, thromboembolic phenomenon and other postoperative/anesthesia complications. Answered all questions. The patient verbalized understanding of the plan, giving informed consent for the procedure. She is agreeable to blood transfusion in the event of emergency.  Patient has been NPO, she will remain NPO for procedure Anesthesia and OR aware Preoperative prophylactic antibiotics and SCDs ordered on call to the OR  To OR when ready   [facility] MD, [facility] Attending Center for [facility])

## 2020-06-05 NOTE — Progress Notes (Signed)
Labor Progress Note Carmen Price is a 29 y.o. G1P0000 at [redacted]w[redacted]d presented for IOL for gHTN on 06/03/20.    S: Doing well without complaints.   O:  BP (!) 157/90   Pulse (!) 103   Temp 98 F (36.7 C) (Oral)   Resp 16   Ht 5\' 7"  (1.702 m)   Wt (!) 147.5 kg   LMP 09/12/2019   BMI 50.93 kg/m  EFM: baseline 125 bpm/moderate variability/+ accels, no decels  IUPC: q2-4 mvu's 140-200  SVE: Dilation: 4 Effacement (%): 50 Cervical Position: Posterior Station: -2 Presentation: Vertex Exam by:: [place]    A/P: 29 y.o. G1P0000 [redacted]w[redacted]d here for IOL for PEC w SF.   #IOL: IOL started 12/1 @0830 . s/p cytotec x8 and FB placement. Pitocin started @1830 , has been running at 30 for several hours. AROM at 0740 w IUPC placed, has demonstrated overall inadequate ctxs. Patient unchanged since 1830 on 12/2, will washout pitocin at this time and restart in a few hours.   #PEC w SF: Based on SR pressures. Mg started (12/2@12 ), no other signs/symptoms. Pressures remain MR.   #FWB: cat 1 #Pain: PRN #GBS pos urine, on PCN #BMI 51   , MD

## 2020-06-05 NOTE — Progress Notes (Signed)
Labor Progress Note Carmen Price is a 29 y.o. G1P0000 at [redacted]w[redacted]d presented for IOL for gHTN on 06/03/20.    S: Doing well without complaints.  O:  BP 116/78   Pulse 93   Temp 97.8 F (36.6 C) (Oral)   Resp 18   Ht 5\' 7"  (1.702 m)   Wt (!) 147.5 kg   LMP 09/12/2019   BMI 50.93 kg/m  EFM: baseline 130 bpm/moderate variability/+ accels, no decels  Toco: q4-5 min  SVE: Dilation: 4 Effacement (%): 50 Cervical Position: Posterior Station: Ballotable Presentation: Vertex Exam by:: [email or web address] RN     A/P: 29 y.o. G1P0000 [redacted]w[redacted]d   #IOL: s/p cytotec x8 and FB placement. Pitocin started @1830 , continue to titrate. Will consider AROM at next check.  #PEC w SF: Based on SR pressures. Mg started (12/2), no other signs/symptoms. Overnight BP have been mid range.  #FWB: cat 1 #Pain: pain meds, epidural prn  #GBS pos urine, PCN, adequate ppx #BMI 51   , MD PGY-1

## 2020-06-05 NOTE — Discharge Summary (Signed)
Postpartum Discharge Summary  Date of Service updated 06/08/2020     Patient Name: Carmen Price DOB: 01-06-1991 MRN: [id number]  Date of admission: 06/03/2020 Delivery date:06/05/2020  Delivering provider: Sloan Leiter  Date of discharge: 06/08/2020  Admitting diagnosis: Gestational hypertension [O13.9] Intrauterine pregnancy: [redacted]w[redacted]d    Secondary diagnosis:  Active Problems:   Supervision of normal first pregnancy, antepartum   Obesity during pregnancy, antepartum   Pre-eclampsia, severe   GBS bacteriuria   Cesarean delivery delivered  Additional problems: as noted above   Discharge diagnosis: Primary Cesarean delivery delivered, Failed IOL in s/o Severe Preeclampsia         Post partum procedures:Magnesium x 24 hoours Augmentation: AROM, Pitocin and Cytotec Complications: None  Hospital course: Induction of Labor With Cesarean Section   29y.o. yo G1P0000 at 352w2das admitted to the hospital 06/03/2020 for induction of labor. Patient had a labor course significant for failed IOL in setting of preeclampsia with severe features. The patient went for cesarean section due to failed IOL in s/o severe preeclampsia. Delivery details are as follows: Membrane Rupture Time/Date: 7:39 AM ,06/05/2020   Delivery Method:C-Section, Low Transverse  Details of operation can be found in separate operative Note.  Patient had an uncomplicated postpartum course. She received magnesium x 24 hours. She was started on BP medications but her BP was low. Medication was discontinued. Her BP remained stable without medications. She is ambulating, tolerating a regular diet, passing flatus, and urinating well.  Patient is discharged home in stable condition on 06/08/20.      Newborn Data: Birth date:06/05/2020  Birth time:10:06 PM  Gender:Female  Living status:Living  Apgars:8 ,8  Weight:3025 g                                 Magnesium Sulfate received: Yes: Seizure prophylaxis BMZ received:  No Rhophylac:N/A MMR:N/A T-DaP:Given prenatally Flu: No Transfusion:No  Physical exam  Vitals:   06/07/20 1544 06/07/20 2045 06/08/20 0000 06/08/20 0530  BP: (!) 106/57 (!) 118/56 122/62 124/63  Pulse: 84 71 77 73  Resp: _0 Temp: 97.8 F (36.6 C) 98 F (36.7 C) 98 F (36.7 C) 98 F (36.7 C)  TempSrc: Oral Oral Oral Oral  SpO2: 99% 100% 99% 100%  Weight:      Height:       General: alert Lochia: appropriate Uterine Fundus: firm Incision: Healing well with no significant drainage DVT Evaluation: No evidence of DVT seen on physical exam. Labs: Lab Results  Component Value Date   WBC 10.4 06/06/2020   HGB 10.2 (L) 06/06/2020   HCT 31.5 (L) 06/06/2020   MCV 94.3 06/06/2020   PLT 225 06/06/2020   CMP Latest Ref Rng & Units 06/05/2020  Glucose 70 - 99 mg/dL -  BUN 6 - 20 mg/dL -  Creatinine 0.44 - 1.00 mg/dL -  Sodium 135 - 145 mmol/L 139  Potassium 3.5 - 5.1 mmol/L 3.3(L)  Chloride 98 - 111 mmol/L -  CO2 22 - 32 mmol/L -  Calcium 8.9 - 10.3 mg/dL -  Total Protein 6.5 - 8.1 g/dL -  Total Bilirubin 0.3 - 1.2 mg/dL -  Alkaline Phos 38 - 126 U/L -  AST 15 - 41 U/L -  ALT 0 - 44 U/L -   Edinburgh Score: Edinburgh Postnatal Depression Scale Screening Tool 06/06/2020  I have been able to laugh  and see the funny side of things. 0  I have looked forward with enjoyment to things. 0  I have blamed myself unnecessarily when things went wrong. 0  I have been anxious or worried for no good reason. 0  I have felt scared or panicky for no good reason. 0  Things have been getting on top of me. 0  I have been so unhappy that I have had difficulty sleeping. 0  I have felt sad or miserable. 0  I have been so unhappy that I have been crying. 0  The thought of harming myself has occurred to me. 0  Edinburgh Postnatal Depression Scale Total 0     After visit meds:  Allergies as of 06/08/2020   No Known Allergies     Medication List    STOP taking these medications    aspirin EC 81 MG tablet   [place] Supp Sm Misc     TAKE these medications   ibuprofen 800 MG tablet Commonly known as: ADVIL Take 1 tablet (800 mg total) by mouth every 8 (eight) hours.   oxyCODONE 5 MG immediate release tablet Commonly known as: Oxy IR/ROXICODONE Take 1 tablet (5 mg total) by mouth every 6 (six) hours as needed for moderate pain.   Vitafol Ultra 29-0.6-0.4-200 MG Caps Take 1 tablet by mouth daily.            Discharge Care Instructions  (From admission, onward)         Start     Ordered   06/08/20 0000  Leave dressing on - Keep it clean, dry, and intact until clinic visit        06/08/20 0731           Discharge home in stable condition Infant Feeding: No evidence of DVT seen on physical exam. Infant Disposition:home with mother Discharge instruction: per After Visit Summary and Postpartum booklet. Activity: Advance as tolerated. Pelvic rest for 6 weeks.  Diet: routine diet Future Appointments:No future appointments. Follow up Visit:  [place]. Schedule an appointment as soon as possible for a visit in 1 week(s).   Why: For BP check and removal of Prevena Contact information: [address] [place] [id number] [phone]             Message sent to [facility] on 06/05/20.  Please schedule this patient for a In person postpartum visit in 4 weeks with the following provider: Any provider. Additional Postpartum F/U:Incision check 1 week and BP check 1 week  High risk pregnancy complicated by: severe preeclampsia, primary cesarean secondary to failed IOL, BMI 51 (provena wound vac placed) Delivery mode:  C-Section, Low Transverse  Anticipated Birth Control:  Unsure  06/08/2020 Chancy Milroy, MD

## 2020-06-05 NOTE — Anesthesia Procedure Notes (Signed)
Spinal  Patient location during procedure: OR Start time: 06/05/2020 9:31 PM End time: 06/05/2020 9:35 PM Staffing Performed: anesthesiologist  Anesthesiologist: Lannie Fields, DO Preanesthetic Checklist Completed: patient identified, IV checked, risks and benefits discussed, surgical consent, monitors and equipment checked, pre-op evaluation and timeout performed Spinal Block Patient position: sitting Prep: DuraPrep and site prepped and draped Patient monitoring: cardiac monitor, continuous pulse ox and blood pressure Approach: midline Location: L3-4 Injection technique: single-shot Needle Needle type: Pencan  Needle gauge: 24 G Needle length: 9 cm Assessment Sensory level: T6 Additional Notes Functioning IV was confirmed and monitors were applied. Sterile prep and drape, including hand hygiene and sterile gloves were used. The patient was positioned and the spine was prepped. The skin was anesthetized with lidocaine.  Free flow of clear CSF was obtained prior to injecting local anesthetic into the CSF.  The spinal needle aspirated freely following injection.  The needle was carefully withdrawn.  The patient tolerated the procedure well.

## 2020-06-05 NOTE — Discharge Instructions (Signed)

## 2020-06-06 ENCOUNTER — Encounter (HOSPITAL_COMMUNITY): Payer: Self-pay | Admitting: Obstetrics and Gynecology

## 2020-06-06 LAB — CBC
HCT: 31.5 % — ABNORMAL LOW (ref 36.0–46.0)
Hemoglobin: 10.2 g/dL — ABNORMAL LOW (ref 12.0–15.0)
MCH: 30.5 pg (ref 26.0–34.0)
MCHC: 32.4 g/dL (ref 30.0–36.0)
MCV: 94.3 fL (ref 80.0–100.0)
Platelets: 225 10*3/uL (ref 150–400)
RBC: 3.34 MIL/uL — ABNORMAL LOW (ref 3.87–5.11)
RDW: 13.2 % (ref 11.5–15.5)
WBC: 10.4 10*3/uL (ref 4.0–10.5)
nRBC: 0 % (ref 0.0–0.2)

## 2020-06-06 MED ORDER — MENTHOL 3 MG MT LOZG: 1.0000 | LOZENGE | OROMUCOSAL | Status: DC | PRN

## 2020-06-06 MED ORDER — WITCH HAZEL-GLYCERIN EX PADS: 1.0000 "application " | MEDICATED_PAD | CUTANEOUS | Status: DC | PRN

## 2020-06-06 MED ORDER — OXYCODONE HCL 5 MG PO TABS: 5.0000 mg | ORAL_TABLET | ORAL | Status: DC | PRN

## 2020-06-06 MED ORDER — SENNOSIDES-DOCUSATE SODIUM 8.6-50 MG PO TABS
2.0000 | ORAL_TABLET | ORAL | Status: DC
  Administered 2020-06-06: 2 via ORAL
  Filled 2020-06-06 (×2): qty 2

## 2020-06-06 MED ORDER — PRENATAL MULTIVITAMIN CH
1.0000 | ORAL_TABLET | Freq: Every day | ORAL | Status: DC
  Administered 2020-06-06: 1 via ORAL
  Filled 2020-06-06: qty 1

## 2020-06-06 MED ORDER — OXYTOCIN-SODIUM CHLORIDE 30-0.9 UT/500ML-% IV SOLN: 2.5000 [IU]/h | INTRAVENOUS | Status: AC

## 2020-06-06 MED ORDER — ENOXAPARIN SODIUM 80 MG/0.8ML ~~LOC~~ SOLN
0.5000 mg/kg | SUBCUTANEOUS | Status: DC
  Administered 2020-06-06 – 2020-06-07 (×2): 75 mg via SUBCUTANEOUS
  Filled 2020-06-06 (×2): qty 0.8

## 2020-06-06 MED ORDER — DIBUCAINE (PERIANAL) 1 % EX OINT: 1.0000 "application " | TOPICAL_OINTMENT | CUTANEOUS | Status: DC | PRN

## 2020-06-06 MED ORDER — MAGNESIUM SULFATE 40 GM/1000ML IV SOLN
2.0000 g/h | INTRAVENOUS | Status: AC
  Administered 2020-06-06 (×2): 2 g/h via INTRAVENOUS
  Filled 2020-06-06: qty 1000

## 2020-06-06 MED ORDER — AMLODIPINE BESYLATE 5 MG PO TABS
5.0000 mg | ORAL_TABLET | Freq: Every day | ORAL | Status: DC
  Filled 2020-06-06: qty 1

## 2020-06-06 MED ORDER — SIMETHICONE 80 MG PO CHEW: 80.0000 mg | CHEWABLE_TABLET | ORAL | Status: DC | PRN

## 2020-06-06 MED ORDER — TETANUS-DIPHTH-ACELL PERTUSSIS 5-2.5-18.5 LF-MCG/0.5 IM SUSY: 0.5000 mL | PREFILLED_SYRINGE | Freq: Once | INTRAMUSCULAR | Status: DC

## 2020-06-06 MED ORDER — SIMETHICONE 80 MG PO CHEW
80.0000 mg | CHEWABLE_TABLET | Freq: Three times a day (TID) | ORAL | Status: DC
  Administered 2020-06-06 – 2020-06-07 (×4): 80 mg via ORAL
  Filled 2020-06-06 (×5): qty 1

## 2020-06-06 MED ORDER — DIPHENHYDRAMINE HCL 25 MG PO CAPS: 25.0000 mg | ORAL_CAPSULE | Freq: Four times a day (QID) | ORAL | Status: DC | PRN

## 2020-06-06 MED ORDER — SIMETHICONE 80 MG PO CHEW
80.0000 mg | CHEWABLE_TABLET | ORAL | Status: DC
  Administered 2020-06-06 – 2020-06-07 (×3): 80 mg via ORAL
  Filled 2020-06-06 (×3): qty 1

## 2020-06-06 MED ORDER — LACTATED RINGERS IV SOLN: INTRAVENOUS | Status: DC

## 2020-06-06 MED ORDER — ACETAMINOPHEN 500 MG PO TABS
1000.0000 mg | ORAL_TABLET | Freq: Four times a day (QID) | ORAL | Status: DC
  Administered 2020-06-06 – 2020-06-08 (×9): 1000 mg via ORAL
  Filled 2020-06-06 (×9): qty 2

## 2020-06-06 MED ORDER — COCONUT OIL OIL: 1.0000 "application " | TOPICAL_OIL | Status: DC | PRN

## 2020-06-06 MED ORDER — KETOROLAC TROMETHAMINE 30 MG/ML IJ SOLN
INTRAMUSCULAR | Status: AC
  Filled 2020-06-06: qty 1

## 2020-06-06 MED ORDER — IBUPROFEN 800 MG PO TABS
800.0000 mg | ORAL_TABLET | Freq: Three times a day (TID) | ORAL | Status: DC
  Administered 2020-06-06 – 2020-06-08 (×7): 800 mg via ORAL
  Filled 2020-06-06 (×7): qty 1

## 2020-06-06 NOTE — Anesthesia Postprocedure Evaluation (Signed)
Anesthesia Post Note  Patient: Carmen Price  Procedure(s) Performed: CESAREAN SECTION (N/A )     Patient location during evaluation: PACU Anesthesia Type: Spinal Level of consciousness: awake and alert and oriented Pain management: pain level controlled Vital Signs Assessment: post-procedure vital signs reviewed and stable Respiratory status: spontaneous breathing, nonlabored ventilation and respiratory function stable Cardiovascular status: blood pressure returned to baseline and stable Postop Assessment: no headache, no backache, spinal receding, patient able to bend at knees and no apparent nausea or vomiting Anesthetic complications: no   No complications documented.  Last Vitals:  Vitals:   06/06/20 0411 06/06/20 0414  BP: 92/70 102/66  Pulse: (!) 104 (!) 106  Resp:    Temp:    SpO2:      Last Pain:  Vitals:   06/06/20 0300  TempSrc:   PainSc: 5    Pain Goal: Patients Stated Pain Goal: 2 (06/06/20 0030)                 Lannie Fields

## 2020-06-06 NOTE — Progress Notes (Signed)
Faculty Attending Note  Post Op Day 1  Subjective: Patient is feeling well. She reports moderately well controlled pain on PO pain meds. She is ambulating and denies light-headedness or dizziness. She is voiding spontaneously. She is passing flatus, has not had a BM yet. She is tolerating ice without nausea/vomiting. Bleeding is moderate. She is breast & bottle feeding. Baby is in room and doing well.  Objective: Blood pressure (!) 96/52, pulse 66, temperature (!) 97.4 F (36.3 C), temperature source Oral, resp. rate 20, height 5\' 7"  (1.702 m), weight (!) 147.5 kg, last menstrual period 09/12/2019, SpO2 100 %. Temp:  [97.4 F (36.3 C)-99.4 F (37.4 C)] 97.4 F (36.3 C) (12/04 0800) Pulse Rate:  [66-114] 66 (12/04 0800) Resp:  [16-22] 20 (12/04 0403) BP: (92-164)/(49-101) 96/52 (12/04 0800) SpO2:  [96 %-100 %] 100 % (12/04 0800)  Physical Exam:  General: alert, oriented, cooperative Chest: normal respiratory effort Heart: RRR  Abdomen: soft, appropriately tender to palpation, incision covered by dressing with no evidence of active bleeding  Uterine Fundus: firm, 2 fingers below the umbilicus Lochia: moderate, rubra DVT Evaluation: no evidence of DVT Extremities: no edema, no calf tenderness  UOP: voiding spontaneously  Recent Labs    06/05/20 2241 06/06/20 0348  HGB 13.6 10.2*  HCT 40.0 31.5*    Assessment/Plan: Patient Active Problem List   Diagnosis Date Noted  . Cesarean delivery delivered 06/05/2020  . GBS bacteriuria 05/15/2020  . Pre-eclampsia, severe 05/12/2020  . Pregnancy headache in third trimester 05/12/2020  . Obesity during pregnancy, antepartum 11/28/2019  . Supervision of normal first pregnancy, antepartum 11/21/2019  . ACNE VULGARIS, FACIAL 03/13/2007    Patient is 29 y.o. G1P0000 POD#1 s/p 1LTCS at [redacted]w[redacted]d for failure to progress. Course complicated by severe pre-eclampsia. She is doing very well, recovering appropriately and complains only of mild  pain. Mag on until 24 hrs post partum, restarted on norvasc 5.   Continue routine post partum care Pain meds prn Regular diet Lovenox 40 mg daily  POP for birth control Magnesium until 10 pm tonight   [redacted]w[redacted]d, M.D. Attending Center for [facility] (Faculty Practice)  06/06/2020, 8:42 AM

## 2020-06-06 NOTE — Lactation Note (Signed)
This note was copied from a baby's chart. Lactation Consultation Note  Patient Name: Carmen Price Date: 06/06/2020 Reason for consult: Initial assessment   LC Initial Visit:  Mother informed me that she desires to formula feed only; no lc services will be needed.  RN updated.   Maternal Data    Feeding    LATCH Score                   Interventions    Lactation Tools Discussed/Used     Consult Status Consult Status: Complete    Carmen Price 06/06/2020, 3:13 PM

## 2020-06-07 NOTE — Progress Notes (Signed)
Daily Postpartum Note  Admission Date: 06/03/2020 Current Date: 06/07/2020 8:07 AM  Carmen Price is a 29 y.o. G1P1001 POD#2 pLTCS for arrest of dilation @ [redacted]w[redacted]d by, admitted for IOL gHTN  Pregnancy complicated by: Patient Active Problem List   Diagnosis Date Noted  . Cesarean delivery delivered 06/05/2020  . GBS bacteriuria 05/15/2020  . Pre-eclampsia, severe 05/12/2020  . Pregnancy headache in third trimester 05/12/2020  . Obesity during pregnancy, antepartum 11/28/2019  . Supervision of normal first pregnancy, antepartum 11/21/2019  . ACNE VULGARIS, FACIAL 03/13/2007    Overnight/24hr events:  none  Subjective:  No s/s of pre-eclampsia. Pt meeting all PP/post op goals  Objective:    Current Vital Signs 24h Vital Sign Ranges  T 98.1 F (36.7 C) Temp  Avg: 97.8 F (36.6 C)  Min: 97.6 F (36.4 C)  Max: 98.1 F (36.7 C)  BP 115/66 BP  Min: 95/44  Max: 126/53  HR 81 Pulse  Avg: 78.5  Min: 71  Max: 85  RR 18 Resp  Avg: 18.6  Min: 18  Max: 20  SaO2 100 % Room Air SpO2  Avg: 98 %  Min: 97 %  Max: 100 %       24 Hour I/O Current Shift I/O  Time Ins Outs 12/04 0701 - 12/05 0700 In: 4053.6 [P.O.:1902; I.V.:2151.6] Out: 1050 [Urine:1050] No intake/output data recorded.   Patient Vitals for the past 24 hrs:  BP Temp Temp src Pulse Resp SpO2  06/07/20 0800 115/66 98.1 F (36.7 C) Oral 81 18 100 %  06/07/20 0303 (!) 115/55 97.6 F (36.4 C) Oral 71 20 97 %  06/06/20 2311 101/61 97.6 F (36.4 C) Oral 77 19 97 %  06/06/20 1922 (!) 109/58 97.9 F (36.6 C) Oral 85 18 98 %  06/06/20 1510 (!) 95/44 97.7 F (36.5 C) Oral 79 18 99 %  06/06/20 1410 -- -- -- -- 19 --  06/06/20 1300 -- -- -- -- 18 --  06/06/20 1122 (!) 126/53 97.8 F (36.6 C) Oral 78 18 97 %  06/06/20 1053 -- -- -- -- 19 --  06/06/20 0900 -- -- -- -- 19 --    Physical exam: General: Well nourished, well developed female in no acute distress. Abdomen: obese, +bs, soft, nttp, nd. prevena in place and  working well Cardiovascular: S1, S2 normal, no murmur, rub or gallop, regular rate and rhythm Respiratory: CTAB Extremities: no clubbing, cyanosis or edema Skin: Warm and dry.   Medications: Current Facility-Administered Medications  Medication Dose Route Frequency Provider Last Rate Last Admin  . acetaminophen (TYLENOL) tablet 1,000 mg  1,000 mg Oral Q6H Sheila Oats, MD   1,000 mg at 06/07/20 0544  . amLODipine (NORVASC) tablet 5 mg  5 mg Oral Daily Goswick, Skipper Cliche, MD      . coconut oil  1 application Topical PRN Sheila Oats, MD      . witch hazel-glycerin (TUCKS) pad 1 application  1 application Topical PRN Goswick, Skipper Cliche, MD       And  . dibucaine (NUPERCAINAL) 1 % rectal ointment 1 application  1 application Rectal PRN Sheila Oats, MD      . diphenhydrAMINE (BENADRYL) injection 12.5 mg  12.5 mg Intravenous Q4H PRN Lannie Fields, DO   12.5 mg at 06/05/20 2355   Or  . diphenhydrAMINE (BENADRYL) capsule 25 mg  25 mg Oral Q4H PRN Lacy Duverney M, DO      . diphenhydrAMINE (BENADRYL)  capsule 25 mg  25 mg Oral Q6H PRN Sheila Oats, MD      . enoxaparin (LOVENOX) injection 75 mg  0.5 mg/kg Subcutaneous Q24H Sheila Oats, MD   75 mg at 06/06/20 1412  . labetalol (NORMODYNE) injection 20 mg  20 mg Intravenous PRN Sheila Oats, MD   20 mg at 06/05/20 2109   And  . labetalol (NORMODYNE) injection 40 mg  40 mg Intravenous PRN Sheila Oats, MD       And  . labetalol (NORMODYNE) injection 80 mg  80 mg Intravenous PRN Sheila Oats, MD       And  . hydrALAZINE (APRESOLINE) injection 10 mg  10 mg Intravenous PRN Sheila Oats, MD      . ibuprofen (ADVIL) tablet 800 mg  800 mg Oral Q8H Sheila Oats, MD   800 mg at 06/07/20 0544  . lactated ringers infusion   Intravenous Continuous Conan Bowens, MD   Stopped at 06/06/20 2307  . menthol-cetylpyridinium (CEPACOL) lozenge 3 mg  1 lozenge Oral Q2H PRN Sheila Oats, MD      . nalbuphine (NUBAIN)  injection 5 mg  5 mg Intravenous Q4H PRN Lannie Fields, DO       Or  . nalbuphine (NUBAIN) injection 5 mg  5 mg Subcutaneous Q4H PRN Lacy Duverney M, DO      . nalbuphine (NUBAIN) injection 5 mg  5 mg Intravenous Once PRN Lacy Duverney M, DO       Or  . nalbuphine (NUBAIN) injection 5 mg  5 mg Subcutaneous Once PRN Lacy Duverney M, DO      . naloxone [facility]) injection 0.4 mg  0.4 mg Intravenous PRN Lacy Duverney M, DO       And  . sodium chloride flush (NS) 0.9 % injection 3 mL  3 mL Intravenous PRN Lacy Duverney M, DO      . naloxone HCl (NARCAN) 2 mg in dextrose 5 % 250 mL infusion  1-4 mcg/kg/hr Intravenous Continuous PRN Lacy Duverney M, DO      . ondansetron [facility]) injection 4 mg  4 mg Intravenous Q8H PRN Lacy Duverney M, DO      . oxyCODONE (Oxy IR/ROXICODONE) immediate release tablet 5-10 mg  5-10 mg Oral Q4H PRN Sheila Oats, MD      . prenatal multivitamin tablet 1 tablet  1 tablet Oral Q1200 Sheila Oats, MD   1 tablet at 06/06/20 1146  . scopolamine (TRANSDERM-SCOP) 1 MG/3DAYS 1.5 mg  1 patch Transdermal Once Lannie Fields, DO   1.5 mg at 06/06/20 0239  . senna-docusate (Senokot-S) tablet 2 tablet  2 tablet Oral Q24H Sheila Oats, MD   2 tablet at 06/06/20 0307  . simethicone (MYLICON) chewable tablet 80 mg  80 mg Oral TID PC Sheila Oats, MD   80 mg at 06/06/20 1745  . simethicone (MYLICON) chewable tablet 80 mg  80 mg Oral Q24H Sheila Oats, MD   80 mg at 06/06/20 2312  . simethicone (MYLICON) chewable tablet 80 mg  80 mg Oral PRN Sheila Oats, MD      . Tdap Leda Min) injection 0.5 mL  0.5 mL Intramuscular Once Sheila Oats, MD        Labs:  Recent Labs  Lab 06/04/20 2010 06/04/20 2010 06/05/20 1944 06/05/20 2241 06/06/20 0348  WBC 6.8  --  8.0  --  10.4  HGB 11.2*   < >  11.6* 13.6 10.2*  HCT 34.0*   < > 35.7* 40.0 31.5*  PLT 224  --  231  --  225   < > = values in this interval not  displayed.    Recent Labs  Lab 06/03/20 0822 06/03/20 0822 06/04/20 2010 06/05/20 1944 06/05/20 2241  NA 135   < > 136 136 139  K 3.4*   < > 3.4* 3.4* 3.3*  CL 103  --  106 104  --   CO2 22  --  20* 20*  --   BUN <5*  --  <5* <5*  --   CREATININE 0.45  --  0.41* 0.44  --   CALCIUM 8.9  --  8.2* 7.5*  --   PROT 6.7  --  6.3* 6.5  --   BILITOT 0.4  --  0.6 1.0  --   ALKPHOS 46  --  48 60  --   ALT 13  --  13 13  --   AST 16  --  13* 17  --   GLUCOSE 79  --  83 101*  --    < > = values in this interval not displayed.     Radiology:  No new imaging  Assessment & Plan:  Pt doing well *PP: routine care. O POS. No circ. Breast and formula *Severe pre-eclampsia: watch bps. May need to d/c norvasc *PPx: lovenox.  *FEN/GI: regular diet. sliv *Dispo: tomorrow. Needs 1wk prevena removal and bp check  Cornelia Copa MD Attending Center for [facility]) GYN Consult Phone: [phone] (M-F, 0800-1700) & [phone] (Off hours, weekends, holidays)

## 2020-06-08 ENCOUNTER — Encounter (HOSPITAL_COMMUNITY): Payer: Self-pay | Admitting: Obstetrics and Gynecology

## 2020-06-08 LAB — TYPE AND SCREEN
ABO/RH(D): O POS
Antibody Screen: NEGATIVE
Unit division: 0
Unit division: 0
Unit division: 0
Unit division: 0

## 2020-06-08 LAB — BPAM RBC
Blood Product Expiration Date: 202201042359
Blood Product Expiration Date: 202201042359
Blood Product Expiration Date: 202201042359
Blood Product Expiration Date: 202201052359
ISSUE DATE / TIME: 202112041932
ISSUE DATE / TIME: 202112042048
ISSUE DATE / TIME: 202112051040
ISSUE DATE / TIME: 202112051107
Unit Type and Rh: 5100
Unit Type and Rh: 5100
Unit Type and Rh: 5100
Unit Type and Rh: 5100

## 2020-06-08 MED ORDER — IBUPROFEN 800 MG PO TABS: 800.0000 mg | ORAL_TABLET | Freq: Three times a day (TID) | ORAL | 1 refills | Status: DC

## 2020-06-08 MED ORDER — OXYCODONE HCL 5 MG PO TABS: 5.0000 mg | ORAL_TABLET | Freq: Four times a day (QID) | ORAL | 0 refills | Status: DC | PRN

## 2020-06-08 NOTE — Plan of Care (Signed)
Pt to be discharged home with printed instructions. No concerns noted. Sai Zinn L Jacy Brocker, RN  

## 2020-06-08 NOTE — Progress Notes (Signed)
Suction device switched over to compact device. Suction intact. Carmelina Dane, RN

## 2020-06-09 LAB — SURGICAL PATHOLOGY

## 2020-06-12 ENCOUNTER — Other Ambulatory Visit: Payer: Self-pay

## 2020-06-12 ENCOUNTER — Ambulatory Visit (INDEPENDENT_AMBULATORY_CARE_PROVIDER_SITE_OTHER): Payer: Medicaid Other

## 2020-06-12 DIAGNOSIS — Z4889 Encounter for other specified surgical aftercare: Secondary | ICD-10-CM

## 2020-06-12 NOTE — Progress Notes (Signed)
Subjective:     Carmen Price is a 29 y.o. female who presents to the clinic 1 weeks status post LTCS for Incision check and BP Check. Eating a regular diet without difficulty. Bowel movements are normal. The patient is not having any pain.  Review of Systems Pertinent items are noted in HPI.    Objective:    BP 131/86   Pulse (!) 112   Wt (!) 316 lb (143.3 kg)   LMP 09/12/2019   Breastfeeding No   BMI 49.49 kg/m  General:  alert  Abdomen: soft, bowel sounds active, non-tender  Incision:   healing well, no drainage, no erythema, no hernia, no seroma, no swelling, no dehiscence, incision well approximated     Assessment:    Doing well postoperatively.  BP is controlled in Office today   Plan:    1. Continue any current medications. 2. Wound care discussed & wound Vac     removed . 3. Activity restrictions: none 4. Anticipated return to work: not applicable. 5. Follow up: 3 weeks for PP visit.   Maretta Bees, RMA

## 2020-06-15 NOTE — Progress Notes (Signed)
Agree with A & P. 

## 2020-07-02 ENCOUNTER — Ambulatory Visit (INDEPENDENT_AMBULATORY_CARE_PROVIDER_SITE_OTHER): Payer: Medicaid Other | Admitting: Obstetrics & Gynecology

## 2020-07-02 ENCOUNTER — Other Ambulatory Visit: Payer: Self-pay

## 2020-07-02 DIAGNOSIS — Z98891 History of uterine scar from previous surgery: Secondary | ICD-10-CM

## 2020-07-02 MED ORDER — NORGESTIMATE-ETH ESTRADIOL 0.25-35 MG-MCG PO TABS: 1.0000 | ORAL_TABLET | Freq: Every day | ORAL | 11 refills | Status: DC

## 2020-07-02 NOTE — Progress Notes (Signed)
    Post Partum Visit Note  Carmen Price is a 29 y.o. G50P1001 female who presents for a postpartum visit. She is 4 weeks postpartum following a primary cesarean section.  I have fully reviewed the prenatal and intrapartum course. The delivery was at 37.2 gestational weeks.  Anesthesia: spinal. Postpartum course has been doing well. Baby is doing well. Baby is feeding by bottle Rush Barer. Bleeding no bleeding. Bowel function is normal. Bladder function is normal. Patient is not sexually active. Contraception method is abstinence. Interested in Pill.  Postpartum depression screening: negative, score 0.   The pregnancy intention screening data noted above was reviewed. Potential methods of contraception were discussed. The patient elected to proceed with Oral Contraceptive.      The following portions of the patient's history were reviewed and updated as appropriate: allergies, current medications, past family history, past medical history, past social history, past surgical history and problem list.  Review of Systems Pertinent items are noted in HPI.    Objective:  LMP 09/12/2019   General:  alert, cooperative and no distress           Abdomen: obese not tender incision healing well   Vulva:  not evaluated  Vagina: not evaluated     Corpus: not examined  Adnexa:  not evaluated  Rectal Exam: Not performed.        Assessment:    normal postpartum exam. Pap smear not done at today's visit.   Plan:   Essential components of care per ACOG recommendations:  1.  Mood and well being: Patient with negative depression screening today. Reviewed local resources for support.  - Patient does not use tobacco. If using tobacco we discussed reduction and for recently cessation risk of relapse - hx of drug use? No   2. Infant care and feeding:  -Patient currently breastmilk feeding? No  -Social determinants of health (SDOH) reviewed in EPIC. No concerns  3. Sexuality, contraception  and birth spacing - Patient does not want a pregnancy in the next year.  Desired family size is 2 children.  - Reviewed forms of contraception in tiered fashion. Patient desired oral contraceptives (estrogen/progesterone) today.   - Discussed birth spacing of 18 months  4. Sleep and fatigue -Encouraged family/partner/community support of 4 hrs of uninterrupted sleep to help with mood and fatigue  5. Physical Recovery  - Discussed patients delivery and complications  - Patient has urinary incontinence? No  - Patient is safe to resume physical and sexual activity  6.  Health Maintenance - Last pap smear done 11/2019 and was normal with negative HPV.  Adam Phenix, MD Center for [facility], [facility]

## 2020-07-02 NOTE — Patient Instructions (Signed)
Cesarean Delivery, Care After This sheet gives you information about how to care for yourself after your procedure. Your health care provider may also give you more specific instructions. If you have problems or questions, contact your health care provider. What can I expect after the procedure? After the procedure, it is common to have:  A small amount of blood or clear fluid coming from the incision.  Some redness, swelling, and pain in your incision area.  Some abdominal pain and soreness.  Vaginal bleeding (lochia). Even though you did not have a vaginal delivery, you will still have vaginal bleeding and discharge.  Pelvic cramps.  Fatigue. You may have pain, swelling, and discomfort in the tissue between your vagina and your anus (perineum) if:  Your C-section was unplanned, and you were allowed to labor and push.  An incision was made in the area (episiotomy) or the tissue tore during attempted vaginal delivery. Follow these instructions at home: Incision care   Follow instructions from your health care provider about how to take care of your incision. Make sure you: ? Wash your hands with soap and water before you change your bandage (dressing). If soap and water are not available, use hand sanitizer. ? If you have a dressing, change it or remove it as told by your health care provider. ? Leave stitches (sutures), skin staples, skin glue, or adhesive strips in place. These skin closures may need to stay in place for 2 weeks or longer. If adhesive strip edges start to loosen and curl up, you may trim the loose edges. Do not remove adhesive strips completely unless your health care provider tells you to do that.  Check your incision area every day for signs of infection. Check for: ? More redness, swelling, or pain. ? More fluid or blood. ? Warmth. ? Pus or a bad smell.  Do not take baths, swim, or use a hot tub until your health care provider says it's okay. Ask your health  care provider if you can take showers.  When you cough or sneeze, hug a pillow. This helps with pain and decreases the chance of your incision opening up (dehiscing). Do this until your incision heals. Medicines  Take over-the-counter and prescription medicines only as told by your health care provider.  If you were prescribed an antibiotic medicine, take it as told by your health care provider. Do not stop taking the antibiotic even if you start to feel better.  Do not drive or use heavy machinery while taking prescription pain medicine. Lifestyle  Do not drink alcohol. This is especially important if you are breastfeeding or taking pain medicine.  Do not use any products that contain nicotine or tobacco, such as cigarettes, e-cigarettes, and chewing tobacco. If you need help quitting, ask your health care provider. Eating and drinking  Drink at least 8 eight-ounce glasses of water every day unless told not to by your health care provider. If you breastfeed, you may need to drink even more water.  Eat high-fiber foods every day. These foods may help prevent or relieve constipation. High-fiber foods include: ? Whole grain cereals and breads. ? Brown rice. ? Beans. ? Fresh fruits and vegetables. Activity   If possible, have someone help you care for your baby and help with household activities for at least a few days after you leave the hospital.  Return to your normal activities as told by your health care provider. Ask your health care provider what activities are safe for   you.  Rest as much as possible. Try to rest or take a nap while your baby is sleeping.  Do not lift anything that is heavier than 10 lbs (4.5 kg), or the limit that you were told, until your health care provider says that it is safe.  Talk with your health care provider about when you can engage in sexual activity. This may depend on your: ? Risk of infection. ? How fast you heal. ? Comfort and desire to  engage in sexual activity. General instructions  Do not use tampons or douches until your health care provider approves.  Wear loose, comfortable clothing and a supportive and well-fitting bra.  Keep your perineum clean and dry. Wipe from front to back when you use the toilet.  If you pass a blood clot, save it and call your health care provider to discuss. Do not flush blood clots down the toilet before you get instructions from your health care provider.  Keep all follow-up visits for you and your baby as told by your health care provider. This is important. Contact a health care provider if:  You have: ? A fever. ? Bad-smelling vaginal discharge. ? Pus or a bad smell coming from your incision. ? Difficulty or pain when urinating. ? A sudden increase or decrease in the frequency of your bowel movements. ? More redness, swelling, or pain around your incision. ? More fluid or blood coming from your incision. ? A rash. ? Nausea. ? Little or no interest in activities you used to enjoy. ? Questions about caring for yourself or your baby.  Your incision feels warm to the touch.  Your breasts turn red or become painful or hard.  You feel unusually sad or worried.  You vomit.  You pass a blood clot from your vagina.  You urinate more than usual.  You are dizzy or light-headed. Get help right away if:  You have: ? Pain that does not go away or get better with medicine. ? Chest pain. ? Difficulty breathing. ? Blurred vision or spots in your vision. ? Thoughts about hurting yourself or your baby. ? New pain in your abdomen or in one of your legs. ? A severe headache.  You faint.  You bleed from your vagina so much that you fill more than one sanitary pad in one hour. Bleeding should not be heavier than your heaviest period. Summary  After the procedure, it is common to have pain at your incision site, abdominal cramping, and slight bleeding from your vagina.  Check  your incision area every day for signs of infection.  Tell your health care provider about any unusual symptoms.  Keep all follow-up visits for you and your baby as told by your health care provider. This information is not intended to replace advice given to you by your health care provider. Make sure you discuss any questions you have with your health care provider. Document Revised: 12/27/2017 Document Reviewed: 12/27/2017 Elsevier Patient Education  2020 [employer]  

## 2020-07-04 HISTORY — DX: Maternal care for unspecified type scar from previous cesarean delivery: O34.219

## 2020-11-19 ENCOUNTER — Other Ambulatory Visit: Payer: Self-pay

## 2020-11-19 ENCOUNTER — Ambulatory Visit (INDEPENDENT_AMBULATORY_CARE_PROVIDER_SITE_OTHER): Payer: Medicaid Other

## 2020-11-19 VITALS — BP 135/83 | HR 95

## 2020-11-19 DIAGNOSIS — Z3200 Encounter for pregnancy test, result unknown: Secondary | ICD-10-CM

## 2020-11-19 DIAGNOSIS — Z3201 Encounter for pregnancy test, result positive: Secondary | ICD-10-CM | POA: Diagnosis not present

## 2020-11-19 LAB — POCT URINE PREGNANCY: Preg Test, Ur: POSITIVE — AB

## 2020-11-19 NOTE — Progress Notes (Addendum)
Ms. Colcord presents today for UPT. She does not have any pregnancy complaints.  LMP:09/24/2020    OBJECTIVE: Appears well, in no apparent distress.  OB History    Gravida  1   Para  1   Term  1   Preterm  0   AB  0   Living  1     SAB  0   IAB  0   Ectopic  0   Multiple  0   Live Births  1          Home UPT Result:Positive  In-Office UPT result:Positive  I have reviewed the patient's medical, obstetrical, social, and family histories, and medications.   ASSESSMENT: Positive pregnancy test  PLAN Prenatal care to be completed at: She will start care around 10-12 weeks.

## 2020-11-25 ENCOUNTER — Other Ambulatory Visit: Payer: Self-pay

## 2020-11-25 ENCOUNTER — Other Ambulatory Visit: Payer: Self-pay | Admitting: Obstetrics

## 2020-11-25 DIAGNOSIS — O219 Vomiting of pregnancy, unspecified: Secondary | ICD-10-CM

## 2020-11-25 MED ORDER — PROMETHAZINE HCL 25 MG PO TABS: 25.0000 mg | ORAL_TABLET | Freq: Four times a day (QID) | ORAL | 0 refills | Status: DC | PRN

## 2020-11-25 NOTE — Progress Notes (Signed)
Return call to pt regarding Rx request for Nausea + pregnancy test confirmed here in the office.  Rx sent per protocol Pt voiced understanding.

## 2020-11-29 ENCOUNTER — Inpatient Hospital Stay (HOSPITAL_COMMUNITY)
Admission: AD | Admit: 2020-11-29 | Discharge: 2020-11-29 | Disposition: A | Discharge status: Home / Self Care | Payer: Medicaid Other | Attending: Obstetrics and Gynecology | Admitting: Obstetrics and Gynecology

## 2020-11-29 ENCOUNTER — Encounter (HOSPITAL_COMMUNITY): Payer: Self-pay | Admitting: Obstetrics and Gynecology

## 2020-11-29 ENCOUNTER — Inpatient Hospital Stay (HOSPITAL_COMMUNITY): Payer: Medicaid Other

## 2020-11-29 ENCOUNTER — Other Ambulatory Visit: Payer: Self-pay

## 2020-11-29 DIAGNOSIS — O209 Hemorrhage in early pregnancy, unspecified: Secondary | ICD-10-CM | POA: Insufficient documentation

## 2020-11-29 DIAGNOSIS — O26891 Other specified pregnancy related conditions, first trimester: Secondary | ICD-10-CM | POA: Diagnosis not present

## 2020-11-29 DIAGNOSIS — O99611 Diseases of the digestive system complicating pregnancy, first trimester: Secondary | ICD-10-CM | POA: Insufficient documentation

## 2020-11-29 DIAGNOSIS — Z87891 Personal history of nicotine dependence: Secondary | ICD-10-CM | POA: Insufficient documentation

## 2020-11-29 DIAGNOSIS — Z79899 Other long term (current) drug therapy: Secondary | ICD-10-CM | POA: Insufficient documentation

## 2020-11-29 DIAGNOSIS — Z3A10 10 weeks gestation of pregnancy: Secondary | ICD-10-CM | POA: Diagnosis not present

## 2020-11-29 DIAGNOSIS — Z3A08 8 weeks gestation of pregnancy: Secondary | ICD-10-CM

## 2020-11-29 DIAGNOSIS — R102 Pelvic and perineal pain: Secondary | ICD-10-CM | POA: Insufficient documentation

## 2020-11-29 DIAGNOSIS — Z3490 Encounter for supervision of normal pregnancy, unspecified, unspecified trimester: Secondary | ICD-10-CM

## 2020-11-29 DIAGNOSIS — Z791 Long term (current) use of non-steroidal anti-inflammatories (NSAID): Secondary | ICD-10-CM | POA: Diagnosis not present

## 2020-11-29 DIAGNOSIS — Z6790 Unspecified blood type, Rh positive: Secondary | ICD-10-CM | POA: Diagnosis not present

## 2020-11-29 DIAGNOSIS — O4690 Antepartum hemorrhage, unspecified, unspecified trimester: Secondary | ICD-10-CM

## 2020-11-29 LAB — URINALYSIS, ROUTINE W REFLEX MICROSCOPIC
Bacteria, UA: NONE SEEN
Bilirubin Urine: NEGATIVE
Glucose, UA: NEGATIVE mg/dL
Ketones, ur: 20 mg/dL — AB
Leukocytes,Ua: NEGATIVE
Nitrite: NEGATIVE
Protein, ur: 30 mg/dL — AB
Specific Gravity, Urine: 1.023 (ref 1.005–1.030)
pH: 6 (ref 5.0–8.0)

## 2020-11-29 LAB — WET PREP, GENITAL
Sperm: NONE SEEN
Trich, Wet Prep: NONE SEEN
Yeast Wet Prep HPF POC: NONE SEEN

## 2020-11-29 LAB — COMPREHENSIVE METABOLIC PANEL
ALT: 16 U/L (ref 0–44)
AST: 14 U/L — ABNORMAL LOW (ref 15–41)
Albumin: 3.6 g/dL (ref 3.5–5.0)
Alkaline Phosphatase: 43 U/L (ref 38–126)
Anion gap: 6 (ref 5–15)
BUN: 6 mg/dL (ref 6–20)
CO2: 29 mmol/L (ref 22–32)
Calcium: 9.1 mg/dL (ref 8.9–10.3)
Chloride: 102 mmol/L (ref 98–111)
Creatinine, Ser: 0.57 mg/dL (ref 0.44–1.00)
GFR, Estimated: >60 mL/min (ref >60)
Glucose, Bld: 95 mg/dL (ref 70–99)
Potassium: 3.2 mmol/L — ABNORMAL LOW (ref 3.5–5.1)
Sodium: 137 mmol/L (ref 135–145)
Total Bilirubin: 0.6 mg/dL (ref 0.3–1.2)
Total Protein: 7.2 g/dL (ref 6.5–8.1)

## 2020-11-29 LAB — HCG, QUANTITATIVE, PREGNANCY: hCG, Beta Chain, Quant, S: 106065 m[IU]/mL — ABNORMAL HIGH (ref <5)

## 2020-11-29 LAB — CBC
HCT: 37.2 % (ref 36.0–46.0)
Hemoglobin: 12.5 g/dL (ref 12.0–15.0)
MCH: 30.5 pg (ref 26.0–34.0)
MCHC: 33.6 g/dL (ref 30.0–36.0)
MCV: 90.7 fL (ref 80.0–100.0)
Platelets: 248 10*3/uL (ref 150–400)
RBC: 4.1 MIL/uL (ref 3.87–5.11)
RDW: 13.9 % (ref 11.5–15.5)
WBC: 4.9 10*3/uL (ref 4.0–10.5)
nRBC: 0 % (ref 0.0–0.2)

## 2020-11-29 NOTE — MAU Provider Note (Signed)
History     CSN: [id number]  Arrival date and time: 11/29/20 0747   Event Date/Time   First Provider Initiated Contact with Patient 11/29/20 [phone]      Chief Complaint  Patient presents with  . Vaginal Bleeding   [name] is a 30 y.o. G2P1001 at [redacted]w[redacted]d who presents to MAU for vaginal bleeding which began Saturday night. Patient reports she is only experiencing spotting when wiping. Patient reports she also had a little pelvic pain last night, but denies pain at this time. Patient reports last intercourse on Friday night. Patient does not have any other concerns at this time  Pt denies vaginal discharge/odor/itching. Pt denies N/V, abdominal pain, constipation, diarrhea, or urinary problems. Pt denies fever, chills, fatigue, sweating or changes in appetite. Pt denies SOB or chest pain. Pt denies dizziness, HA, light-headedness, weakness.   OB History    Gravida  2   Para  1   Term  1   Preterm  0   AB  0   Living  1     SAB  0   IAB  0   Ectopic  0   Multiple  0   Live Births  1           Past Medical History:  Diagnosis Date  . Anemia   . Anxiety   . Blood transfusion during current hospitalization 02/03/2015  . Blood transfusion without reported diagnosis   . Depression   . GERD (gastroesophageal reflux disease)   . Headache   . Pregnancy induced hypertension   . Symptomatic anemia 02/03/2015  . Vaginal Pap smear, abnormal     Past Surgical History:  Procedure Laterality Date  . CESAREAN SECTION N/A 06/05/2020   Procedure: CESAREAN SECTION;  Surgeon: Conan Bowens, MD;  Location: [facility] LD ORS;  Service: Obstetrics;  Laterality: N/A;  . TONSILLECTOMY      Family History  Problem Relation Age of Onset  . Hypertension Mother   . Arthritis Mother   . Depression Mother   . Diabetes Father   . Diabetes Maternal Grandmother   . Cancer Maternal Grandmother   . Hypertension Maternal Grandmother   . Kidney disease Maternal Grandmother   .  Asthma Brother   . ADD / ADHD Maternal Uncle   . Diabetes Maternal Uncle   . Hypertension Maternal Uncle     Social History   Tobacco Use  . Smoking status: Former Smoker    Packs/day: 0.25    Years: 4.00    Pack years: 1.00    Types: Cigarettes  . Smokeless tobacco: Never Used  Vaping Use  . Vaping Use: Never used  Substance Use Topics  . Alcohol use: Not Currently    Alcohol/week: 0.0 standard drinks    Comment: Social  . Drug use: No    Allergies: No Known Allergies  Medications Prior to Admission  Medication Sig Dispense Refill Last Dose  . ibuprofen (ADVIL) 800 MG tablet Take 1 tablet (800 mg total) by mouth every 8 (eight) hours. (Patient not taking: Reported on 07/02/2020) 30 tablet 1   . norgestimate-ethinyl estradiol (ORTHO-CYCLEN) 0.25-35 MG-MCG tablet Take 1 tablet by mouth daily. 28 tablet 11   . oxyCODONE (OXY IR/ROXICODONE) 5 MG immediate release tablet Take 1 tablet (5 mg total) by mouth every 6 (six) hours as needed for moderate pain. (Patient not taking: Reported on 07/02/2020) 30 tablet 0   . Prenat-Fe Poly-Methfol-FA-DHA (VITAFOL ULTRA) 29-0.6-0.4-200 MG CAPS Take 1  tablet by mouth daily. (Patient not taking: No sig reported) 30 capsule 12   . promethazine (PHENERGAN) 25 MG tablet Take 1 tablet (25 mg total) by mouth every 6 (six) hours as needed for nausea or vomiting. 30 tablet 0     Review of Systems  Constitutional: Negative for chills, diaphoresis, fatigue and fever.  Eyes: Negative for visual disturbance.  Respiratory: Negative for shortness of breath.   Cardiovascular: Negative for chest pain.  Gastrointestinal: Negative for abdominal pain, constipation, diarrhea, nausea and vomiting.  Genitourinary: Positive for vaginal bleeding. Negative for dysuria, flank pain, frequency, pelvic pain, urgency and vaginal discharge.  Neurological: Negative for dizziness, weakness, light-headedness and headaches.   Physical Exam   Blood pressure (!) 104/50,  pulse 90, temperature 98.6 F (37 C), temperature source Oral, resp. rate 18, height 5\' 7"  (1.702 m), weight (!) 138.2 kg, last menstrual period 09/20/2020, SpO2 98 %, not currently breastfeeding.  Patient Vitals for the past 24 hrs:  BP Temp Temp src Pulse Resp SpO2 Height Weight  11/29/20 0809 (!) 104/50 98.6 F (37 C) Oral 90 18 98 % 5\' 7"  (1.702 m) (!) 138.2 kg   Physical Exam Vitals and nursing note reviewed.  Constitutional:      General: She is not in acute distress.    Appearance: Normal appearance. She is not ill-appearing, toxic-appearing or diaphoretic.  HENT:     Head: Normocephalic and atraumatic.  Pulmonary:     Effort: Pulmonary effort is normal.  Neurological:     Mental Status: She is alert and oriented to person, place, and time.  Psychiatric:        Mood and Affect: Mood normal.        Behavior: Behavior normal.        Thought Content: Thought content normal.        Judgment: Judgment normal.    Results for orders placed or performed during the hospital encounter of 11/29/20 (from the past 24 hour(s))  Urinalysis, Routine w reflex microscopic Urine, Clean Catch     Status: Abnormal   Collection Time: 11/29/20  8:03 AM  Result Value Ref Range   Color, Urine AMBER (A) YELLOW   APPearance HAZY (A) CLEAR   Specific Gravity, Urine 1.023 1.005 - 1.030   pH 6.0 5.0 - 8.0   Glucose, UA NEGATIVE NEGATIVE mg/dL   Hgb urine dipstick MODERATE (A) NEGATIVE   Bilirubin Urine NEGATIVE NEGATIVE   Ketones, ur 20 (A) NEGATIVE mg/dL   Protein, ur 30 (A) NEGATIVE mg/dL   Nitrite NEGATIVE NEGATIVE   Leukocytes,Ua NEGATIVE NEGATIVE   RBC / HPF 0-5 0 - 5 RBC/hpf   WBC, UA 6-10 0 - 5 WBC/hpf   Bacteria, UA NONE SEEN NONE SEEN   Squamous Epithelial / LPF 11-20 0 - 5   Mucus PRESENT   CBC     Status: None   Collection Time: 11/29/20  8:39 AM  Result Value Ref Range   WBC 4.9 4.0 - 10.5 K/uL   RBC 4.10 3.87 - 5.11 MIL/uL   Hemoglobin 12.5 12.0 - 15.0 g/dL   HCT 96.737.2 89.336.0 -  81.046.0 %   MCV 90.7 80.0 - 100.0 fL   MCH 30.5 26.0 - 34.0 pg   MCHC 33.6 30.0 - 36.0 g/dL   RDW 17.513.9 10.211.5 - 58.515.5 %   Platelets 248 150 - 400 K/uL   nRBC 0.0 0.0 - 0.2 %   US OB Comp Less 14 Wks  Result Date: 11/29/2020 CLINICAL  DATA:  Vaginal bleeding EXAM: OBSTETRIC <14 WK ULTRASOUND TECHNIQUE: Transabdominal ultrasound was performed for evaluation of the gestation as well as the maternal uterus and adnexal regions. COMPARISON:  None. FINDINGS: Intrauterine gestational sac: Single Yolk sac:  Visualized. Embryo:  Visualized. Cardiac Activity: Visualized. Heart Rate: 169 bpm CRL:   21.5 mm   8 w 5 d                  [place] EDC: 07/06/2021 Subchorionic hemorrhage:  None visualized. Maternal uterus/adnexae: Normal. IMPRESSION: Single intrauterine gestation at sonographic gestational age of [redacted] weeks, 5 days. EDD 07/06/2021. Fetal heart rate 169 bpm. Electronically Signed   By: Lauralyn Primes M.D.   On: 11/29/2020 09:26    MAU Course  Procedures  MDM -r/o ectopic -UA: amber/hazy/mod hgb/20/30PRO, urine sent for culture -CBC: WNL -CMP: pending at time of discharge -[place]: single IUP, +yolk sac, +embryo, +FHR 169, [redacted]w[redacted]d -hCG: pending at time of discharge -ABO: O Positive -WetPrep: pending at time of discharge -GC/CT collected -pt discharged to home in stable condition  Orders Placed This Encounter  Procedures  . Wet prep, genital    Standing Status:   Standing    Number of Occurrences:   1  . Culture, OB Urine    Standing Status:   Standing    Number of Occurrences:   1  . US OB Comp Less 14 Wks    Standing Status:   Standing    Number of Occurrences:   1    Order Specific Question:   Symptom/Reason for Exam    Answer:   Vaginal bleeding in pregnancy [705036]  . Urinalysis, Routine w reflex microscopic Urine, Clean Catch    Standing Status:   Standing    Number of Occurrences:   1  . CBC    Standing Status:   Standing    Number of Occurrences:   1  . Comprehensive metabolic panel     Standing Status:   Standing    Number of Occurrences:   1  . hCG, quantitative, pregnancy    Standing Status:   Standing    Number of Occurrences:   1  . Discharge patient    Order Specific Question:   Discharge disposition    Answer:   01-Home or Self Care [1]    Order Specific Question:   Discharge patient date    Answer:   11/29/2020   No orders of the defined types were placed in this encounter.  Assessment and Plan   1. Intrauterine pregnancy   2. Vaginal bleeding in pregnancy   3. [redacted] weeks gestation of pregnancy   4. Blood type, Rh positive     Allergies as of 11/29/2020   No Known Allergies     Medication List    STOP taking these medications   ibuprofen 800 MG tablet Commonly known as: ADVIL   norgestimate-ethinyl estradiol 0.25-35 MG-MCG tablet Commonly known as: ORTHO-CYCLEN     TAKE these medications   oxyCODONE 5 MG immediate release tablet Commonly known as: Oxy IR/ROXICODONE Take 1 tablet (5 mg total) by mouth every 6 (six) hours as needed for moderate pain.   promethazine 25 MG tablet Commonly known as: PHENERGAN Take 1 tablet (25 mg total) by mouth every 6 (six) hours as needed for nausea or vomiting.   Vitafol Ultra 29-0.6-0.4-200 MG Caps Take 1 tablet by mouth daily.      -will call with culture results, if positive -safe meds in pregnancy  list given -list of OB providers given -pt to start OB care -return MAU precautions -pt discharged to home in stable condition  Joni Reining E Chasey Dull 11/29/2020, 9:56 AM

## 2020-11-29 NOTE — Discharge Instructions (Signed)
Prenatal Care Providers           [facility] @ [facility] - accepts patients without insurance  Phone: [phone]  Center for [facility] @ [facility]   Phone: [phone]  [facility] @[place]       Phone: [phone]            [facility] for [facility] @ [place]     Phone: [phone]          [facility] for [facility] @ [place]   Phone: [phone]  [facility] for [facility] @ [facility] - accepts patients without insurance  Phone: [phone]  [facility] for [facility] @ [facility] Phone: [phone]     [facility] - accepts patients without insurance Phone: [phone]  [facility]  Phone: [phone]  [phone] OB/GYN Phone: [phone]  [facility] Phone: [phone]  [facility] Phone: [phone]  [facility] Phone: [phone]  [facility]  Phone: [phone]                         Safe Medications in Pregnancy    Acne: Benzoyl Peroxide Salicylic Acid  Backache/Headache: Tylenol: 2 regular strength every 4 hours OR              2 Extra strength every 6 hours  Colds/Coughs/Allergies: Benadryl (alcohol free) 25 mg every 6 hours as needed Breath right strips Claritin Cepacol throat lozenges Chloraseptic throat spray Cold-Eeze- up to three times per day Cough drops, alcohol free Flonase (by prescription only) Guaifenesin Mucinex Robitussin DM (plain only, alcohol free) Saline nasal spray/drops Sudafed (pseudoephedrine) & Actifed ** use only after [redacted] weeks gestation and if you do not have high blood pressure Tylenol Vicks Vaporub Zinc lozenges Zyrtec   Constipation: Colace Ducolax suppositories Fleet enema Glycerin suppositories Metamucil Milk of magnesia Miralax Senokot Smooth move tea  Diarrhea: Kaopectate Imodium A-D  *NO pepto  Bismol  Hemorrhoids: Anusol Anusol HC Preparation H Tucks  Indigestion: Tums Maalox Mylanta Zantac  Pepcid  Insomnia: Benadryl (alcohol free) 25mg  every 6 hours as needed Tylenol PM Unisom, no Gelcaps  Leg Cramps: Tums MagGel  Nausea/Vomiting:  Bonine Dramamine Emetrol Ginger extract Sea bands Meclizine  Nausea medication to take during pregnancy:  Unisom (doxylamine succinate 25 mg tablets) Take one tablet daily at bedtime. If symptoms are not adequately controlled, the dose can be increased to a maximum recommended dose of two tablets daily (1/2 tablet in the morning, 1/2 tablet mid-afternoon and one at bedtime). Vitamin B6 100mg  tablets. Take one tablet twice a day (up to 200 mg per day).  Skin Rashes: Aveeno products Benadryl cream or 25mg  every 6 hours as needed Calamine Lotion 1% cortisone cream  Yeast infection: Gyne-lotrimin 7 Monistat 7   **If taking multiple medications, please check labels to avoid duplicating the same active ingredients **take medication as directed on the label ** Do not exceed 4000 mg of tylenol in 24 hours **Do not take medications that contain aspirin or ibuprofen           Vaginal Bleeding During Pregnancy, First Trimester A small amount of bleeding from the vagina, or spotting, is common during early pregnancy. Some bleeding may be related to the pregnancy, and some may not. In many cases, the bleeding is normal and is not a problem. However, bleeding can also be a sign of something serious. Normal things that may cause  bleeding during the first trimester:  Implantation of the fertilized egg in the lining of the uterus.  Rapid changes in blood vessels. This is caused by changes that are happening to the body during pregnancy.  Sex.  Pelvic exams. Abnormal things that may cause bleeding during the first trimester include:  Infection or inflammation of the cervix.  Growths or polyps on the  cervix.  Miscarriage or threatened miscarriage.  Pregnancy that is growing outside of the uterus (ectopic pregnancy).  A fertilized egg that becomes a mass of tissue (molar pregnancy). Tell your health care provider right away if there is any bleeding from your vagina. Follow these instructions at home: Monitoring your bleeding Monitor your bleeding.  Pay attention to any changes in your symptoms. Let your health care provider know about any concerns.  Try to understand when the bleeding occurs. Does the bleeding start on its own, or does it start after something is done, such as sex or a pelvic exam?  Use a diary to record the things you see about your bleeding, including: ? The kind of bleeding you are having. Does the bleeding start and stop irregularly, or is it a constant flow? ? The severity of your bleeding. Is the bleeding heavy or light? ? The number of pads you use each day, how often you change them, and how soaked they are.  Tell your health care provider if you pass tissue. He or she may want to see it.   Activity  Follow instructions from your health care provider about limiting your activity. Ask what activities are safe for you.  Do not have sex until your health care provider says that this is safe.  If needed, make plans for someone to help with your regular activities. General instructions  Take over-the-counter and prescription medicines only as told by your health care provider.  Do not take aspirin because it can cause bleeding.  Do not use tampons or douche.  Keep all follow-up visits. This is important. Contact a health care provider if:  You have vaginal bleeding during any part of your pregnancy.  You have cramps or labor pains.  You have a fever or chills. Get help right away if:  You have severe cramps in your back or abdomen.  You pass large clots or a large amount of tissue from your vagina.  Your bleeding increases.  You feel  light-headed or weak, or you faint.  You are leaking fluid or have a gush of fluid from your vagina. Summary  A small amount of bleeding from the vagina is common during early pregnancy.  Be sure to tell your health care provider about any vaginal bleeding right away.  Try to understand when bleeding occurs. Does bleeding occur on its own, or does it occur after something is done, such as sex or pelvic exams?  Keep all follow-up visits. This is important. This information is not intended to replace advice given to you by your health care provider. Make sure you discuss any questions you have with your health care provider. Document Revised: 03/12/2020 Document Reviewed: 03/12/2020 Elsevier Patient Education  2021 Elsevier Inc.        Obstetrics: Normal and Problem Pregnancies (7th ed., pp. 102-121). [place], [place]: Elsevier."> Textbook of Family Medicine (9th ed., pp. [phone]). [place], [place]: Elsevier [place].">  First Trimester of Pregnancy  The first trimester of pregnancy starts on the first day of your last menstrual period until the end of week 12. This is months  1 through 3 of pregnancy. A week after a sperm fertilizes an egg, the egg will implant into the wall of the uterus and begin to develop into a baby. By the end of 12 weeks, all the baby's organs will be formed and the baby will be 2-3 inches in size. Body changes during your first trimester Your body goes through many changes during pregnancy. The changes vary and generally return to normal after your baby is born. Physical changes  You may gain or lose weight.  Your breasts may begin to grow larger and become tender. The tissue that surrounds your nipples (areola) may become darker.  Dark spots or blotches (chloasma or mask of pregnancy) may develop on your face.  You may have changes in your hair. These can include thickening or thinning of your hair or changes in texture. Health changes  You may feel  nauseous, and you may vomit.  You may have heartburn.  You may develop headaches.  You may develop constipation.  Your gums may bleed and may be sensitive to brushing and flossing. Other changes  You may tire easily.  You may urinate more often.  Your menstrual periods will stop.  You may have a loss of appetite.  You may develop cravings for certain kinds of food.  You may have changes in your emotions from day to day.  You may have more vivid and strange dreams. Follow these instructions at home: Medicines  Follow your health care provider's instructions regarding medicine use. Specific medicines may be either safe or unsafe to take during pregnancy. Do not take any medicines unless told to by your health care provider.  Take a prenatal vitamin that contains at least 600 micrograms (mcg) of folic acid. Eating and drinking  Eat a healthy diet that includes fresh fruits and vegetables, whole grains, good sources of protein such as meat, eggs, or tofu, and low-fat dairy products.  Avoid raw meat and unpasteurized juice, milk, and cheese. These carry germs that can harm you and your baby.  If you feel nauseous or you vomit: ? Eat 4 or 5 small meals a day instead of 3 large meals. ? Try eating a few soda crackers. ? Drink liquids between meals instead of during meals.  You may need to take these actions to prevent or treat constipation: ? Drink enough fluid to keep your urine pale yellow. ? Eat foods that are high in fiber, such as beans, whole grains, and fresh fruits and vegetables. ? Limit foods that are high in fat and processed sugars, such as fried or sweet foods. Activity  Exercise only as directed by your health care provider. Most people can continue their usual exercise routine during pregnancy. Try to exercise for 30 minutes at least 5 days a week.  Stop exercising if you develop pain or cramping in the lower abdomen or lower back.  Avoid exercising if it is  very hot or humid or if you are at high altitude.  Avoid heavy lifting.  If you choose to, you may have sex unless your health care provider tells you not to. Relieving pain and discomfort  Wear a good support bra to relieve breast tenderness.  Rest with your legs elevated if you have leg cramps or low back pain.  If you develop bulging veins (varicose veins) in your legs: ? Wear support hose as told by your health care provider. ? Elevate your feet for 15 minutes, 3-4 times a day. ? Limit salt in  your diet. Safety  Wear your seat belt at all times when driving or riding in a car.  Talk with your health care provider if someone is verbally or physically abusive to you.  Talk with your health care provider if you are feeling sad or have thoughts of hurting yourself. Lifestyle  Do not use hot tubs, steam rooms, or saunas.  Do not douche. Do not use tampons or scented sanitary pads.  Do not use herbal remedies, alcohol, illegal drugs, or medicines that are not approved by your health care provider. Chemicals in these products can harm your baby.  Do not use any products that contain nicotine or tobacco, such as cigarettes, e-cigarettes, and chewing tobacco. If you need help quitting, ask your health care provider.  Avoid cat litter boxes and soil used by cats. These carry germs that can cause birth defects in the baby and possibly loss of the unborn baby (fetus) by miscarriage or stillbirth. General instructions  During routine prenatal visits in the first trimester, your health care provider will do a physical exam, perform necessary tests, and ask you how things are going. Keep all follow-up visits. This is important.  Ask for help if you have counseling or nutritional needs during pregnancy. Your health care provider can offer advice or refer you to specialists for help with various needs.  Schedule a dentist appointment. At home, brush your teeth with a soft toothbrush. Floss  gently.  Write down your questions. Take them to your prenatal visits. Where to find more information  American Pregnancy Association: americanpregnancy.org  [employer] Obstetricians and Gynecologists: https://www.todd-brady.net/acog.org/en/Womens%20Health/Pregnancy  Office on [employer]: MightyReward.co.nzwomenshealth.gov/pregnancy Contact a health care provider if you have:  Dizziness.  A fever.  Mild pelvic cramps, pelvic pressure, or nagging pain in the abdominal area.  Nausea, vomiting, or diarrhea that lasts for 24 hours or longer.  A bad-smelling vaginal discharge.  Pain when you urinate.  Known exposure to a contagious illness, such as chickenpox, measles, Zika virus, HIV, or hepatitis. Get help right away if you have:  Spotting or bleeding from your vagina.  Severe abdominal cramping or pain.  Shortness of breath or chest pain.  Any kind of trauma, such as from a fall or a car crash.  New or increased pain, swelling, or redness in an arm or leg. Summary  The first trimester of pregnancy starts on the first day of your last menstrual period until the end of week 12 (months 1 through 3).  Eating 4 or 5 small meals a day rather than 3 large meals may help to relieve nausea and vomiting.  Do not use any products that contain nicotine or tobacco, such as cigarettes, e-cigarettes, and chewing tobacco. If you need help quitting, ask your health care provider.  Keep all follow-up visits. This is important. This information is not intended to replace advice given to you by your health care provider. Make sure you discuss any questions you have with your health care provider. Document Revised: 11/27/2019 Document Reviewed: 10/03/2019 Elsevier Patient Education  2021 [employer]

## 2020-11-29 NOTE — MAU Note (Signed)
Carmen Price is a 30 y.o. at [redacted]w[redacted]d here in MAU reporting: since last night has been seeing some bleeding when she wipes in the bathroom. No clots. Had some pain last night but none today. No other abnormal discharge. Last IC was Friday night.  Onset of complaint: last night  Pain score: 0/10  Vitals:   11/29/20 0809  BP: (!) 104/50  Pulse: 90  Resp: 18  Temp: 98.6 F (37 C)  SpO2: 98%     Lab orders placed from triage: UA

## 2020-11-30 LAB — CULTURE, OB URINE

## 2020-12-01 LAB — GC/CHLAMYDIA PROBE AMP (~~LOC~~) NOT AT ARMC
Chlamydia: NEGATIVE
Comment: NEGATIVE
Comment: NORMAL
Neisseria Gonorrhea: NEGATIVE

## 2020-12-07 ENCOUNTER — Ambulatory Visit (INDEPENDENT_AMBULATORY_CARE_PROVIDER_SITE_OTHER): Payer: Medicaid Other

## 2020-12-07 ENCOUNTER — Other Ambulatory Visit: Payer: Self-pay

## 2020-12-07 DIAGNOSIS — Z3491 Encounter for supervision of normal pregnancy, unspecified, first trimester: Secondary | ICD-10-CM | POA: Insufficient documentation

## 2020-12-07 DIAGNOSIS — Z3480 Encounter for supervision of other normal pregnancy, unspecified trimester: Secondary | ICD-10-CM | POA: Insufficient documentation

## 2020-12-07 DIAGNOSIS — Z3481 Encounter for supervision of other normal pregnancy, first trimester: Secondary | ICD-10-CM

## 2020-12-07 NOTE — Progress Notes (Signed)
New OB Intake  I connected with  Carmen Price on 12/07/20 at  1:15 PM EDT by telephone and verified that I am speaking with the correct person using two identifiers. Nurse is located at [facility] and pt is located at home.  I discussed the limitations, risks, security and privacy concerns of performing an evaluation and management service by telephone and the availability of in person appointments. I also discussed with the patient that there may be a patient responsible charge related to this service. The patient expressed understanding and agreed to proceed.  I explained I am completing New OB Intake today. We discussed her EDD of 07/06/21 that is based on early ultrasound. Pt is G2/P1001. I reviewed her allergies, medications, Medical/Surgical/OB history, and appropriate screenings. I informed her of [facility] services. Based on history, this is a/an uncomplicated pregnancy.  Patient Active Problem List   Diagnosis Date Noted  . Encounter for supervision of normal pregnancy in first trimester 12/07/2020  . Cesarean delivery delivered 06/05/2020  . ACNE VULGARIS, FACIAL 03/13/2007    Concerns addressed today  Delivery Plans:  Plans to deliver at [facility].   MyChart/Babyscripts MyChart access verified. I explained pt will have some visits in office and some virtually. Babyscripts instructions given and order placed. Patient verifies receipt of registration text/e-mail. Account successfully created and app downloaded.  Blood Pressure Cuff Patient has cuff at home. Explained after first prenatal appt pt will check weekly and document in Babyscripts.  Anatomy US Explained first scheduled Korea will be around 19 weeks.   Labs Discussed Avelina Laine genetic screening with patient. Would like both Panorama and Horizon drawn at new OB visit. Routine prenatal labs needed.  Covid Vaccine Patient has not covid vaccine.   Social Determinants of Health . Food Insecurity: Patient denies food  insecurity. . WIC Referral: Patient is interested in referral to [facility]  . Transportation: Patient denies transportation needs. . Childcare: Discussed no children allowed at ultrasound appointments. Offered childcare services; patient declines childcare services at this time.  First visit review I reviewed new OB appt with pt. I explained she will have a pelvic exam, ob bloodwork with genetic screening, and PAP smear. Explained pt will be seen by Coral Ceo at first visit; encounter routed to appropriate provider. Explained that patient will be seen by pregnancy navigator following visit with provider.  Hamilton Capri, RN 12/07/2020  1:26 PM

## 2020-12-07 NOTE — Progress Notes (Signed)
I have reviewed this chart and agree with the RN/CMA assessment and management.    K. Meryl Kamon Fahr, MD, FACOG Attending [facility] (Faculty Practice)  

## 2020-12-14 ENCOUNTER — Ambulatory Visit (INDEPENDENT_AMBULATORY_CARE_PROVIDER_SITE_OTHER): Payer: Medicaid Other | Admitting: Obstetrics

## 2020-12-14 ENCOUNTER — Encounter: Payer: Self-pay | Admitting: Obstetrics

## 2020-12-14 ENCOUNTER — Other Ambulatory Visit: Payer: Self-pay

## 2020-12-14 ENCOUNTER — Other Ambulatory Visit (HOSPITAL_COMMUNITY)
Admission: RE | Admit: 2020-12-14 | Discharge: 2020-12-14 | Disposition: A | Discharge status: Home / Self Care | Payer: Medicaid Other | Source: Ambulatory Visit | Attending: Obstetrics | Admitting: Obstetrics

## 2020-12-14 VITALS — BP 120/74 | HR 103 | Wt 312.0 lb

## 2020-12-14 DIAGNOSIS — Z3A10 10 weeks gestation of pregnancy: Secondary | ICD-10-CM

## 2020-12-14 DIAGNOSIS — Z3480 Encounter for supervision of other normal pregnancy, unspecified trimester: Secondary | ICD-10-CM

## 2020-12-14 DIAGNOSIS — Z8759 Personal history of other complications of pregnancy, childbirth and the puerperium: Secondary | ICD-10-CM

## 2020-12-14 DIAGNOSIS — O99210 Obesity complicating pregnancy, unspecified trimester: Secondary | ICD-10-CM

## 2020-12-14 MED ORDER — ASPIRIN 81 MG PO CHEW: 81.0000 mg | CHEWABLE_TABLET | Freq: Every day | ORAL | Status: DC

## 2020-12-14 MED ORDER — VITAFOL ULTRA 29-0.6-0.4-200 MG PO CAPS: 1.0000 | ORAL_CAPSULE | Freq: Every day | ORAL | 4 refills | Status: DC

## 2020-12-14 NOTE — Progress Notes (Signed)
Subjective:    Carmen Price is being seen today for her first obstetrical visit.  This is not a planned pregnancy. She is at [redacted]w[redacted]d gestation. Her obstetrical history is significant for pregnancy induced hypertension. Relationship with FOB: significant other, living together. Patient does intend to breast feed. Pregnancy history fully reviewed.  The information documented in the HPI was reviewed and verified.  Menstrual History: OB History     Gravida  2   Para  1   Term  1   Preterm  0   AB  0   Living  1      SAB  0   IAB  0   Ectopic  0   Multiple  0   Live Births  1            Patient's last menstrual period was 09/20/2020.    Past Medical History:  Diagnosis Date   Anemia    Anxiety    Blood transfusion during current hospitalization 02/03/2015   Blood transfusion without reported diagnosis    Depression    GERD (gastroesophageal reflux disease)    Headache    Pregnancy induced hypertension    Symptomatic anemia 02/03/2015   Vaginal Pap smear, abnormal     Past Surgical History:  Procedure Laterality Date   CESAREAN SECTION N/A 06/05/2020   Procedure: CESAREAN SECTION;  Surgeon: Conan Bowens, MD;  Location: [facility] LD ORS;  Service: Obstetrics;  Laterality: N/A;   TONSILLECTOMY      (Not in a hospital admission)  No Known Allergies  Social History   Tobacco Use   Smoking status: Former    Packs/day: 0.25    Years: 4.00    Pack years: 1.00    Types: Cigarettes   Smokeless tobacco: Never  Substance Use Topics   Alcohol use: Not Currently    Alcohol/week: 0.0 standard drinks    Comment: not since confirmed pregnancy    Family History  Problem Relation Age of Onset   Hypertension Mother    Arthritis Mother    Depression Mother    Diabetes Father    Diabetes Maternal Grandmother    Cancer Maternal Grandmother    Hypertension Maternal Grandmother    Kidney disease Maternal Grandmother    Asthma Brother    ADD / ADHD Maternal Uncle     Diabetes Maternal Uncle    Hypertension Maternal Uncle      Review of Systems Constitutional: negative for weight loss Gastrointestinal: negative for vomiting Genitourinary:negative for genital lesions and vaginal discharge and dysuria Musculoskeletal:negative for back pain Behavioral/Psych: negative for abusive relationship, depression, illegal drug usage and tobacco use    Objective:    BP 120/74   Pulse (!) 103   Wt (!) 312 lb (141.5 kg)   LMP 09/20/2020   BMI 48.87 kg/m  General Appearance:    Alert, cooperative, no distress, appears stated age  Head:    Normocephalic, without obvious abnormality, atraumatic  Eyes:    PERRL, conjunctiva/corneas clear, EOM's intact, fundi    benign, both eyes  Ears:    Normal TM's and external ear canals, both ears  Nose:   Nares normal, septum midline, mucosa normal, no drainage    or sinus tenderness  Throat:   Lips, mucosa, and tongue normal; teeth and gums normal  Neck:   Supple, symmetrical, trachea midline, no adenopathy;    thyroid:  no enlargement/tenderness/nodules; no carotid   bruit or JVD  Back:  Symmetric, no curvature, ROM normal, no CVA tenderness  Lungs:     Clear to auscultation bilaterally, respirations unlabored  Chest Wall:    No tenderness or deformity   Heart:    Regular rate and rhythm, S1 and S2 normal, no murmur, rub   or gallop  Breast Exam:    No tenderness, masses, or nipple abnormality  Abdomen:     Soft, non-tender, bowel sounds active all four quadrants,    no masses, no organomegaly  Genitalia:    Normal female without lesion, discharge or tenderness  Extremities:   Extremities normal, atraumatic, no cyanosis or edema  Pulses:   2+ and symmetric all extremities  Skin:   Skin color, texture, turgor normal, no rashes or lesions  Lymph nodes:   Cervical, supraclavicular, and axillary nodes normal  Neurologic:   CNII-XII intact, normal strength, sensation and reflexes    throughout      Lab Review Urine  pregnancy test Labs reviewed yes Radiologic studies reviewed yes  Assessment:    Pregnancy at [redacted]w[redacted]d weeks    Plan:    1. Supervision of other normal pregnancy, antepartum Rx: - Cervicovaginal ancillary only( [place]) - Obstetric Panel, Including HIV - Culture, OB Urine - Prenat-Fe Poly-Methfol-FA-DHA (VITAFOL ULTRA) 29-0.6-0.4-200 MG CAPS; Take 1 capsule by mouth daily before breakfast.  Dispense: 90 capsule; Refill: 4  2. Obesity affecting pregnancy, antepartum  3. H/O pre-eclampsia Rx: - aspirin chewable tablet 81 mg   Prenatal vitamins.  Counseling provided regarding continued use of seat belts, cessation of alcohol consumption, smoking or use of illicit drugs; infection precautions i.e., influenza/TDAP immunizations, toxoplasmosis,CMV, parvovirus, listeria and varicella; workplace safety, exercise during pregnancy; routine dental care, safe medications, sexual activity, hot tubs, saunas, pools, travel, caffeine use, fish and methlymercury, potential toxins, hair treatments, varicose veins Weight gain recommendations per IOM guidelines reviewed: underweight/BMI< 18.5--> gain 28 - 40 lbs; normal weight/BMI 18.5 - 24.9--> gain 25 - 35 lbs; overweight/BMI 25 - 29.9--> gain 15 - 25 lbs; obese/BMI >30->gain  11 - 20 lbs Problem list reviewed and updated. FIRST/CF mutation testing/NIPT/QUAD SCREEN/fragile X/Ashkenazi Jewish population testing/Spinal muscular atrophy discussed: requested. Role of ultrasound in pregnancy discussed; fetal survey: requested. Amniocentesis discussed: not indicated.  Meds ordered this encounter  Medications   Prenat-Fe Poly-Methfol-FA-DHA (VITAFOL ULTRA) 29-0.6-0.4-200 MG CAPS    Sig: Take 1 capsule by mouth daily before breakfast.    Dispense:  90 capsule    Refill:  4   Orders Placed This Encounter  Procedures   Culture, OB Urine   Obstetric Panel, Including HIV    Follow up in 4 weeks.  I have spent a total of 20 minutes of face-to-face  time, excluding clinical staff time, reviewing notes and preparing to see patient, ordering tests and/or medications, and counseling the patient.    Brock Bad, MD 12/14/2020 4:38 PM

## 2020-12-14 NOTE — Progress Notes (Signed)
New OB Labs pended No complaints Depression and anxiety screen negative

## 2020-12-15 LAB — OBSTETRIC PANEL, INCLUDING HIV
Antibody Screen: NEGATIVE
Basophils Absolute: 0 10*3/uL (ref 0.0–0.2)
Basos: 1 %
EOS (ABSOLUTE): 0 10*3/uL (ref 0.0–0.4)
Eos: 1 %
HIV Screen 4th Generation wRfx: NONREACTIVE
Hematocrit: 35.3 % (ref 34.0–46.6)
Hemoglobin: 11.6 g/dL (ref 11.1–15.9)
Hepatitis B Surface Ag: NEGATIVE
Immature Grans (Abs): 0 10*3/uL (ref 0.0–0.1)
Immature Granulocytes: 0 %
Lymphocytes Absolute: 1.6 10*3/uL (ref 0.7–3.1)
Lymphs: 28 %
MCH: 30.1 pg (ref 26.6–33.0)
MCHC: 32.9 g/dL (ref 31.5–35.7)
MCV: 92 fL (ref 79–97)
Monocytes Absolute: 0.4 10*3/uL (ref 0.1–0.9)
Monocytes: 8 %
Neutrophils Absolute: 3.7 10*3/uL (ref 1.4–7.0)
Neutrophils: 62 %
Platelets: 284 10*3/uL (ref 150–450)
RBC: 3.85 x10E6/uL (ref 3.77–5.28)
RDW: 14.3 % (ref 11.7–15.4)
RPR Ser Ql: NONREACTIVE
Rh Factor: POSITIVE
Rubella Antibodies, IGG: 4.37 index (ref >0.99)
WBC: 5.8 10*3/uL (ref 3.4–10.8)

## 2020-12-16 ENCOUNTER — Other Ambulatory Visit (HOSPITAL_COMMUNITY)
Admission: RE | Admit: 2020-12-16 | Discharge: 2020-12-16 | Disposition: A | Discharge status: Home / Self Care | Payer: Medicaid Other | Source: Ambulatory Visit | Attending: Obstetrics | Admitting: Obstetrics

## 2020-12-16 DIAGNOSIS — Z3480 Encounter for supervision of other normal pregnancy, unspecified trimester: Secondary | ICD-10-CM | POA: Diagnosis not present

## 2020-12-16 LAB — URINE CULTURE, OB REFLEX

## 2020-12-16 LAB — CERVICOVAGINAL ANCILLARY ONLY
Chlamydia: NEGATIVE
Comment: NEGATIVE
Comment: NORMAL
Neisseria Gonorrhea: NEGATIVE

## 2020-12-16 LAB — CULTURE, OB URINE

## 2020-12-16 NOTE — Addendum Note (Signed)
Addended by: Kennon Portela on: 12/16/2020 03:43 PM   Modules accepted: Orders

## 2020-12-17 LAB — CYTOLOGY - PAP
Chlamydia: NEGATIVE
Comment: NEGATIVE
Comment: NORMAL
Diagnosis: NEGATIVE
Neisseria Gonorrhea: NEGATIVE

## 2021-01-11 ENCOUNTER — Other Ambulatory Visit: Payer: Self-pay

## 2021-01-11 ENCOUNTER — Encounter: Payer: Self-pay | Admitting: Obstetrics and Gynecology

## 2021-01-11 ENCOUNTER — Ambulatory Visit (INDEPENDENT_AMBULATORY_CARE_PROVIDER_SITE_OTHER): Payer: Medicaid Other | Admitting: Obstetrics and Gynecology

## 2021-01-11 VITALS — BP 116/79 | HR 92 | Wt 315.0 lb

## 2021-01-11 DIAGNOSIS — O34219 Maternal care for unspecified type scar from previous cesarean delivery: Secondary | ICD-10-CM

## 2021-01-11 DIAGNOSIS — O99210 Obesity complicating pregnancy, unspecified trimester: Secondary | ICD-10-CM

## 2021-01-11 DIAGNOSIS — Z3481 Encounter for supervision of other normal pregnancy, first trimester: Secondary | ICD-10-CM

## 2021-01-11 MED ORDER — ASPIRIN EC 81 MG PO TBEC: 81.0000 mg | DELAYED_RELEASE_TABLET | Freq: Every day | ORAL | 2 refills | Status: DC

## 2021-01-11 NOTE — Progress Notes (Signed)
   PRENATAL VISIT NOTE  Subjective:  Carmen Price is a 30 y.o. G2P1001 at [redacted]w[redacted]d being seen today for ongoing prenatal care.  She is currently monitored for the following issues for this high-risk pregnancy and has ACNE VULGARIS, FACIAL; Maternal obesity affecting pregnancy, antepartum; Cesarean delivery delivered; Encounter for supervision of normal pregnancy in first trimester; and Previous cesarean delivery affecting pregnancy, antepartum on their problem list.  Patient reports no complaints.  Contractions: Not present. Vag. Bleeding: None.  Movement: Absent. Denies leaking of fluid.   The following portions of the patient's history were reviewed and updated as appropriate: allergies, current medications, past family history, past medical history, past social history, past surgical history and problem list.   Objective:   Vitals:   01/11/21 1424  BP: 116/79  Pulse: 92  Weight: (!) 315 lb (142.9 kg)    Fetal Status:     Movement: Absent     General:  Alert, oriented and cooperative. Patient is in no acute distress.  Skin: Skin is warm and dry. No rash noted.   Cardiovascular: Normal heart rate noted  Respiratory: Normal respiratory effort, no problems with respiration noted  Abdomen: Soft, gravid, appropriate for gestational age.  Pain/Pressure: Absent     Pelvic: Cervical exam deferred        Extremities: Normal range of motion.  Edema: None  Mental Status: Normal mood and affect. Normal behavior. Normal judgment and thought content.   Assessment and Plan:  Pregnancy: G2P1001 at [redacted]w[redacted]d 1. Encounter for supervision of other normal pregnancy in first trimester Patient is doing well  Rx ASA provided due to maternal obesity Panorama today Anatomy ultrasound ordered  2. Previous cesarean delivery affecting pregnancy, antepartum Patient is interested in [facility] Information provided  Preterm labor symptoms and general obstetric precautions including but not limited to vaginal  bleeding, contractions, leaking of fluid and fetal movement were reviewed in detail with the patient. Please refer to After Visit Summary for other counseling recommendations.   Return in about 4 weeks (around 02/08/2021) for in person, ROB, High risk.  Future Appointments  Date Time Provider Department Center  01/11/2021  3:00 PM Marikay Roads, Gigi Gin, MD [facility] None    Catalina Antigua, MD

## 2021-01-11 NOTE — Addendum Note (Signed)
Addended by: Catalina Antigua on: 01/11/2021 02:38 PM   Modules accepted: Orders

## 2021-01-11 NOTE — Progress Notes (Signed)
No complaints

## 2021-01-12 LAB — HEMOGLOBIN A1C
Est. average glucose Bld gHb Est-mCnc: 105 mg/dL
Hgb A1c MFr Bld: 5.3 % (ref 4.8–5.6)

## 2021-01-20 ENCOUNTER — Encounter: Payer: Self-pay | Admitting: Obstetrics and Gynecology

## 2021-02-08 ENCOUNTER — Other Ambulatory Visit: Payer: Self-pay

## 2021-02-08 ENCOUNTER — Encounter: Payer: Self-pay | Admitting: Obstetrics & Gynecology

## 2021-02-08 ENCOUNTER — Ambulatory Visit (INDEPENDENT_AMBULATORY_CARE_PROVIDER_SITE_OTHER): Payer: Medicaid Other | Admitting: Obstetrics & Gynecology

## 2021-02-08 VITALS — BP 124/81 | HR 97 | Wt 316.0 lb

## 2021-02-08 DIAGNOSIS — O99210 Obesity complicating pregnancy, unspecified trimester: Secondary | ICD-10-CM

## 2021-02-08 DIAGNOSIS — Z3481 Encounter for supervision of other normal pregnancy, first trimester: Secondary | ICD-10-CM

## 2021-02-08 DIAGNOSIS — O34219 Maternal care for unspecified type scar from previous cesarean delivery: Secondary | ICD-10-CM

## 2021-02-08 NOTE — Progress Notes (Signed)
   PRENATAL VISIT NOTE  Subjective:  Carmen Price is a 30 y.o. G2P1001 at [redacted]w[redacted]d being seen today for ongoing prenatal care.  She is currently monitored for the following issues for this high-risk pregnancy and has Maternal obesity affecting pregnancy, antepartum; Encounter for supervision of normal pregnancy in first trimester; and Previous cesarean delivery affecting pregnancy, antepartum on their problem list.  Patient reports no complaints.  Contractions: Not present. Vag. Bleeding: None.  Movement: Absent. Denies leaking of fluid.   The following portions of the patient's history were reviewed and updated as appropriate: allergies, current medications, past family history, past medical history, past social history, past surgical history and problem list.   Objective:   Vitals:   02/08/21 0906  BP: 124/81  Pulse: 97  Weight: (!) 316 lb (143.3 kg)    Fetal Status: Fetal Heart Rate (bpm): 140   Movement: Absent     General:  Alert, oriented and cooperative. Patient is in no acute distress.  Skin: Skin is warm and dry. No rash noted.   Cardiovascular: Normal heart rate noted  Respiratory: Normal respiratory effort, no problems with respiration noted  Abdomen: Soft, gravid, appropriate for gestational age.  Pain/Pressure: Absent     Pelvic: Cervical exam deferred        Extremities: Normal range of motion.  Edema: Trace  Mental Status: Normal mood and affect. Normal behavior. Normal judgment and thought content.   Assessment and Plan:  Pregnancy: G2P1001 at [redacted]w[redacted]d 1. Encounter for supervision of other normal pregnancy in first trimester Second trimester  2. Previous cesarean delivery affecting pregnancy, antepartum Discussed TOLAC vs. RCS  3. Maternal obesity affecting pregnancy, antepartum Body mass index is 49.49 kg/m.   Preterm labor symptoms and general obstetric precautions including but not limited to vaginal bleeding, contractions, leaking of fluid and fetal  movement were reviewed in detail with the patient. Please refer to After Visit Summary for other counseling recommendations.   Return in about 4 weeks (around 03/08/2021).  Future Appointments  Date Time Provider Department Center  02/16/2021 12:45 PM [facility] NURSE [facility]  02/16/2021  1:00 PM [facility] US1 [facility]    Scheryl Darter, MD

## 2021-02-16 ENCOUNTER — Ambulatory Visit: Payer: Medicaid Other | Attending: Obstetrics and Gynecology

## 2021-02-16 ENCOUNTER — Encounter: Payer: Self-pay | Admitting: *Deleted

## 2021-02-16 ENCOUNTER — Ambulatory Visit: Payer: Medicaid Other | Admitting: *Deleted

## 2021-02-16 ENCOUNTER — Other Ambulatory Visit: Payer: Self-pay

## 2021-02-16 ENCOUNTER — Other Ambulatory Visit: Payer: Self-pay | Admitting: *Deleted

## 2021-02-16 VITALS — BP 121/76 | HR 92

## 2021-02-16 DIAGNOSIS — O34219 Maternal care for unspecified type scar from previous cesarean delivery: Secondary | ICD-10-CM

## 2021-02-16 DIAGNOSIS — O09892 Supervision of other high risk pregnancies, second trimester: Secondary | ICD-10-CM | POA: Diagnosis not present

## 2021-02-16 DIAGNOSIS — O99212 Obesity complicating pregnancy, second trimester: Secondary | ICD-10-CM | POA: Insufficient documentation

## 2021-02-16 DIAGNOSIS — O09299 Supervision of pregnancy with other poor reproductive or obstetric history, unspecified trimester: Secondary | ICD-10-CM | POA: Insufficient documentation

## 2021-02-16 DIAGNOSIS — Z3481 Encounter for supervision of other normal pregnancy, first trimester: Secondary | ICD-10-CM | POA: Diagnosis present

## 2021-02-16 DIAGNOSIS — Z363 Encounter for antenatal screening for malformations: Secondary | ICD-10-CM | POA: Insufficient documentation

## 2021-02-16 DIAGNOSIS — Z362 Encounter for other antenatal screening follow-up: Secondary | ICD-10-CM

## 2021-02-16 DIAGNOSIS — Z3A20 20 weeks gestation of pregnancy: Secondary | ICD-10-CM | POA: Diagnosis not present

## 2021-03-09 ENCOUNTER — Encounter: Payer: Self-pay | Admitting: Obstetrics & Gynecology

## 2021-03-09 ENCOUNTER — Ambulatory Visit (INDEPENDENT_AMBULATORY_CARE_PROVIDER_SITE_OTHER): Payer: Medicaid Other | Admitting: Obstetrics & Gynecology

## 2021-03-09 ENCOUNTER — Other Ambulatory Visit: Payer: Self-pay

## 2021-03-09 VITALS — BP 124/80 | HR 87 | Wt 319.0 lb

## 2021-03-09 DIAGNOSIS — O34219 Maternal care for unspecified type scar from previous cesarean delivery: Secondary | ICD-10-CM

## 2021-03-09 DIAGNOSIS — Z3480 Encounter for supervision of other normal pregnancy, unspecified trimester: Secondary | ICD-10-CM

## 2021-03-09 DIAGNOSIS — O99210 Obesity complicating pregnancy, unspecified trimester: Secondary | ICD-10-CM

## 2021-03-09 NOTE — Progress Notes (Signed)
   PRENATAL VISIT NOTE  Subjective:  Carmen Price is a 30 y.o. G2P1001 at [redacted]w[redacted]d being seen today for ongoing prenatal care.  She is currently monitored for the following issues for this high-risk pregnancy and has Supervision of normal first pregnancy, antepartum; Maternal obesity affecting pregnancy, antepartum; Supervision of other normal pregnancy, antepartum; and Previous cesarean delivery affecting pregnancy, antepartum on their problem list.  Patient reports no complaints.  Contractions: Not present. Vag. Bleeding: None.  Movement: Present. Denies leaking of fluid.   The following portions of the patient's history were reviewed and updated as appropriate: allergies, current medications, past family history, past medical history, past social history, past surgical history and problem list.   Objective:   Vitals:   03/09/21 0916  BP: 124/80  Pulse: 87  Weight: (!) 319 lb (144.7 kg)    Fetal Status: Fetal Heart Rate (bpm): 142   Movement: Present     General:  Alert, oriented and cooperative. Patient is in no acute distress.  Skin: Skin is warm and dry. No rash noted.   Cardiovascular: Normal heart rate noted  Respiratory: Normal respiratory effort, no problems with respiration noted  Abdomen: Soft, gravid, appropriate for gestational age.  Pain/Pressure: Absent     Pelvic: Cervical exam deferred        Extremities: Normal range of motion.  Edema: None  Mental Status: Normal mood and affect. Normal behavior. Normal judgment and thought content.   Assessment and Plan:  Pregnancy: G2P1001 at [redacted]w[redacted]d 1. Supervision of other normal pregnancy, antepartum BP normal  2. Previous cesarean delivery affecting pregnancy, antepartum Reviewed op note  3. Maternal obesity affecting pregnancy, antepartum f/u US next week  Preterm labor symptoms and general obstetric precautions including but not limited to vaginal bleeding, contractions, leaking of fluid and fetal movement were  reviewed in detail with the patient. Please refer to After Visit Summary for other counseling recommendations.   Return in about 4 weeks (around 04/06/2021) for 2 hr GTT.  Future Appointments  Date Time Provider Department Center  03/17/2021  3:30 PM [facility] NURSE [facility]  03/17/2021  3:45 PM [facility] US1 [facility]    Scheryl Darter, MD

## 2021-03-09 NOTE — Progress Notes (Signed)
ROB 23w  CC: none

## 2021-03-17 ENCOUNTER — Ambulatory Visit: Payer: Medicaid Other | Admitting: *Deleted

## 2021-03-17 ENCOUNTER — Other Ambulatory Visit: Payer: Self-pay

## 2021-03-17 ENCOUNTER — Encounter: Payer: Self-pay | Admitting: *Deleted

## 2021-03-17 ENCOUNTER — Ambulatory Visit: Payer: Medicaid Other | Attending: Maternal & Fetal Medicine

## 2021-03-17 VITALS — BP 124/72 | HR 84

## 2021-03-17 DIAGNOSIS — O09299 Supervision of pregnancy with other poor reproductive or obstetric history, unspecified trimester: Secondary | ICD-10-CM | POA: Insufficient documentation

## 2021-03-17 DIAGNOSIS — O99212 Obesity complicating pregnancy, second trimester: Secondary | ICD-10-CM | POA: Diagnosis not present

## 2021-03-17 DIAGNOSIS — O34219 Maternal care for unspecified type scar from previous cesarean delivery: Secondary | ICD-10-CM

## 2021-03-17 DIAGNOSIS — Z3480 Encounter for supervision of other normal pregnancy, unspecified trimester: Secondary | ICD-10-CM | POA: Diagnosis present

## 2021-03-17 DIAGNOSIS — O09892 Supervision of other high risk pregnancies, second trimester: Secondary | ICD-10-CM | POA: Insufficient documentation

## 2021-03-17 DIAGNOSIS — Z362 Encounter for other antenatal screening follow-up: Secondary | ICD-10-CM | POA: Insufficient documentation

## 2021-03-17 DIAGNOSIS — Z3A24 24 weeks gestation of pregnancy: Secondary | ICD-10-CM | POA: Insufficient documentation

## 2021-03-17 DIAGNOSIS — E669 Obesity, unspecified: Secondary | ICD-10-CM

## 2021-03-18 ENCOUNTER — Other Ambulatory Visit: Payer: Self-pay | Admitting: *Deleted

## 2021-03-18 DIAGNOSIS — R638 Other symptoms and signs concerning food and fluid intake: Secondary | ICD-10-CM

## 2021-04-06 ENCOUNTER — Other Ambulatory Visit: Payer: Medicaid Other

## 2021-04-06 ENCOUNTER — Ambulatory Visit (INDEPENDENT_AMBULATORY_CARE_PROVIDER_SITE_OTHER): Payer: Medicaid Other | Admitting: Family Medicine

## 2021-04-06 ENCOUNTER — Encounter: Payer: Self-pay | Admitting: Family Medicine

## 2021-04-06 ENCOUNTER — Other Ambulatory Visit: Payer: Self-pay

## 2021-04-06 DIAGNOSIS — Z3480 Encounter for supervision of other normal pregnancy, unspecified trimester: Secondary | ICD-10-CM | POA: Diagnosis not present

## 2021-04-06 DIAGNOSIS — Z23 Encounter for immunization: Secondary | ICD-10-CM | POA: Diagnosis not present

## 2021-04-06 DIAGNOSIS — Z3A27 27 weeks gestation of pregnancy: Secondary | ICD-10-CM

## 2021-04-06 DIAGNOSIS — O99210 Obesity complicating pregnancy, unspecified trimester: Secondary | ICD-10-CM

## 2021-04-06 DIAGNOSIS — O34219 Maternal care for unspecified type scar from previous cesarean delivery: Secondary | ICD-10-CM

## 2021-04-06 NOTE — Progress Notes (Signed)
   PRENATAL VISIT NOTE  Subjective:  Carmen Price is a 30 y.o. G2P1001 at [redacted]w[redacted]d being seen today for ongoing prenatal care.  She is currently monitored for the following issues for this high-risk pregnancy and has Maternal obesity affecting pregnancy, antepartum; Supervision of other normal pregnancy, antepartum; and Previous cesarean delivery affecting pregnancy, antepartum on their problem list.  Patient reports no complaints.  Contractions: Not present. Vag. Bleeding: None.  Movement: Present. Denies leaking of fluid.   The following portions of the patient's history were reviewed and updated as appropriate: allergies, current medications, past family history, past medical history, past social history, past surgical history and problem list.   Objective:   Vitals:   04/06/21 0940  BP: 123/78  Pulse: 84  Weight: (!) 321 lb (145.6 kg)    Fetal Status: Fetal Heart Rate (bpm): 150 Fundal Height: 28 cm Movement: Present     General:  Alert, oriented and cooperative. Patient is in no acute distress.  Skin: Skin is warm and dry. No rash noted.   Cardiovascular: Normal heart rate noted.  Respiratory: Normal respiratory effort, no problems with respiration noted.  Abdomen: Soft, gravid, appropriate for gestational age.  Pain/Pressure: Absent.     Pelvic: Cervical exam deferred.        Extremities: Normal range of motion.  Edema: None.  Mental Status: Normal mood and affect. Normal behavior. Normal judgment and thought content.   Assessment and Plan:  Pregnancy: G2P1001 at [redacted]w[redacted]d  1. Supervision of other normal pregnancy, antepartum 2. [redacted] weeks gestation of pregnancy Progressing well, no concerns today. FHT and FH within normal limits. 28 week labs ordered - will follow up results. Tdap given without complication. Follow up in 2 weeks for next prenatal visit. - Glucose Tolerance, 2 Hours w/1 Hour - RPR - CBC - HIV Antibody (routine testing w rflx) - Tdap vaccine greater than or  equal to 7yo IM  3. Previous cesarean delivery affecting pregnancy, antepartum Reviewed risks/benefits of TOLAC versus repeat cesarean section. Patient elects to [facility] and verbalized understanding of risks. Consented reviewed with patient and signed. Will scan into patient's chart.   4. Maternal obesity affecting pregnancy, antepartum On ASA 81 mg daily. Last growth [place] on 9/14; echogenic intracardiac focus noted. Amniocentesis declined. Low risk NIPS. Follow up US scheduled for 04/16/21.   Preterm labor symptoms and general obstetric precautions including but not limited to vaginal bleeding, contractions, leaking of fluid and fetal movement were reviewed in detail with the patient.  Please refer to After Visit Summary for other counseling recommendations.   Return in about 2 weeks (around 04/20/2021) for follow up HROB visit.  Future Appointments  Date Time Provider Department Center  04/16/2021  3:30 PM [facility] NURSE [facility]  04/16/2021  3:45 PM [facility] US5 [facility]    Worthy Rancher, MD

## 2021-04-07 LAB — GLUCOSE TOLERANCE, 2 HOURS W/ 1HR
Glucose, 1 hour: 81 mg/dL (ref 65–179)
Glucose, 2 hour: 77 mg/dL (ref 65–152)
Glucose, Fasting: 75 mg/dL (ref 65–91)

## 2021-04-07 LAB — CBC
Hematocrit: 33.5 % — ABNORMAL LOW (ref 34.0–46.6)
Hemoglobin: 11.3 g/dL (ref 11.1–15.9)
MCH: 31.1 pg (ref 26.6–33.0)
MCHC: 33.7 g/dL (ref 31.5–35.7)
MCV: 92 fL (ref 79–97)
Platelets: 222 10*3/uL (ref 150–450)
RBC: 3.63 x10E6/uL — ABNORMAL LOW (ref 3.77–5.28)
RDW: 12.8 % (ref 11.7–15.4)
WBC: 5.8 10*3/uL (ref 3.4–10.8)

## 2021-04-07 LAB — HIV ANTIBODY (ROUTINE TESTING W REFLEX): HIV Screen 4th Generation wRfx: NONREACTIVE

## 2021-04-07 LAB — RPR: RPR Ser Ql: NONREACTIVE

## 2021-04-16 ENCOUNTER — Other Ambulatory Visit: Payer: Self-pay

## 2021-04-16 ENCOUNTER — Ambulatory Visit: Payer: Medicaid Other | Admitting: *Deleted

## 2021-04-16 ENCOUNTER — Encounter: Payer: Self-pay | Admitting: *Deleted

## 2021-04-16 ENCOUNTER — Ambulatory Visit: Payer: Medicaid Other | Attending: Obstetrics

## 2021-04-16 VITALS — BP 131/67

## 2021-04-16 DIAGNOSIS — R638 Other symptoms and signs concerning food and fluid intake: Secondary | ICD-10-CM | POA: Diagnosis not present

## 2021-04-16 DIAGNOSIS — O09893 Supervision of other high risk pregnancies, third trimester: Secondary | ICD-10-CM | POA: Diagnosis not present

## 2021-04-16 DIAGNOSIS — Z3480 Encounter for supervision of other normal pregnancy, unspecified trimester: Secondary | ICD-10-CM | POA: Diagnosis present

## 2021-04-16 DIAGNOSIS — O99213 Obesity complicating pregnancy, third trimester: Secondary | ICD-10-CM | POA: Diagnosis not present

## 2021-04-16 DIAGNOSIS — E669 Obesity, unspecified: Secondary | ICD-10-CM

## 2021-04-16 DIAGNOSIS — Z362 Encounter for other antenatal screening follow-up: Secondary | ICD-10-CM | POA: Diagnosis not present

## 2021-04-16 DIAGNOSIS — O34219 Maternal care for unspecified type scar from previous cesarean delivery: Secondary | ICD-10-CM | POA: Insufficient documentation

## 2021-04-16 DIAGNOSIS — Z3A28 28 weeks gestation of pregnancy: Secondary | ICD-10-CM

## 2021-04-19 ENCOUNTER — Other Ambulatory Visit: Payer: Self-pay | Admitting: *Deleted

## 2021-04-19 DIAGNOSIS — Z6841 Body Mass Index (BMI) 40.0 and over, adult: Secondary | ICD-10-CM

## 2021-04-20 ENCOUNTER — Ambulatory Visit (INDEPENDENT_AMBULATORY_CARE_PROVIDER_SITE_OTHER): Payer: Medicaid Other | Admitting: Obstetrics and Gynecology

## 2021-04-20 ENCOUNTER — Other Ambulatory Visit: Payer: Self-pay

## 2021-04-20 ENCOUNTER — Encounter: Payer: Self-pay | Admitting: Obstetrics and Gynecology

## 2021-04-20 VITALS — BP 120/81 | HR 111 | Wt 319.0 lb

## 2021-04-20 DIAGNOSIS — Z3480 Encounter for supervision of other normal pregnancy, unspecified trimester: Secondary | ICD-10-CM

## 2021-04-20 DIAGNOSIS — O99210 Obesity complicating pregnancy, unspecified trimester: Secondary | ICD-10-CM

## 2021-04-20 DIAGNOSIS — Z3A29 29 weeks gestation of pregnancy: Secondary | ICD-10-CM | POA: Insufficient documentation

## 2021-04-20 DIAGNOSIS — Z6841 Body Mass Index (BMI) 40.0 and over, adult: Secondary | ICD-10-CM | POA: Insufficient documentation

## 2021-04-20 DIAGNOSIS — O34219 Maternal care for unspecified type scar from previous cesarean delivery: Secondary | ICD-10-CM

## 2021-04-20 NOTE — Progress Notes (Signed)
   PRENATAL VISIT NOTE  Subjective:  Carmen Price is a 30 y.o. G2P1001 at [redacted]w[redacted]d being seen today for ongoing prenatal care.  She is currently monitored for the following issues for this high-risk pregnancy and has Maternal obesity affecting pregnancy, antepartum; Supervision of other normal pregnancy, antepartum; Previous cesarean delivery affecting pregnancy, antepartum; [redacted] weeks gestation of pregnancy; and BMI 45.0-49.9, adult (HCC) on their problem list.  Patient doing well with no acute concerns today. She reports no complaints.  Contractions: Not present. Vag. Bleeding: None.  Movement: Present. Denies leaking of fluid.   The following portions of the patient's history were reviewed and updated as appropriate: allergies, current medications, past family history, past medical history, past social history, past surgical history and problem list. Problem list updated.  Objective:   Vitals:   04/20/21 1414 04/20/21 1417  BP:  120/81  Pulse:  (!) 111  Weight: (!) 319 lb (144.7 kg) (!) 319 lb (144.7 kg)    Fetal Status: Fetal Heart Rate (bpm): 153   Movement: Present     General:  Alert, oriented and cooperative. Patient is in no acute distress.  Skin: Skin is warm and dry. No rash noted.   Cardiovascular: Normal heart rate noted  Respiratory: Normal respiratory effort, no problems with respiration noted  Abdomen: Soft, gravid, appropriate for gestational age.  Pain/Pressure: Present     Pelvic: Cervical exam deferred        Extremities: Normal range of motion.  Edema: None  Mental Status:  Normal mood and affect. Normal behavior. Normal judgment and thought content.   Assessment and Plan:  Pregnancy: G2P1001 at [redacted]w[redacted]d  1. [redacted] weeks gestation of pregnancy   2. Supervision of other normal pregnancy, antepartum Continue routine care, growth and BPP at 36 weeks  3. Previous cesarean delivery affecting pregnancy, antepartum Pt desires TOLAC, consent previously signed  4.  Maternal obesity affecting pregnancy, antepartum   5. BMI 45.0-49.9, adult (HCC) Weight is stable  Preterm labor symptoms and general obstetric precautions including but not limited to vaginal bleeding, contractions, leaking of fluid and fetal movement were reviewed in detail with the patient.  Please refer to After Visit Summary for other counseling recommendations.   Return in about 2 weeks (around 05/04/2021) for [facility], in person.   Mariel Aloe, MD Faculty Attending Center for [email or web address]

## 2021-05-04 ENCOUNTER — Telehealth (INDEPENDENT_AMBULATORY_CARE_PROVIDER_SITE_OTHER): Payer: Medicaid Other | Admitting: Obstetrics and Gynecology

## 2021-05-04 ENCOUNTER — Other Ambulatory Visit: Payer: Self-pay

## 2021-05-04 ENCOUNTER — Encounter: Payer: Self-pay | Admitting: Obstetrics and Gynecology

## 2021-05-04 VITALS — Wt 319.0 lb

## 2021-05-04 DIAGNOSIS — O34219 Maternal care for unspecified type scar from previous cesarean delivery: Secondary | ICD-10-CM

## 2021-05-04 DIAGNOSIS — Z3A31 31 weeks gestation of pregnancy: Secondary | ICD-10-CM

## 2021-05-04 DIAGNOSIS — Z3480 Encounter for supervision of other normal pregnancy, unspecified trimester: Secondary | ICD-10-CM

## 2021-05-04 NOTE — Patient Instructions (Signed)

## 2021-05-04 NOTE — Progress Notes (Signed)
OBSTETRICS PRENATAL VIRTUAL VISIT ENCOUNTER NOTE  Provider location: [facility] for [facility]   Patient location: Home  I connected with Carmen Price on 05/04/21 at  1:30 PM EDT by MyChart Video Encounter and verified that I am speaking with the correct person using two identifiers. I discussed the limitations, risks, security and privacy concerns of performing an evaluation and management service virtually and the availability of in person appointments. I also discussed with the patient that there may be a patient responsible charge related to this service. The patient expressed understanding and agreed to proceed. Subjective:  Carmen Price is a 30 y.o. G2P1001 at [redacted]w[redacted]d being seen today for ongoing prenatal care.  She is currently monitored for the following issues for this low-risk pregnancy and has Maternal obesity affecting pregnancy, antepartum; Supervision of other normal pregnancy, antepartum; Previous cesarean delivery affecting pregnancy, antepartum; and BMI 45.0-49.9, adult (HCC) on their problem list.  Patient reports no complaints.  Contractions: Not present. Vag. Bleeding: None.  Movement: Present. Denies any leaking of fluid.   The following portions of the patient's history were reviewed and updated as appropriate: allergies, current medications, past family history, past medical history, past social history, past surgical history and problem list.   Objective:   Vitals:   05/04/21 1350  Weight: (!) 319 lb (144.7 kg)    Fetal Status:     Movement: Present     General:  Alert, oriented and cooperative. Patient is in no acute distress.  Respiratory: Normal respiratory effort, no problems with respiration noted  Mental Status: Normal mood and affect. Normal behavior. Normal judgment and thought content.  Rest of physical exam deferred due to type of encounter  Imaging: [place] MFM OB FOLLOW UP  Result Date:  04/16/2021 ----------------------------------------------------------------------  OBSTETRICS REPORT                       (Signed Final 04/16/2021 02:46 pm) ---------------------------------------------------------------------- Patient Info  ID #:       [id number]                          D.O.B.:  05-Jul-1990 (30 yrs)  Name:       Carmen Price               Visit Date: 04/16/2021 01:38 pm ---------------------------------------------------------------------- Performed By  Attending:        Ma Rings MD         Ref. Address:     [address]                                                             [phone]  [place]                                                             [address]  Performed By:     Reinaldo Raddle            [facility]  Referred By:      [facility] ---------------------------------------------------------------------- Orders  #  Description                           Code        Ordered By  1  [place] MFM OB FOLLOW UP                   [phone]    Rosana Hoes ----------------------------------------------------------------------  #  Order #                     Accession #                Episode #  1  [id number]                   [id number]                 [id number] ---------------------------------------------------------------------- Indications  Obesity complicating pregnancy, second         O99.212  trimester (BMI 43)  Short interval between pregancies, 2nd         O09.892  trimester (06/2020)  Poor obstetric history: Previous               O09.299  preeclampsia / eclampsia/gestational HTN  History of cesarean delivery, currently         O34.219  pregnant  Encounter for other antenatal screening        Z36.2  follow-up  [redacted] weeks gestation of pregnancy                Z3A.28 ---------------------------------------------------------------------- Fetal Evaluation  Num Of Fetuses:         1  Fetal Heart Rate(bpm):  143  Cardiac Activity:       Observed  Presentation:  Cephalic  Placenta:               Posterior Fundal  P. Cord Insertion:      Previously Visualized  Amniotic Fluid  AFI FV:      Within normal limits  AFI Sum(cm)     %Tile       Largest Pocket(cm)  13.21           38          5.34  RUQ(cm)       RLQ(cm)       LUQ(cm)        LLQ(cm)  2.92          1.01          3.94           5.34 ---------------------------------------------------------------------- Biometry  BPD:        72  mm     G. Age:  28w 6d         53  %    CI:        72.25   %    70 - 86                                                          FL/HC:      18.7   %    18.8 - 20.6  HC:      269.5  mm     G. Age:  29w 3d         47  %    HC/AC:      1.14        1.05 - 1.21  AC:      236.5  mm     G. Age:  28w 0d         29  %    FL/BPD:     70.1   %    71 - 87  FL:       50.5  mm     G. Age:  27w 1d          8  %    FL/AC:      21.4   %    20 - 24  LV:        1.4  mm  Est. FW:    1139  gm      2 lb 8 oz     19  % ---------------------------------------------------------------------- OB History  Gravidity:    2         Term:   1        Prem:   0        SAB:   0  TOP:          0       Ectopic:  0        Living: 1 ---------------------------------------------------------------------- Gestational Age  U/S Today:     28w 3d                                        EDD:   07/06/21  Best:          28w 3d     Det.  By:  Previous Ultrasound      EDD:   07/06/21                                      (11/29/20) ---------------------------------------------------------------------- Anatomy  Cranium:               Appears normal         Aortic Arch:            Not well visualized  Cavum:                  Appears normal         Ductal Arch:            Not well visualized  Ventricles:            Previously seen        Diaphragm:              Appears normal  Choroid Plexus:        Previously seen        Stomach:                Appears normal, left                                                                        sided  Cerebellum:            Previously seen        Abdomen:                Appears normal  Posterior Fossa:       Previously seen        Abdominal Wall:         Previously seen  Nuchal Fold:           Not applicable (>20    Cord Vessels:           Previously seen                         wks GA)  Face:                  Orbits and profile     Kidneys:                Appear normal                         previously seen  Lips:                  Previously seen        Bladder:                Appears normal  Heart:                 Previously seen, EIF   Spine:                  Ltd views no  intracranial signs of                                                                        NTD  RVOT:                  Previously seen        Upper Extremities:      Previously seen  LVOT:                  Previously seen        Lower Extremities:      Previously seen  Other:  Female gender previously seen. 3VV and 3VTV prev visualized.          Technically difficult due to maternal habitus. ---------------------------------------------------------------------- Cervix Uterus Adnexa  Cervix  Not visualized (advanced GA >24wks) ---------------------------------------------------------------------- Comments  This patient was seen for a follow up growth scan due to  maternal obesity with a BMI of 44.  She denies any problems  since her last exam.  She was informed that the fetal growth and amniotic fluid  level appears appropriate for her gestational age.  She was informed that the views of the fetal anatomy remain  suboptimal today due to the  fetal position and maternal body  habitus.  She understands that there is a good possibility that  we may not be able to clear all of the views of the fetal  anatomy during her prenatal ultrasounds.  She should have  her baby examined after birth to determine if any  abnormalities are present.  A follow-up growth scan was scheduled in 4 weeks.  Due to  maternal obesity, we will also start weekly fetal testing at that  time. ----------------------------------------------------------------------                   Ma Rings, MD Electronically Signed Final Report   04/16/2021 02:46 pm ----------------------------------------------------------------------   Assessment and Plan:  Pregnancy: G2P1001 at [redacted]w[redacted]d 1. Supervision of other normal pregnancy, antepartum Stable Serial growth scans and antenatal testing as per MFM  2. Previous cesarean delivery affecting pregnancy, antepartum Stable For TOLAC, consented  Preterm labor symptoms and general obstetric precautions including but not limited to vaginal bleeding, contractions, leaking of fluid and fetal movement were reviewed in detail with the patient. I discussed the assessment and treatment plan with the patient. The patient was provided an opportunity to ask questions and all were answered. The patient agreed with the plan and demonstrated an understanding of the instructions. The patient was advised to call back or seek an in-person office evaluation/go to [facility] at [facility] for any urgent or concerning symptoms. Please refer to After Visit Summary for other counseling recommendations.   I provided 8 minutes of face-to-face time during this encounter.  Return in about 2 weeks (around 05/18/2021) for OB visit, face to face, any provider.  Future Appointments  Date Time Provider Department Center  05/14/2021  1:30 PM [facility] NURSE [facility]  05/14/2021  1:45 PM [facility] US5 [facility]  05/21/2021  1:30 PM [facility] NURSE  [facility]  05/21/2021  1:45 PM [facility] US4 [facility]    Hermina Staggers, MD Center for [employer]

## 2021-05-04 NOTE — Progress Notes (Signed)
+   Fetal movement. No complaints. Pt did not have BP cuff available to take blood pressure.

## 2021-05-10 ENCOUNTER — Ambulatory Visit (HOSPITAL_BASED_OUTPATIENT_CLINIC_OR_DEPARTMENT_OTHER): Payer: Medicaid Other

## 2021-05-10 ENCOUNTER — Other Ambulatory Visit: Payer: Self-pay | Admitting: *Deleted

## 2021-05-10 ENCOUNTER — Ambulatory Visit: Payer: Medicaid Other | Attending: Obstetrics | Admitting: *Deleted

## 2021-05-10 ENCOUNTER — Other Ambulatory Visit: Payer: Self-pay

## 2021-05-10 VITALS — BP 122/58 | HR 99

## 2021-05-10 DIAGNOSIS — O99213 Obesity complicating pregnancy, third trimester: Secondary | ICD-10-CM

## 2021-05-10 DIAGNOSIS — O09293 Supervision of pregnancy with other poor reproductive or obstetric history, third trimester: Secondary | ICD-10-CM | POA: Diagnosis not present

## 2021-05-10 DIAGNOSIS — E669 Obesity, unspecified: Secondary | ICD-10-CM

## 2021-05-10 DIAGNOSIS — Z3A31 31 weeks gestation of pregnancy: Secondary | ICD-10-CM | POA: Diagnosis not present

## 2021-05-10 DIAGNOSIS — Z6841 Body Mass Index (BMI) 40.0 and over, adult: Secondary | ICD-10-CM

## 2021-05-10 DIAGNOSIS — Z3480 Encounter for supervision of other normal pregnancy, unspecified trimester: Secondary | ICD-10-CM

## 2021-05-10 DIAGNOSIS — O09893 Supervision of other high risk pregnancies, third trimester: Secondary | ICD-10-CM | POA: Diagnosis not present

## 2021-05-10 DIAGNOSIS — O34219 Maternal care for unspecified type scar from previous cesarean delivery: Secondary | ICD-10-CM

## 2021-05-14 ENCOUNTER — Ambulatory Visit: Payer: Medicaid Other

## 2021-05-18 ENCOUNTER — Ambulatory Visit (INDEPENDENT_AMBULATORY_CARE_PROVIDER_SITE_OTHER): Payer: Medicaid Other | Admitting: Obstetrics and Gynecology

## 2021-05-18 ENCOUNTER — Encounter: Payer: Self-pay | Admitting: Obstetrics and Gynecology

## 2021-05-18 ENCOUNTER — Other Ambulatory Visit: Payer: Self-pay

## 2021-05-18 VITALS — BP 131/85 | HR 79 | Wt 317.0 lb

## 2021-05-18 DIAGNOSIS — Z3480 Encounter for supervision of other normal pregnancy, unspecified trimester: Secondary | ICD-10-CM

## 2021-05-18 DIAGNOSIS — O99210 Obesity complicating pregnancy, unspecified trimester: Secondary | ICD-10-CM

## 2021-05-18 DIAGNOSIS — O34219 Maternal care for unspecified type scar from previous cesarean delivery: Secondary | ICD-10-CM

## 2021-05-18 NOTE — Progress Notes (Signed)
   PRENATAL VISIT NOTE  Subjective:  Carmen Price is a 30 y.o. G2P1001 at [redacted]w[redacted]d being seen today for ongoing prenatal care.  She is currently monitored for the following issues for this high-risk pregnancy and has Maternal obesity affecting pregnancy, antepartum; Supervision of other normal pregnancy, antepartum; Previous cesarean delivery affecting pregnancy, antepartum; and BMI 45.0-49.9, adult (HCC) on their problem list.  Patient reports no complaints.  Contractions: Not present. Vag. Bleeding: None.  Movement: Present. Denies leaking of fluid.   The following portions of the patient's history were reviewed and updated as appropriate: allergies, current medications, past family history, past medical history, past social history, past surgical history and problem list.   Objective:   Vitals:   05/18/21 1429  BP: (!) 151/98  Pulse: 91  Weight: (!) 317 lb (143.8 kg)    Fetal Status: Fetal Heart Rate (bpm): 143 Fundal Height: 34 cm Movement: Present     General:  Alert, oriented and cooperative. Patient is in no acute distress.  Skin: Skin is warm and dry. No rash noted.   Cardiovascular: Normal heart rate noted  Respiratory: Normal respiratory effort, no problems with respiration noted  Abdomen: Soft, gravid, appropriate for gestational age.  Pain/Pressure: Absent     Pelvic: Cervical exam deferred        Extremities: Normal range of motion.  Edema: None  Mental Status: Normal mood and affect. Normal behavior. Normal judgment and thought content.   Assessment and Plan:  Pregnancy: G2P1001 at [redacted]w[redacted]d 1. Supervision of other normal pregnancy, antepartum Patient is doing well without complaints Plans nexplanon for contraception  2. Previous cesarean delivery affecting pregnancy, antepartum Desires TOLAC  3. Maternal obesity affecting pregnancy, antepartum Continue ASA Continue antenatal testing and growth ultrasound per MFM   Preterm labor symptoms and general obstetric  precautions including but not limited to vaginal bleeding, contractions, leaking of fluid and fetal movement were reviewed in detail with the patient. Please refer to After Visit Summary for other counseling recommendations.   Return in about 2 weeks (around 06/01/2021) for in person, ROB, High risk.  Future Appointments  Date Time Provider Department Center  05/18/2021  2:50 PM Azoria Abbett, MD [facility] None  05/21/2021  1:30 PM [facility] NURSE [facility]  05/21/2021  1:45 PM [facility] US4 [facility]  05/25/2021  1:45 PM [facility] NURSE [facility]  05/25/2021  2:00 PM [facility] US1 [facility]  06/02/2021  1:15 PM [facility]  06/10/2021  1:30 PM [facility] NURSE [facility]  06/10/2021  1:45 PM [facility] US6 [facility]    Kyros Salzwedel, MD

## 2021-05-21 ENCOUNTER — Other Ambulatory Visit: Payer: Self-pay

## 2021-05-21 ENCOUNTER — Encounter: Payer: Self-pay | Admitting: *Deleted

## 2021-05-21 ENCOUNTER — Ambulatory Visit: Payer: Medicaid Other | Attending: Obstetrics

## 2021-05-21 ENCOUNTER — Ambulatory Visit: Payer: Medicaid Other | Admitting: *Deleted

## 2021-05-21 VITALS — BP 126/67 | HR 109

## 2021-05-21 DIAGNOSIS — Z6841 Body Mass Index (BMI) 40.0 and over, adult: Secondary | ICD-10-CM

## 2021-05-21 DIAGNOSIS — Z3A33 33 weeks gestation of pregnancy: Secondary | ICD-10-CM | POA: Insufficient documentation

## 2021-05-21 DIAGNOSIS — O09293 Supervision of pregnancy with other poor reproductive or obstetric history, third trimester: Secondary | ICD-10-CM

## 2021-05-21 DIAGNOSIS — O09893 Supervision of other high risk pregnancies, third trimester: Secondary | ICD-10-CM | POA: Insufficient documentation

## 2021-05-21 DIAGNOSIS — O99213 Obesity complicating pregnancy, third trimester: Secondary | ICD-10-CM | POA: Insufficient documentation

## 2021-05-21 DIAGNOSIS — O34219 Maternal care for unspecified type scar from previous cesarean delivery: Secondary | ICD-10-CM | POA: Insufficient documentation

## 2021-05-21 DIAGNOSIS — Z3480 Encounter for supervision of other normal pregnancy, unspecified trimester: Secondary | ICD-10-CM | POA: Diagnosis present

## 2021-05-24 ENCOUNTER — Telehealth: Payer: Self-pay

## 2021-05-24 NOTE — Telephone Encounter (Signed)
Mar/LM for patient (requesting pt to arrive@1230  (instead of 145p) for 11/22.

## 2021-05-25 ENCOUNTER — Encounter: Payer: Self-pay | Admitting: *Deleted

## 2021-05-25 ENCOUNTER — Ambulatory Visit: Payer: Medicaid Other | Attending: Obstetrics

## 2021-05-25 ENCOUNTER — Ambulatory Visit: Payer: Medicaid Other | Admitting: *Deleted

## 2021-05-25 ENCOUNTER — Other Ambulatory Visit: Payer: Self-pay

## 2021-05-25 VITALS — BP 122/64 | HR 95

## 2021-05-25 DIAGNOSIS — Z3A34 34 weeks gestation of pregnancy: Secondary | ICD-10-CM | POA: Diagnosis not present

## 2021-05-25 DIAGNOSIS — O34219 Maternal care for unspecified type scar from previous cesarean delivery: Secondary | ICD-10-CM

## 2021-05-25 DIAGNOSIS — O09293 Supervision of pregnancy with other poor reproductive or obstetric history, third trimester: Secondary | ICD-10-CM | POA: Insufficient documentation

## 2021-05-25 DIAGNOSIS — O09893 Supervision of other high risk pregnancies, third trimester: Secondary | ICD-10-CM | POA: Insufficient documentation

## 2021-05-25 DIAGNOSIS — Z6841 Body Mass Index (BMI) 40.0 and over, adult: Secondary | ICD-10-CM | POA: Diagnosis present

## 2021-05-25 DIAGNOSIS — E669 Obesity, unspecified: Secondary | ICD-10-CM

## 2021-05-25 DIAGNOSIS — O99213 Obesity complicating pregnancy, third trimester: Secondary | ICD-10-CM | POA: Insufficient documentation

## 2021-05-25 DIAGNOSIS — Z3480 Encounter for supervision of other normal pregnancy, unspecified trimester: Secondary | ICD-10-CM | POA: Diagnosis present

## 2021-05-31 ENCOUNTER — Other Ambulatory Visit: Payer: Medicaid Other

## 2021-06-01 ENCOUNTER — Encounter: Payer: Self-pay | Admitting: Obstetrics and Gynecology

## 2021-06-01 ENCOUNTER — Ambulatory Visit (INDEPENDENT_AMBULATORY_CARE_PROVIDER_SITE_OTHER): Payer: Medicaid Other | Admitting: Obstetrics and Gynecology

## 2021-06-01 ENCOUNTER — Other Ambulatory Visit: Payer: Self-pay

## 2021-06-01 VITALS — BP 130/85 | HR 98 | Wt 320.0 lb

## 2021-06-01 DIAGNOSIS — O34219 Maternal care for unspecified type scar from previous cesarean delivery: Secondary | ICD-10-CM

## 2021-06-01 DIAGNOSIS — Z3480 Encounter for supervision of other normal pregnancy, unspecified trimester: Secondary | ICD-10-CM

## 2021-06-01 DIAGNOSIS — O99210 Obesity complicating pregnancy, unspecified trimester: Secondary | ICD-10-CM

## 2021-06-01 NOTE — Progress Notes (Signed)
   PRENATAL VISIT NOTE  Subjective:  Carmen Price is a 30 y.o. G2P1001 at [redacted]w[redacted]d being seen today for ongoing prenatal care.  She is currently monitored for the following issues for this high-risk pregnancy and has Maternal obesity affecting pregnancy, antepartum; Supervision of other normal pregnancy, antepartum; Previous cesarean delivery affecting pregnancy, antepartum; and BMI 45.0-49.9, adult (HCC) on their problem list.  Patient reports no complaints.  Contractions: Not present. Vag. Bleeding: None.  Movement: Present. Denies leaking of fluid.   The following portions of the patient's history were reviewed and updated as appropriate: allergies, current medications, past family history, past medical history, past social history, past surgical history and problem list.   Objective:   Vitals:   06/01/21 1452  BP: 130/85  Pulse: 98  Weight: (!) 320 lb (145.2 kg)    Fetal Status: Fetal Heart Rate (bpm): 140 Fundal Height: 36 cm Movement: Present     General:  Alert, oriented and cooperative. Patient is in no acute distress.  Skin: Skin is warm and dry. No rash noted.   Cardiovascular: Normal heart rate noted  Respiratory: Normal respiratory effort, no problems with respiration noted  Abdomen: Soft, gravid, appropriate for gestational age.  Pain/Pressure: Absent     Pelvic: Cervical exam deferred        Extremities: Normal range of motion.     Mental Status: Normal mood and affect. Normal behavior. Normal judgment and thought content.   Assessment and Plan:  Pregnancy: G2P1001 at [redacted]w[redacted]d 1. Supervision of other normal pregnancy, antepartum Patient is doing well without complaints Cultures next visit  2. Previous cesarean delivery affecting pregnancy, antepartum Plans TOLAC  3. Maternal obesity affecting pregnancy, antepartum Continue ASA Follow up scans and antenatal testing per MFM   Preterm labor symptoms and general obstetric precautions including but not limited to  vaginal bleeding, contractions, leaking of fluid and fetal movement were reviewed in detail with the patient. Please refer to After Visit Summary for other counseling recommendations.   Return in about 1 week (around 06/08/2021) for in person, ROB, High risk.  Future Appointments  Date Time Provider Department Center  06/02/2021  1:15 PM [facility] NST [facility]  06/10/2021  1:30 PM [facility] NURSE [facility]  06/10/2021  1:45 PM [facility] US6 [facility]    Catalina Antigua, MD

## 2021-06-02 ENCOUNTER — Ambulatory Visit (INDEPENDENT_AMBULATORY_CARE_PROVIDER_SITE_OTHER): Payer: Medicaid Other | Admitting: General Practice

## 2021-06-02 ENCOUNTER — Ambulatory Visit: Payer: Self-pay

## 2021-06-02 VITALS — BP 128/69 | HR 93

## 2021-06-02 DIAGNOSIS — O99210 Obesity complicating pregnancy, unspecified trimester: Secondary | ICD-10-CM

## 2021-06-02 NOTE — Progress Notes (Signed)
Pt informed that the ultrasound is considered a limited OB ultrasound and is not intended to be a complete ultrasound exam.  Patient also informed that the ultrasound is not being completed with the intent of assessing for fetal or placental anomalies or any pelvic abnormalities.  Explained that the purpose of today's ultrasound is to assess for  BPP, presentation, and AFI.  Patient acknowledges the purpose of the exam and the limitations of the study.     Chase Caller RN BSN 06/02/21

## 2021-06-08 ENCOUNTER — Ambulatory Visit (INDEPENDENT_AMBULATORY_CARE_PROVIDER_SITE_OTHER): Payer: Medicaid Other | Admitting: Obstetrics & Gynecology

## 2021-06-08 ENCOUNTER — Other Ambulatory Visit: Payer: Self-pay

## 2021-06-08 ENCOUNTER — Other Ambulatory Visit (HOSPITAL_COMMUNITY)
Admission: RE | Admit: 2021-06-08 | Discharge: 2021-06-08 | Disposition: A | Discharge status: Home / Self Care | Payer: Medicaid Other | Source: Ambulatory Visit | Attending: Obstetrics & Gynecology | Admitting: Obstetrics & Gynecology

## 2021-06-08 VITALS — BP 123/82 | HR 91 | Wt 323.2 lb

## 2021-06-08 DIAGNOSIS — Z3480 Encounter for supervision of other normal pregnancy, unspecified trimester: Secondary | ICD-10-CM | POA: Diagnosis present

## 2021-06-08 DIAGNOSIS — O34219 Maternal care for unspecified type scar from previous cesarean delivery: Secondary | ICD-10-CM

## 2021-06-08 DIAGNOSIS — O99210 Obesity complicating pregnancy, unspecified trimester: Secondary | ICD-10-CM

## 2021-06-08 LAB — OB RESULTS CONSOLE GC/CHLAMYDIA: Gonorrhea: NEGATIVE

## 2021-06-08 NOTE — Progress Notes (Signed)
   PRENATAL VISIT NOTE  Subjective:  Carmen Price is a 30 y.o. G2P1001 at [redacted]w[redacted]d being seen today for ongoing prenatal care.  She is currently monitored for the following issues for this high-risk pregnancy and has Maternal obesity affecting pregnancy, antepartum; Supervision of other normal pregnancy, antepartum; Previous cesarean delivery affecting pregnancy, antepartum; and BMI 45.0-49.9, adult (HCC) on their problem list.  Patient reports no complaints.  Contractions: Not present. Vag. Bleeding: None.  Movement: Present. Denies leaking of fluid.   The following portions of the patient's history were reviewed and updated as appropriate: allergies, current medications, past family history, past medical history, past social history, past surgical history and problem list.   Objective:   Vitals:   06/08/21 0938  BP: 123/82  Pulse: 91  Weight: (!) 323 lb 3.2 oz (146.6 kg)    Fetal Status: Fetal Heart Rate (bpm): 141   Movement: Present     General:  Alert, oriented and cooperative. Patient is in no acute distress.  Skin: Skin is warm and dry. No rash noted.   Cardiovascular: Normal heart rate noted  Respiratory: Normal respiratory effort, no problems with respiration noted  Abdomen: Soft, gravid, appropriate for gestational age.  Pain/Pressure: Absent     Pelvic: Cervical exam performed in the presence of a chaperone        Extremities: Normal range of motion.  Edema: None  Mental Status: Normal mood and affect. Normal behavior. Normal judgment and thought content.   Assessment and Plan:  Pregnancy: G2P1001 at [redacted]w[redacted]d 1. Supervision of other normal pregnancy, antepartum F/u growth [place] 12/8  2. Previous cesarean delivery affecting pregnancy, antepartum TOLAC  3. Maternal obesity affecting pregnancy, antepartum Body mass index is 50.62 kg/m.   Preterm labor symptoms and general obstetric precautions including but not limited to vaginal bleeding, contractions, leaking of fluid  and fetal movement were reviewed in detail with the patient. Please refer to After Visit Summary for other counseling recommendations.   Return in about 1 week (around 06/15/2021).  Future Appointments  Date Time Provider Department Center  06/10/2021  1:30 PM [facility] NURSE [facility]  06/10/2021  1:45 PM [facility] US6 [facility]    Scheryl Darter, MD

## 2021-06-08 NOTE — Progress Notes (Signed)
Patient presents for ROB. Patient has no concerns today. 

## 2021-06-09 LAB — CERVICOVAGINAL ANCILLARY ONLY
Chlamydia: NEGATIVE
Comment: NEGATIVE
Comment: NEGATIVE
Comment: NORMAL
Neisseria Gonorrhea: NEGATIVE
Trichomonas: NEGATIVE

## 2021-06-09 NOTE — Progress Notes (Signed)
Attestation of Attending Supervision of clinical support staff: I agree with the care provided to this patient and was available for any consultation.  I have reviewed the RN's note and chart. I was available for consult and to see the patient if needed.   Mercer Stallworth MD MPH Attending Physician Faculty Practice[facility]  

## 2021-06-10 ENCOUNTER — Other Ambulatory Visit: Payer: Self-pay

## 2021-06-10 ENCOUNTER — Ambulatory Visit: Payer: Medicaid Other | Admitting: *Deleted

## 2021-06-10 ENCOUNTER — Ambulatory Visit: Payer: Medicaid Other | Attending: Obstetrics

## 2021-06-10 VITALS — BP 126/62 | HR 89

## 2021-06-10 DIAGNOSIS — O34219 Maternal care for unspecified type scar from previous cesarean delivery: Secondary | ICD-10-CM | POA: Insufficient documentation

## 2021-06-10 DIAGNOSIS — Z3A36 36 weeks gestation of pregnancy: Secondary | ICD-10-CM | POA: Insufficient documentation

## 2021-06-10 DIAGNOSIS — O09893 Supervision of other high risk pregnancies, third trimester: Secondary | ICD-10-CM | POA: Diagnosis not present

## 2021-06-10 DIAGNOSIS — E669 Obesity, unspecified: Secondary | ICD-10-CM | POA: Diagnosis not present

## 2021-06-10 DIAGNOSIS — Z3480 Encounter for supervision of other normal pregnancy, unspecified trimester: Secondary | ICD-10-CM

## 2021-06-10 DIAGNOSIS — O09293 Supervision of pregnancy with other poor reproductive or obstetric history, third trimester: Secondary | ICD-10-CM | POA: Diagnosis not present

## 2021-06-10 DIAGNOSIS — Z6841 Body Mass Index (BMI) 40.0 and over, adult: Secondary | ICD-10-CM | POA: Diagnosis present

## 2021-06-10 DIAGNOSIS — O99213 Obesity complicating pregnancy, third trimester: Secondary | ICD-10-CM | POA: Diagnosis not present

## 2021-06-11 ENCOUNTER — Other Ambulatory Visit: Payer: Self-pay | Admitting: *Deleted

## 2021-06-11 DIAGNOSIS — Z6841 Body Mass Index (BMI) 40.0 and over, adult: Secondary | ICD-10-CM

## 2021-06-11 DIAGNOSIS — O34219 Maternal care for unspecified type scar from previous cesarean delivery: Secondary | ICD-10-CM

## 2021-06-11 DIAGNOSIS — O09899 Supervision of other high risk pregnancies, unspecified trimester: Secondary | ICD-10-CM

## 2021-06-11 DIAGNOSIS — O09299 Supervision of pregnancy with other poor reproductive or obstetric history, unspecified trimester: Secondary | ICD-10-CM

## 2021-06-11 NOTE — Progress Notes (Unsigned)
u

## 2021-06-12 LAB — CULTURE, BETA STREP (GROUP B ONLY): Strep Gp B Culture: NEGATIVE

## 2021-06-15 ENCOUNTER — Other Ambulatory Visit: Payer: Self-pay

## 2021-06-15 ENCOUNTER — Ambulatory Visit (INDEPENDENT_AMBULATORY_CARE_PROVIDER_SITE_OTHER): Payer: Medicaid Other | Admitting: Obstetrics & Gynecology

## 2021-06-15 VITALS — BP 135/90 | HR 111 | Wt 319.6 lb

## 2021-06-15 DIAGNOSIS — Z3480 Encounter for supervision of other normal pregnancy, unspecified trimester: Secondary | ICD-10-CM

## 2021-06-15 DIAGNOSIS — Z8759 Personal history of other complications of pregnancy, childbirth and the puerperium: Secondary | ICD-10-CM | POA: Insufficient documentation

## 2021-06-15 DIAGNOSIS — O34219 Maternal care for unspecified type scar from previous cesarean delivery: Secondary | ICD-10-CM

## 2021-06-15 DIAGNOSIS — O99210 Obesity complicating pregnancy, unspecified trimester: Secondary | ICD-10-CM

## 2021-06-15 NOTE — Progress Notes (Signed)
PRENATAL VISIT NOTE ° °Subjective:  °Carmen Price is a 30 y.o. G2P1001 at [redacted]w[redacted]d being seen today for ongoing prenatal care.  She is currently monitored for the following issues for this high-risk pregnancy and has Maternal obesity affecting pregnancy, antepartum; Supervision of other normal pregnancy, antepartum; Previous cesarean delivery affecting pregnancy, antepartum; BMI 45.0-49.9, adult (HCC); and History of gestational hypertension on their problem list. ° °Patient reports no complaints.  Contractions: Not present. Vag. Bleeding: None.  Movement: Present. Denies leaking of fluid.  ° °The following portions of the patient's history were reviewed and updated as appropriate: allergies, current medications, past family history, past medical history, past social history, past surgical history and problem list.  ° °Objective:  ° °Vitals:  ° 06/15/21 1446 06/15/21 1503  °BP: (!) 142/91 135/90  °Pulse: (!) 104 (!) 111  °Weight: (!) 319 lb 9.6 oz (145 kg)   ° ° °Fetal Status: Fetal Heart Rate (bpm): 133   Movement: Present    ° °General:  Alert, oriented and cooperative. Patient is in no acute distress.  °Skin: Skin is warm and dry. No rash noted.   °Cardiovascular: Normal heart rate noted  °Respiratory: Normal respiratory effort, no problems with respiration noted  °Abdomen: Soft, gravid, appropriate for gestational age.  Pain/Pressure: Present     °Pelvic: Cervical exam deferred        °Extremities: Normal range of motion.  Edema: None  °Mental Status: Normal mood and affect. Normal behavior. Normal judgment and thought content.  ° °Assessment and Plan:  °Pregnancy: G2P1001 at [redacted]w[redacted]d °1. Previous cesarean delivery affecting pregnancy, antepartum °Plans TOLAC °- CBC °- Comp Met (CMET) °- Protein / creatinine ratio, urine ° °2. Supervision of other normal pregnancy, antepartum °Normal growth ° °3. Maternal obesity affecting pregnancy, antepartum °Body mass index is 50.06 kg/m². ° ° °4. History of gestational  hypertension °BP elevated will repeat on return visit and review today's labs ° °Term labor symptoms and general obstetric precautions including but not limited to vaginal bleeding, contractions, leaking of fluid and fetal movement were reviewed in detail with the patient. °Please refer to After Visit Summary for other counseling recommendations.  ° °Return in about 2 days (around 06/17/2021). ° °Future Appointments  °Date Time Provider Department Center  °06/16/2021  3:00 PM [facility] NURSE [facility]  °06/16/2021  3:15 PM [facility] NST [facility]  °06/25/2021  9:00 AM [facility] NURSE [facility]  °06/25/2021  9:15 AM [facility] US2 [facility]  °07/02/2021 10:30 AM [facility] NURSE [facility]  °07/02/2021 10:45 AM [facility] US6 [facility]  ° ° °James Arnold, MD ° °

## 2021-06-16 ENCOUNTER — Ambulatory Visit: Payer: Medicaid Other | Attending: Obstetrics | Admitting: *Deleted

## 2021-06-16 ENCOUNTER — Ambulatory Visit: Payer: Medicaid Other | Admitting: *Deleted

## 2021-06-16 ENCOUNTER — Encounter: Payer: Self-pay | Admitting: *Deleted

## 2021-06-16 VITALS — BP 127/71 | HR 102

## 2021-06-16 DIAGNOSIS — E669 Obesity, unspecified: Secondary | ICD-10-CM | POA: Diagnosis not present

## 2021-06-16 DIAGNOSIS — O99213 Obesity complicating pregnancy, third trimester: Secondary | ICD-10-CM | POA: Diagnosis not present

## 2021-06-16 DIAGNOSIS — O34219 Maternal care for unspecified type scar from previous cesarean delivery: Secondary | ICD-10-CM

## 2021-06-16 DIAGNOSIS — Z3A37 37 weeks gestation of pregnancy: Secondary | ICD-10-CM | POA: Diagnosis not present

## 2021-06-16 DIAGNOSIS — Z6841 Body Mass Index (BMI) 40.0 and over, adult: Secondary | ICD-10-CM

## 2021-06-16 DIAGNOSIS — Z3480 Encounter for supervision of other normal pregnancy, unspecified trimester: Secondary | ICD-10-CM

## 2021-06-16 LAB — CBC
Hematocrit: 33.4 % — ABNORMAL LOW (ref 34.0–46.6)
Hemoglobin: 11.3 g/dL (ref 11.1–15.9)
MCH: 30.4 pg (ref 26.6–33.0)
MCHC: 33.8 g/dL (ref 31.5–35.7)
MCV: 90 fL (ref 79–97)
Platelets: 207 10*3/uL (ref 150–450)
RBC: 3.72 x10E6/uL — ABNORMAL LOW (ref 3.77–5.28)
RDW: 13.1 % (ref 11.7–15.4)
WBC: 4.4 10*3/uL (ref 3.4–10.8)

## 2021-06-16 LAB — COMPREHENSIVE METABOLIC PANEL WITH GFR
ALT: 7 IU/L (ref 0–32)
AST: 13 IU/L (ref 0–40)
Albumin/Globulin Ratio: 1.3 (ref 1.2–2.2)
Albumin: 4 g/dL (ref 3.9–5.0)
Alkaline Phosphatase: 96 IU/L (ref 44–121)
BUN/Creatinine Ratio: 12 (ref 9–23)
BUN: 6 mg/dL (ref 6–20)
Bilirubin Total: 0.3 mg/dL (ref 0.0–1.2)
CO2: 22 mmol/L (ref 20–29)
Calcium: 9.1 mg/dL (ref 8.7–10.2)
Chloride: 104 mmol/L (ref 96–106)
Creatinine, Ser: 0.49 mg/dL — ABNORMAL LOW (ref 0.57–1.00)
Globulin, Total: 3.1 g/dL (ref 1.5–4.5)
Glucose: 73 mg/dL (ref 70–99)
Potassium: 4 mmol/L (ref 3.5–5.2)
Sodium: 141 mmol/L (ref 134–144)
Total Protein: 7.1 g/dL (ref 6.0–8.5)
eGFR: 130 mL/min/1.73

## 2021-06-16 LAB — PROTEIN / CREATININE RATIO, URINE
Creatinine, Urine: 164.5 mg/dL
Protein, Ur: 53.1 mg/dL
Protein/Creat Ratio: 323 mg/g{creat} — ABNORMAL HIGH (ref 0–200)

## 2021-06-16 NOTE — Procedures (Signed)
Carmen Price Sep 03, 1990 [redacted]w[redacted]d  Fetus A Non-Stress Test Interpretation for 06/16/21  Indication:  Obesity  Fetal Heart Rate A Mode: External Baseline Rate (A): 145 bpm Variability: Moderate Accelerations: 15 x 15 Decelerations: None Multiple birth?: No  Uterine Activity Mode: Palpation, Toco Contraction Frequency (min): none Resting Tone Palpated: Relaxed  Interpretation (Fetal Testing) Nonstress Test Interpretation: Reactive Overall Impression: Reassuring for gestational age Comments: Dr. Judeth Cornfield reviewed tracing

## 2021-06-17 ENCOUNTER — Encounter: Payer: Self-pay | Admitting: Obstetrics and Gynecology

## 2021-06-17 ENCOUNTER — Ambulatory Visit (INDEPENDENT_AMBULATORY_CARE_PROVIDER_SITE_OTHER): Payer: Medicaid Other | Admitting: Obstetrics and Gynecology

## 2021-06-17 ENCOUNTER — Other Ambulatory Visit: Payer: Self-pay

## 2021-06-17 VITALS — BP 127/80 | HR 100 | Wt 317.0 lb

## 2021-06-17 DIAGNOSIS — O99210 Obesity complicating pregnancy, unspecified trimester: Secondary | ICD-10-CM

## 2021-06-17 DIAGNOSIS — O34219 Maternal care for unspecified type scar from previous cesarean delivery: Secondary | ICD-10-CM

## 2021-06-17 DIAGNOSIS — Z3480 Encounter for supervision of other normal pregnancy, unspecified trimester: Secondary | ICD-10-CM

## 2021-06-17 NOTE — Progress Notes (Signed)
° °  PRENATAL VISIT NOTE  Subjective:  Carmen Price is a 30 y.o. G2P1001 at [redacted]w[redacted]d being seen today for ongoing prenatal care.  She is currently monitored for the following issues for this high-risk pregnancy and has Maternal obesity affecting pregnancy, antepartum; Supervision of other normal pregnancy, antepartum; Previous cesarean delivery affecting pregnancy, antepartum; BMI 45.0-49.9, adult (HCC); and History of gestational hypertension on their problem list.  Patient reports no complaints.  Contractions: Not present. Vag. Bleeding: None.  Movement: Present. Denies leaking of fluid.   The following portions of the patient's history were reviewed and updated as appropriate: allergies, current medications, past family history, past medical history, past social history, past surgical history and problem list.   Objective:   Vitals:   06/17/21 1021  BP: 127/80  Pulse: 100  Weight: (!) 317 lb (143.8 kg)    Fetal Status: Fetal Heart Rate (bpm): 138 Fundal Height: 39 cm Movement: Present     General:  Alert, oriented and cooperative. Patient is in no acute distress.  Skin: Skin is warm and dry. No rash noted.   Cardiovascular: Normal heart rate noted  Respiratory: Normal respiratory effort, no problems with respiration noted  Abdomen: Soft, gravid, appropriate for gestational age.  Pain/Pressure: Absent     Pelvic: Cervical exam deferred        Extremities: Normal range of motion.  Edema: None  Mental Status: Normal mood and affect. Normal behavior. Normal judgment and thought content.   Assessment and Plan:  Pregnancy: G2P1001 at [redacted]w[redacted]d 1. Supervision of other normal pregnancy, antepartum Patient is doing well without complaints Patient with history of GHTN returning today for BP check, normal PIH labs on 12/13 with some proteinuria Discussed plan for IOL if second occurrence of elevated BP  2. Previous cesarean delivery affecting pregnancy, antepartum Plans TOLAC  3. Maternal  obesity affecting pregnancy, antepartum Follow up ultrasound next week  Term labor symptoms and general obstetric precautions including but not limited to vaginal bleeding, contractions, leaking of fluid and fetal movement were reviewed in detail with the patient. Please refer to After Visit Summary for other counseling recommendations.   Return in about 1 week (around 06/24/2021) for in person, ROB, High risk.  Future Appointments  Date Time Provider Department Center  06/17/2021 10:35 AM Maverick Patman, Gigi Gin, MD [facility] None  06/25/2021  9:00 AM [facility] NURSE [facility]  06/25/2021  9:15 AM [facility] US2 [facility]  07/02/2021 10:30 AM [facility]SE [facility]  07/02/2021 10:45 AM [facility] US6 [facility]    Fannye Myer, MD

## 2021-06-17 NOTE — Progress Notes (Signed)
Pt presents for BP check.  PIH labs completed on 06/15/21.

## 2021-06-23 ENCOUNTER — Other Ambulatory Visit: Payer: Self-pay

## 2021-06-23 ENCOUNTER — Encounter: Payer: Self-pay | Admitting: Obstetrics and Gynecology

## 2021-06-23 ENCOUNTER — Ambulatory Visit (INDEPENDENT_AMBULATORY_CARE_PROVIDER_SITE_OTHER): Payer: Medicaid Other | Admitting: Obstetrics and Gynecology

## 2021-06-23 ENCOUNTER — Other Ambulatory Visit: Payer: Self-pay | Admitting: Advanced Practice Midwife

## 2021-06-23 VITALS — BP 135/86 | HR 105 | Wt 316.0 lb

## 2021-06-23 DIAGNOSIS — O34219 Maternal care for unspecified type scar from previous cesarean delivery: Secondary | ICD-10-CM

## 2021-06-23 DIAGNOSIS — Z3480 Encounter for supervision of other normal pregnancy, unspecified trimester: Secondary | ICD-10-CM

## 2021-06-23 DIAGNOSIS — Z8759 Personal history of other complications of pregnancy, childbirth and the puerperium: Secondary | ICD-10-CM

## 2021-06-23 NOTE — Progress Notes (Signed)
Patient presents for ROB. Patient has no concerns today. Patient denies having any headaches, visual changes, or right upper quadrant pain

## 2021-06-23 NOTE — Progress Notes (Signed)
Subjective:  Carmen Price is a 30 y.o. G2P1001 at [redacted]w[redacted]d being seen today for ongoing prenatal care.  She is currently monitored for the following issues for this high-risk pregnancy and has Maternal obesity affecting pregnancy, antepartum; Supervision of other normal pregnancy, antepartum; Previous cesarean delivery affecting pregnancy, antepartum; BMI 45.0-49.9, adult (HCC); and History of gestational hypertension on their problem list.  Patient reports general discomforts of pregnancy, denies HA or visual changes.  Contractions: Not present. Vag. Bleeding: None.  Movement: Present. Denies leaking of fluid.   The following portions of the patient's history were reviewed and updated as appropriate: allergies, current medications, past family history, past medical history, past social history, past surgical history and problem list. Problem list updated.  Objective:   Vitals:   06/23/21 0843 06/23/21 0847  BP: (!) 140/92 135/86  Pulse: (!) 102 (!) 105  Weight: (!) 316 lb (143.3 kg)     Fetal Status: Fetal Heart Rate (bpm): 140   Movement: Present     General:  Alert, oriented and cooperative. Patient is in no acute distress.  Skin: Skin is warm and dry. No rash noted.   Cardiovascular: Normal heart rate noted  Respiratory: Normal respiratory effort, no problems with respiration noted  Abdomen: Soft, gravid, appropriate for gestational age. Pain/Pressure: Absent     Pelvic:  Cervical exam performed        Extremities: Normal range of motion.  Edema: None  Mental Status: Normal mood and affect. Normal behavior. Normal judgment and thought content.   Urinalysis:      Assessment and Plan:  Pregnancy: G2P1001 at [redacted]w[redacted]d  1. Supervision of other normal pregnancy, antepartum Stable  2. Previous cesarean delivery affecting pregnancy, antepartum For TOLAC  3. History of gestational hypertension Now with Dx of GHTN IOL scheduled for tonight  Term labor symptoms and general obstetric  precautions including but not limited to vaginal bleeding, contractions, leaking of fluid and fetal movement were reviewed in detail with the patient. Please refer to After Visit Summary for other counseling recommendations.  No follow-ups on file.   Hermina Staggers, MD

## 2021-06-23 NOTE — Patient Instructions (Signed)
Vaginal Delivery ?Vaginal delivery means that you give birth by pushing your baby out of your birth canal (vagina). Your health care team will help you before, during, and after vaginal delivery. ?Birth experiences are unique for every woman and every pregnancy, and birth experiences vary depending on where you choose to give birth. ?What are the risks and benefits? ?Generally, this is safe. However, problems may occur, including: ?Bleeding. ?Infection. ?Damage to other structures such as vaginal tearing. ?Allergic reactions to medicines. ?Despite the risks, benefits of vaginal delivery include less risk of bleeding and infection and a shorter recovery time compared to a Cesarean delivery. Cesarean delivery, or C-section, is the surgical delivery of a baby. ?What happens when I arrive at the birth center or hospital? ?Once you are in labor and have been admitted into the hospital or birth center, your health care team may: ?Review your pregnancy history and any concerns that you have. ?Talk with you about your birth plan and discuss pain control options. ?Check your blood pressure, breathing, and heartbeat. ?Assess your baby's heartbeat. ?Monitor your uterus for contractions. ?Check whether your bag of water (amniotic sac) has broken (ruptured). ?Insert an IV into one of your veins. This may be used to give you fluids and medicines. ?Monitoring ?Your health care team may assess your contractions (uterine monitoring) and your baby's heart rate (fetal monitoring). You may need to be monitored: ?Often, but not continuously (intermittently). ?All the time or for long periods at a time (continuously). Continuous monitoring may be needed if: ?You are taking certain medicines, such as medicine to relieve pain or make your contractions stronger. ?You have pregnancy or labor complications. ?Monitoring may be done by: ?Placing a special stethoscope or a handheld monitoring device on your abdomen to check your baby's heartbeat  and to check for contractions. ?Placing monitors on your abdomen (external monitors) to record your baby's heartbeat and the frequency and length of contractions. ?Placing monitors inside your uterus through your vagina (internal monitors) to record your baby's heartbeat and the frequency, length, and strength of your contractions. Depending on the type of monitor, it may remain in your uterus or on your baby's head until birth. ?Telemetry. This is a type of continuous monitoring that can be done with external or internal monitors. Instead of having to stay in bed, you are able to move around. ?Physical exam ?Your health care team may perform frequent physical exams. This may include: ?Checking how and where your baby is positioned in your uterus. ?Checking your cervix to determine: ?Whether it is thinning out (effacing). ?Whether it is opening up (dilating). ?What happens during labor and delivery? ?Normal labor and delivery is divided into the following three stages: ?Stage 1 ?This is the longest stage of labor. ?Throughout this stage, you will feel contractions. Contractions generally feel mild, infrequent, and irregular at first. They get stronger, more frequent, and more regular as you move through this stage. You may have contractions about every 2-3 minutes. ?This stage ends when your cervix is completely dilated to 4 inches (10 cm) and completely effaced. ?Stage 2 ?This stage starts once your cervix is completely effaced and dilated and lasts until the delivery of your baby. ?This is the stage where you will feel an urge to push your baby out of your vagina. ?You may feel stretching and burning pain, especially when the widest part of your baby's head passes through the vaginal opening (crowning). ?Once your baby is delivered, the umbilical cord will be clamped and   cut. Timing of cutting the cord will depend on your wishes, your baby's health, and your health care provider's practices. ?Your baby will be  placed on your bare chest (skin-to-skin contact) in an upright position and covered with a warm blanket. If you are choosing to breastfeed, watch your baby for feeding cues, like rooting or sucking, and help the baby to your breast for his or her first feeding. ?Stage 3 ?This stage starts immediately after the birth of your baby and ends after you deliver the placenta. ?This stage may take anywhere from 5 to 30 minutes. ?After your baby has been delivered, you will feel contractions as your body expels the placenta. These contractions also help your uterus get smaller and reduce bleeding. ?What can I expect after labor and delivery? ?After labor is over, you and your baby will be assessed closely until you are ready to go home. Your health care team will teach you how to care for yourself and your baby. ?You and your baby may be encouraged to stay in the same room (rooming in) during your hospital stay. This will help promote early bonding and successful breastfeeding. ?Your uterus will be checked and massaged regularly (fundal massage). ?You may continue to receive fluids and medicines through an IV. ?You will have some soreness and pain in your abdomen, vagina, and the area of skin between your vaginal opening and your anus (perineum). ?If an incision was made near your vagina (episiotomy) or if you had some vaginal tearing during delivery, cold compresses may be placed on your episiotomy or your tear. This helps to reduce pain and swelling. ?It is normal to have vaginal bleeding after delivery. Wear a sanitary pad for vaginal bleeding and discharge. ?Summary ?Vaginal delivery means that you will give birth by pushing your baby out of your birth canal (vagina). ?Your health care team will monitor you and your baby throughout the stages of labor. ?After you deliver your baby, your health care team will continue to assess you and your baby to ensure you are both recovering as expected after delivery. ?This  information is not intended to replace advice given to you by your health care provider. Make sure you discuss any questions you have with your health care provider. ?Document Revised: 05/18/2020 Document Reviewed: 05/18/2020 ?Elsevier Patient Education ? 2022 Elsevier Inc[employer] ? ?

## 2021-06-24 ENCOUNTER — Encounter (HOSPITAL_COMMUNITY): Payer: Self-pay | Admitting: Obstetrics and Gynecology

## 2021-06-24 ENCOUNTER — Inpatient Hospital Stay (HOSPITAL_COMMUNITY): Payer: Medicaid Other

## 2021-06-24 ENCOUNTER — Inpatient Hospital Stay (HOSPITAL_COMMUNITY)
Admission: AD | Admit: 2021-06-24 | Discharge: 2021-06-27 | DRG: 786 | Disposition: A | Discharge status: Home / Self Care | Payer: Medicaid Other | Attending: Obstetrics and Gynecology | Admitting: Obstetrics and Gynecology

## 2021-06-24 DIAGNOSIS — O99892 Other specified diseases and conditions complicating childbirth: Secondary | ICD-10-CM | POA: Diagnosis not present

## 2021-06-24 DIAGNOSIS — Z87891 Personal history of nicotine dependence: Secondary | ICD-10-CM | POA: Diagnosis not present

## 2021-06-24 DIAGNOSIS — O9081 Anemia of the puerperium: Secondary | ICD-10-CM | POA: Diagnosis not present

## 2021-06-24 DIAGNOSIS — Z3480 Encounter for supervision of other normal pregnancy, unspecified trimester: Secondary | ICD-10-CM

## 2021-06-24 DIAGNOSIS — U071 COVID-19: Secondary | ICD-10-CM | POA: Diagnosis present

## 2021-06-24 DIAGNOSIS — O9852 Other viral diseases complicating childbirth: Secondary | ICD-10-CM | POA: Diagnosis present

## 2021-06-24 DIAGNOSIS — O134 Gestational [pregnancy-induced] hypertension without significant proteinuria, complicating childbirth: Secondary | ICD-10-CM | POA: Diagnosis present

## 2021-06-24 DIAGNOSIS — O99214 Obesity complicating childbirth: Secondary | ICD-10-CM | POA: Diagnosis present

## 2021-06-24 DIAGNOSIS — Z8759 Personal history of other complications of pregnancy, childbirth and the puerperium: Secondary | ICD-10-CM

## 2021-06-24 DIAGNOSIS — D62 Acute posthemorrhagic anemia: Secondary | ICD-10-CM | POA: Diagnosis not present

## 2021-06-24 DIAGNOSIS — Z3A38 38 weeks gestation of pregnancy: Secondary | ICD-10-CM

## 2021-06-24 DIAGNOSIS — O34211 Maternal care for low transverse scar from previous cesarean delivery: Secondary | ICD-10-CM | POA: Diagnosis present

## 2021-06-24 DIAGNOSIS — O99210 Obesity complicating pregnancy, unspecified trimester: Secondary | ICD-10-CM | POA: Diagnosis present

## 2021-06-24 DIAGNOSIS — Z30017 Encounter for initial prescription of implantable subdermal contraceptive: Secondary | ICD-10-CM

## 2021-06-24 DIAGNOSIS — O34219 Maternal care for unspecified type scar from previous cesarean delivery: Secondary | ICD-10-CM | POA: Diagnosis present

## 2021-06-24 DIAGNOSIS — O139 Gestational [pregnancy-induced] hypertension without significant proteinuria, unspecified trimester: Secondary | ICD-10-CM

## 2021-06-24 HISTORY — DX: Gestational (pregnancy-induced) hypertension without significant proteinuria, unspecified trimester: O13.9

## 2021-06-24 LAB — CBC
HCT: 31.8 % — ABNORMAL LOW (ref 36.0–46.0)
Hemoglobin: 10.4 g/dL — ABNORMAL LOW (ref 12.0–15.0)
MCH: 30.2 pg (ref 26.0–34.0)
MCHC: 32.7 g/dL (ref 30.0–36.0)
MCV: 92.4 fL (ref 80.0–100.0)
Platelets: 229 10*3/uL (ref 150–400)
RBC: 3.44 MIL/uL — ABNORMAL LOW (ref 3.87–5.11)
RDW: 13.3 % (ref 11.5–15.5)
WBC: 5.5 10*3/uL (ref 4.0–10.5)
nRBC: 0 % (ref 0.0–0.2)

## 2021-06-24 LAB — COMPREHENSIVE METABOLIC PANEL
ALT: 9 U/L (ref 0–44)
AST: 10 U/L — ABNORMAL LOW (ref 15–41)
Albumin: 2.8 g/dL — ABNORMAL LOW (ref 3.5–5.0)
Alkaline Phosphatase: 77 U/L (ref 38–126)
Anion gap: 7 (ref 5–15)
BUN: <5 mg/dL — ABNORMAL LOW (ref 6–20)
CO2: 22 mmol/L (ref 22–32)
Calcium: 8.3 mg/dL — ABNORMAL LOW (ref 8.9–10.3)
Chloride: 104 mmol/L (ref 98–111)
Creatinine, Ser: 0.48 mg/dL (ref 0.44–1.00)
GFR, Estimated: >60 mL/min (ref >60)
Glucose, Bld: 71 mg/dL (ref 70–99)
Potassium: 3 mmol/L — ABNORMAL LOW (ref 3.5–5.1)
Sodium: 133 mmol/L — ABNORMAL LOW (ref 135–145)
Total Bilirubin: 0.4 mg/dL (ref 0.3–1.2)
Total Protein: 6.4 g/dL — ABNORMAL LOW (ref 6.5–8.1)

## 2021-06-24 LAB — TYPE AND SCREEN
ABO/RH(D): O POS
Antibody Screen: NEGATIVE

## 2021-06-24 LAB — RESP PANEL BY RT-PCR (FLU A&B, COVID) ARPGX2
Influenza A by PCR: NEGATIVE
Influenza B by PCR: NEGATIVE
SARS Coronavirus 2 by RT PCR: POSITIVE — AB

## 2021-06-24 LAB — PROTEIN / CREATININE RATIO, URINE
Creatinine, Urine: 142.22 mg/dL
Protein Creatinine Ratio: 0.15 mg/mg{Cre} (ref 0.00–0.15)
Total Protein, Urine: 21 mg/dL

## 2021-06-24 LAB — RPR: RPR Ser Ql: NONREACTIVE

## 2021-06-24 MED ORDER — SOD CITRATE-CITRIC ACID 500-334 MG/5ML PO SOLN
30.0000 mL | ORAL | Status: DC | PRN
  Filled 2021-06-24: qty 30

## 2021-06-24 MED ORDER — OXYTOCIN BOLUS FROM INFUSION: 333.0000 mL | Freq: Once | INTRAVENOUS | Status: DC

## 2021-06-24 MED ORDER — FENTANYL CITRATE (PF) 100 MCG/2ML IJ SOLN
50.0000 ug | INTRAMUSCULAR | Status: DC | PRN
  Administered 2021-06-24: 23:00:00 100 ug via INTRAVENOUS
  Filled 2021-06-24: qty 2

## 2021-06-24 MED ORDER — OXYTOCIN-SODIUM CHLORIDE 30-0.9 UT/500ML-% IV SOLN
1.0000 m[IU]/min | INTRAVENOUS | Status: DC
  Administered 2021-06-24: 04:00:00 1 m[IU]/min via INTRAVENOUS
  Filled 2021-06-24: qty 500

## 2021-06-24 MED ORDER — LACTATED RINGERS IV SOLN: 500.0000 mL | INTRAVENOUS | Status: DC | PRN

## 2021-06-24 MED ORDER — LACTATED RINGERS IV SOLN
INTRAVENOUS | Status: DC
  Administered 2021-06-25: 10:00:00 125 mL/h via INTRAVENOUS

## 2021-06-24 MED ORDER — ACETAMINOPHEN 325 MG PO TABS: 650.0000 mg | ORAL_TABLET | ORAL | Status: DC | PRN

## 2021-06-24 MED ORDER — POTASSIUM CHLORIDE CRYS ER 10 MEQ PO TBCR
30.0000 meq | EXTENDED_RELEASE_TABLET | Freq: Once | ORAL | Status: AC
  Administered 2021-06-24: 07:00:00 30 meq via ORAL
  Filled 2021-06-24: qty 1

## 2021-06-24 MED ORDER — ONDANSETRON HCL 4 MG/2ML IJ SOLN
4.0000 mg | Freq: Four times a day (QID) | INTRAMUSCULAR | Status: DC | PRN
  Administered 2021-06-24: 22:00:00 4 mg via INTRAVENOUS
  Filled 2021-06-24: qty 2

## 2021-06-24 MED ORDER — OXYCODONE-ACETAMINOPHEN 5-325 MG PO TABS: 1.0000 | ORAL_TABLET | ORAL | Status: DC | PRN

## 2021-06-24 MED ORDER — LIDOCAINE HCL (PF) 1 % IJ SOLN: 30.0000 mL | INTRAMUSCULAR | Status: DC | PRN

## 2021-06-24 MED ORDER — OXYCODONE-ACETAMINOPHEN 5-325 MG PO TABS: 2.0000 | ORAL_TABLET | ORAL | Status: DC | PRN

## 2021-06-24 MED ORDER — OXYTOCIN-SODIUM CHLORIDE 30-0.9 UT/500ML-% IV SOLN
2.5000 [IU]/h | INTRAVENOUS | Status: DC
  Filled 2021-06-24: qty 500

## 2021-06-24 MED ORDER — TERBUTALINE SULFATE 1 MG/ML IJ SOLN: 0.2500 mg | Freq: Once | INTRAMUSCULAR | Status: DC | PRN

## 2021-06-24 NOTE — Plan of Care (Signed)

## 2021-06-24 NOTE — Progress Notes (Signed)
Patient ID: Carmen Price, female   DOB: 03-04-1991, 30 y.o.   MRN: [id number]  Labor Progress Note [name] is a 30 y.o. G2P1001 at [redacted]w[redacted]d presented for induced TOLAC for gHTN  S:  Called by RN to come place FB, pt ready and Dr. Annia Friendly had been called back to the OR. Pt resting in bed, remembered placement process from last labor. Tolerated procedure well with breathing and FOB support.  O:  BP (!) 155/95    Pulse 93    Temp 98.1 F (36.7 C) (Oral)    Resp 18    Ht 5\' 7"  (1.702 m)    Wt (!) 317 lb 8 oz (144 kg)    LMP 09/20/2020    BMI 49.73 kg/m  EFM: baseline 120 bpm/ moderate variability/ 15x15 accels/ no decels  Toco/IUPC: q2-29min SVE: Dilation: Fingertip Effacement (%): Thick Station: -3 Presentation: Vertex Exam by:: [email or web address] CNM Pitocin: 8 mu/min  A/P: 30 y.o. G2P1001 [redacted]w[redacted]d  1. Labor: Latent 2. FWB: Cat 1 3. Pain: Well-controlled with deep breathing and familial support 4. gHTN: BP elevated but not severe, labs normal although hypokalemic (will make MD aware), no symptoms of PEC.  FB placed easily, will hold Pitocin at 78mu/min and encouraged pt to frequently change positions and ambulate. Cautiously anticipate SVD.  4m, CNM 7:12 PM

## 2021-06-24 NOTE — H&P (Addendum)
OBSTETRIC ADMISSION HISTORY AND PHYSICAL Carmen Price is a 30 y.o. female G2P1001 with IUP at [redacted]w[redacted]d by [place] presenting for TOLAC/IOL for gHTN. She reports +FMs, No LOF, no VB, no blurry vision, headaches or peripheral edema, and RUQ pain.  She plans on bottle feeding. She request Nexplanon for birth control. She received her prenatal care at  [facility]    Dating: By Ultrasound --->  Estimated Date of Delivery: 07/06/21  Sono:   @[redacted]w[redacted]d , CWD, normal anatomy, cephalic presentation, posterior fundal placental lie, 2587g, 21% EFW   Prenatal History/Complications:  gHTN Hx of prior Csx1 (prior CS for arrest of dilation during IOL for preE with SF  Past Medical History: Past Medical History:  Diagnosis Date   Anemia    Anxiety    Blood transfusion during current hospitalization 02/03/2015   Blood transfusion without reported diagnosis    Depression    GERD (gastroesophageal reflux disease)    Headache    Pregnancy induced hypertension    Symptomatic anemia 02/03/2015   Vaginal Pap smear, abnormal     Past Surgical History: Past Surgical History:  Procedure Laterality Date   CESAREAN SECTION N/A 06/05/2020   Procedure: CESAREAN SECTION;  Surgeon: 14/09/2019, MD;  Location: [facility] LD [facility]S;  Service: Obstetrics;  Laterality: N/A;   TONSILLECTOMY      Obstetrical History: OB History     Gravida  2   Para  1   Term  1   Preterm  0   AB  0   Living  1      SAB  0   IAB  0   Ectopic  0   Multiple  0   Live Births  1           Social History Social History   Socioeconomic History   Marital status: Single    Spouse name: Not on file   Number of children: Not on file   Years of education: Not on file   Highest education level: Not on file  Occupational History   Not on file  Tobacco Use   Smoking status: Former    Packs/day: 0.25    Years: 4.00    Pack years: 1.00    Types: Cigarettes   Smokeless tobacco: Never  Vaping Use   Vaping Use: Never used   Substance and Sexual Activity   Alcohol use: Not Currently    Alcohol/week: 0.0 standard drinks    Comment: not since confirmed pregnancy   Drug use: No   Sexual activity: Yes    Partners: Male    Birth control/protection: None  Other Topics Concern   Not on file  Social History Narrative   Not on file   Social Determinants of Health   Financial Resource Strain: Not on file  Food Insecurity: Not on file  Transportation Needs: Not on file  Physical Activity: Not on file  Stress: Not on file  Social Connections: Not on file    Family History: Family History  Problem Relation Age of Onset   Hypertension Mother    Arthritis Mother    Depression Mother    Diabetes Father    Diabetes Maternal Grandmother    Cancer Maternal Grandmother    Hypertension Maternal Grandmother    Kidney disease Maternal Grandmother    Asthma Brother    ADD / ADHD Maternal Uncle    Diabetes Maternal Uncle    Hypertension Maternal Uncle     Allergies: No Known Allergies  Facility-Administered Medications Prior to Admission  Medication Dose Route Frequency Provider Last Rate Last Admin   aspirin chewable tablet 81 mg  81 mg Oral QAC breakfast Brock Bad, MD       Medications Prior to Admission  Medication Sig Dispense Refill Last Dose   aspirin EC 81 MG tablet Take 1 tablet (81 mg total) by mouth daily. Take after 12 weeks for prevention of preeclampsia later in pregnancy 300 tablet 2    Prenat-Fe Poly-Methfol-FA-DHA (VITAFOL ULTRA) 29-0.6-0.4-200 MG CAPS Take 1 capsule by mouth daily before breakfast. 90 capsule 4      Review of Systems   All systems reviewed and negative except as stated in HPI  Blood pressure 128/66, pulse 81, temperature 98 F (36.7 C), temperature source Oral, height 5\' 7"  (1.702 m), weight (!) 144 kg, last menstrual period 09/20/2020, not currently breastfeeding. General appearance: alert Lungs: clear to auscultation bilaterally Heart: regular rate and  rhythm Abdomen: soft, non-tender; bowel sounds normal Extremities: Homans sign is negative, no sign of DVT Presentation: cephalic Fetal monitoringBaseline: 120 bpm, Variability: Good {> 6 bpm), Accelerations: Reactive, and Decelerations: Absent Uterine activity irregular Dilation: Closed Effacement (%): 30 Station: -3 Exam by:: Dr. [email or web address]   Prenatal labs: ABO, Rh: --/--/O POS (12/22 0212) Antibody: NEG (12/22 11-24-1998) Rubella: 4.37 (06/13 1634) RPR: Non Reactive (10/04 0938)  HBsAg: Negative (06/13 1634)  HIV: Non Reactive (10/04 0938)  GBS: Negative/-- (12/06 1048)  2 hr Glucola normal Genetic screening  LR NIPS Anatomy 01-[birth year]-1986 normal  Prenatal Transfer Tool  Maternal Diabetes: No Genetic Screening: Normal Maternal Ultrasounds/Referrals: Normal Fetal Ultrasounds or other Referrals:  None Maternal Substance Abuse:  No Significant Maternal Medications:  None Significant Maternal Lab Results: Group B Strep negative  Results for orders placed or performed during the hospital encounter of 06/24/21 (from the past 24 hour(s))  CBC   Collection Time: 06/24/21  2:11 AM  Result Value Ref Range   WBC 5.5 4.0 - 10.5 K/uL   RBC 3.44 (L) 3.87 - 5.11 MIL/uL   Hemoglobin 10.4 (L) 12.0 - 15.0 g/dL   HCT 06/26/21 (L) 84.1 - 66.0 %   MCV 92.4 80.0 - 100.0 fL   MCH 30.2 26.0 - 34.0 pg   MCHC 32.7 30.0 - 36.0 g/dL   RDW 63.0 16.0 - 10.9 %   Platelets 229 150 - 400 K/uL   nRBC 0.0 0.0 - 0.2 %  Comprehensive metabolic panel   Collection Time: 06/24/21  2:11 AM  Result Value Ref Range   Sodium 133 (L) 135 - 145 mmol/L   Potassium 3.0 (L) 3.5 - 5.1 mmol/L   Chloride 104 98 - 111 mmol/L   CO2 22 22 - 32 mmol/L   Glucose, Bld 71 70 - 99 mg/dL   BUN <5 (L) 6 - 20 mg/dL   Creatinine, Ser 06/26/21 0.44 - 1.00 mg/dL   Calcium 8.3 (L) 8.9 - 10.3 mg/dL   Total Protein 6.4 (L) 6.5 - 8.1 g/dL   Albumin 2.8 (L) 3.5 - 5.0 g/dL   AST 10 (L) 15 - 41 U/L   ALT 9 0 - 44 U/L   Alkaline Phosphatase 77 38 - 126 U/L    Total Bilirubin 0.4 0.3 - 1.2 mg/dL   GFR, Estimated 5.57 >32 mL/min   Anion gap 7 5 - 15  Type and screen   Collection Time: 06/24/21  2:12 AM  Result Value Ref Range   ABO/RH(D) O POS    Antibody Screen  NEG    Sample Expiration      06/27/2021,2359 Performed at [facility], [address] [place], [place] [address]     Patient Active Problem List   Diagnosis Date Noted   Gestational hypertension 06/24/2021   History of gestational hypertension 06/15/2021   BMI 45.0-49.9, adult (HCC) 04/20/2021   Previous cesarean delivery affecting pregnancy, antepartum 01/11/2021   Supervision of other normal pregnancy, antepartum 12/07/2020   Maternal obesity affecting pregnancy, antepartum 11/28/2019    Assessment/Plan:  Carmen Price is a 30 y.o. G2P1001 at [redacted]w[redacted]d here for IOL for gHTN  #Labor: Here for TOLAC/IOL for gHTN. Last CS for arrest of dilation at 4.5 cm in setting of IOL for severe PreE. Cervix at admission closed/thick/high. Will start with low dose pitocin 1x1 and cap at 8. Recheck in ~2-3 hours and place foley balloon. #Pain: IV pain meds. #FWB: Cat I #ID:  GBS neg #MOF: bottle #MOC: Nexplanon #Circ:  N/A  #gHTN BP at admission normotensive. Hx of preE with last pregnancy. preE labs sent at admission  Warner Mccreedy, MD, MPH OB Fellow, Faculty Practice

## 2021-06-24 NOTE — Progress Notes (Signed)
Carmen Price is a 30 y.o. G2P1001 at [redacted]w[redacted]d by admitted for TOLAC/induction of labor due to gHTN.  Subjective: Reports not feeling much cramping.  Objective: BP 114/60    Pulse 81    Temp 98 F (36.7 C) (Oral)    Resp 18    Ht 5\' 7"  (1.702 m)    Wt (!) 144 kg    LMP 09/20/2020    BMI 49.73 kg/m  No intake/output data recorded. No intake/output data recorded.  FHT:  FHR: 120 bpm, variability: moderate,  accelerations:  Present,  decelerations:  Absent UC:   irregular SVE:   Dilation: Fingertip Effacement (%): 40 Station: -3 Exam by:: [email or web address] MD  Labs: Lab Results  Component Value Date   WBC 5.5 06/24/2021   HGB 10.4 (L) 06/24/2021   HCT 31.8 (L) 06/24/2021   MCV 92.4 06/24/2021   PLT 229 06/24/2021    Assessment / Plan: G2P1001 at [redacted]w[redacted]d by admitted for TOLAC/induction of labor due to gHTN.  Labor: cervix 0.5cm internally. Externally 1 cm. On low dose pitocin. 1X1. Will go up until 8/hr while cervical ripening. Will assess for balloon placement at next check. gHTN:  labs stable Fetal Wellbeing:  Category I I/D:   GBS neg   [redacted]w[redacted]d 06/24/2021, 7:44 AM

## 2021-06-25 ENCOUNTER — Ambulatory Visit: Payer: Medicaid Other

## 2021-06-25 ENCOUNTER — Inpatient Hospital Stay (HOSPITAL_COMMUNITY): Payer: Medicaid Other | Admitting: Anesthesiology

## 2021-06-25 ENCOUNTER — Encounter (HOSPITAL_COMMUNITY): Payer: Self-pay | Admitting: Obstetrics and Gynecology

## 2021-06-25 ENCOUNTER — Encounter (HOSPITAL_COMMUNITY)
Admission: AD | Disposition: A | Discharge status: Home / Self Care | Payer: Self-pay | Source: Home / Self Care | Attending: Obstetrics and Gynecology

## 2021-06-25 ENCOUNTER — Other Ambulatory Visit: Payer: Self-pay

## 2021-06-25 DIAGNOSIS — Z3A38 38 weeks gestation of pregnancy: Secondary | ICD-10-CM

## 2021-06-25 DIAGNOSIS — O34211 Maternal care for low transverse scar from previous cesarean delivery: Secondary | ICD-10-CM

## 2021-06-25 DIAGNOSIS — O134 Gestational [pregnancy-induced] hypertension without significant proteinuria, complicating childbirth: Secondary | ICD-10-CM

## 2021-06-25 DIAGNOSIS — O99892 Other specified diseases and conditions complicating childbirth: Secondary | ICD-10-CM

## 2021-06-25 LAB — CBC WITH DIFFERENTIAL/PLATELET
Abs Immature Granulocytes: 0.02 10*3/uL (ref 0.00–0.07)
Basophils Absolute: 0 10*3/uL (ref 0.0–0.1)
Basophils Relative: 0 %
Eosinophils Absolute: 0 10*3/uL (ref 0.0–0.5)
Eosinophils Relative: 0 %
HCT: 31.3 % — ABNORMAL LOW (ref 36.0–46.0)
Hemoglobin: 10.4 g/dL — ABNORMAL LOW (ref 12.0–15.0)
Immature Granulocytes: 0 %
Lymphocytes Relative: 20 %
Lymphs Abs: 1.2 10*3/uL (ref 0.7–4.0)
MCH: 30.6 pg (ref 26.0–34.0)
MCHC: 33.2 g/dL (ref 30.0–36.0)
MCV: 92.1 fL (ref 80.0–100.0)
Monocytes Absolute: 0.5 10*3/uL (ref 0.1–1.0)
Monocytes Relative: 8 %
Neutro Abs: 4.3 10*3/uL (ref 1.7–7.7)
Neutrophils Relative %: 72 %
Platelets: 206 10*3/uL (ref 150–400)
RBC: 3.4 MIL/uL — ABNORMAL LOW (ref 3.87–5.11)
RDW: 13.3 % (ref 11.5–15.5)
WBC: 6 10*3/uL (ref 4.0–10.5)
nRBC: 0 % (ref 0.0–0.2)

## 2021-06-25 LAB — COMPREHENSIVE METABOLIC PANEL
ALT: 9 U/L (ref 0–44)
AST: 11 U/L — ABNORMAL LOW (ref 15–41)
Albumin: 2.7 g/dL — ABNORMAL LOW (ref 3.5–5.0)
Alkaline Phosphatase: 79 U/L (ref 38–126)
Anion gap: 5 (ref 5–15)
BUN: <5 mg/dL — ABNORMAL LOW (ref 6–20)
CO2: 25 mmol/L (ref 22–32)
Calcium: 8.2 mg/dL — ABNORMAL LOW (ref 8.9–10.3)
Chloride: 106 mmol/L (ref 98–111)
Creatinine, Ser: 0.56 mg/dL (ref 0.44–1.00)
GFR, Estimated: >60 mL/min (ref >60)
Glucose, Bld: 75 mg/dL (ref 70–99)
Potassium: 3.2 mmol/L — ABNORMAL LOW (ref 3.5–5.1)
Sodium: 136 mmol/L (ref 135–145)
Total Bilirubin: 0.5 mg/dL (ref 0.3–1.2)
Total Protein: 6.2 g/dL — ABNORMAL LOW (ref 6.5–8.1)

## 2021-06-25 SURGERY — Surgical Case
Anesthesia: Spinal

## 2021-06-25 MED ORDER — WITCH HAZEL-GLYCERIN EX PADS: 1.0000 "application " | MEDICATED_PAD | CUTANEOUS | Status: DC | PRN

## 2021-06-25 MED ORDER — KETOROLAC TROMETHAMINE 15 MG/ML IJ SOLN
30.0000 mg | Freq: Once | INTRAMUSCULAR | Status: DC
  Filled 2021-06-25: qty 2

## 2021-06-25 MED ORDER — EPHEDRINE SULFATE 50 MG/ML IJ SOLN
INTRAMUSCULAR | Status: DC | PRN
  Administered 2021-06-25: 10 mg via INTRAVENOUS

## 2021-06-25 MED ORDER — TETANUS-DIPHTH-ACELL PERTUSSIS 5-2.5-18.5 LF-MCG/0.5 IM SUSY: 0.5000 mL | PREFILLED_SYRINGE | Freq: Once | INTRAMUSCULAR | Status: DC

## 2021-06-25 MED ORDER — SODIUM CHLORIDE 0.9 % IV SOLN
INTRAVENOUS | Status: DC | PRN
  Administered 2021-06-25: 15:00:00 500 mg via INTRAVENOUS

## 2021-06-25 MED ORDER — SCOPOLAMINE 1 MG/3DAYS TD PT72
1.0000 | MEDICATED_PATCH | TRANSDERMAL | Status: DC
  Administered 2021-06-25: 17:00:00 1.5 mg via TRANSDERMAL

## 2021-06-25 MED ORDER — OXYTOCIN-SODIUM CHLORIDE 30-0.9 UT/500ML-% IV SOLN
INTRAVENOUS | Status: AC
  Filled 2021-06-25: qty 500

## 2021-06-25 MED ORDER — OXYCODONE HCL 5 MG PO TABS: 5.0000 mg | ORAL_TABLET | Freq: Once | ORAL | Status: DC | PRN

## 2021-06-25 MED ORDER — KETOROLAC TROMETHAMINE 30 MG/ML IJ SOLN
INTRAMUSCULAR | Status: AC
  Filled 2021-06-25: qty 1

## 2021-06-25 MED ORDER — ENOXAPARIN SODIUM 80 MG/0.8ML IJ SOSY
80.0000 mg | PREFILLED_SYRINGE | INTRAMUSCULAR | Status: DC
  Administered 2021-06-26 – 2021-06-27 (×2): 80 mg via SUBCUTANEOUS
  Filled 2021-06-25 (×2): qty 0.8

## 2021-06-25 MED ORDER — MENTHOL 3 MG MT LOZG: 1.0000 | LOZENGE | OROMUCOSAL | Status: DC | PRN

## 2021-06-25 MED ORDER — SCOPOLAMINE 1 MG/3DAYS TD PT72
MEDICATED_PATCH | TRANSDERMAL | Status: AC
  Filled 2021-06-25: qty 1

## 2021-06-25 MED ORDER — KETOROLAC TROMETHAMINE 30 MG/ML IJ SOLN
30.0000 mg | Freq: Once | INTRAMUSCULAR | Status: AC
  Administered 2021-06-25: 17:00:00 30 mg via INTRAVENOUS

## 2021-06-25 MED ORDER — OXYTOCIN-SODIUM CHLORIDE 30-0.9 UT/500ML-% IV SOLN
1.0000 m[IU]/min | INTRAVENOUS | Status: DC
Start: 2021-06-25 — End: 2021-06-25

## 2021-06-25 MED ORDER — ZOLPIDEM TARTRATE 5 MG PO TABS: 5.0000 mg | ORAL_TABLET | Freq: Every evening | ORAL | Status: DC | PRN

## 2021-06-25 MED ORDER — FENTANYL CITRATE (PF) 100 MCG/2ML IJ SOLN
INTRAMUSCULAR | Status: AC
  Filled 2021-06-25: qty 2

## 2021-06-25 MED ORDER — ALBUTEROL SULFATE HFA 108 (90 BASE) MCG/ACT IN AERS
INHALATION_SPRAY | RESPIRATORY_TRACT | Status: DC | PRN
  Administered 2021-06-25: 2 via RESPIRATORY_TRACT
  Administered 2021-06-25: 1 via RESPIRATORY_TRACT

## 2021-06-25 MED ORDER — SODIUM CHLORIDE 0.9 % IR SOLN
Status: DC | PRN
  Administered 2021-06-25: 1

## 2021-06-25 MED ORDER — DEXAMETHASONE SODIUM PHOSPHATE 10 MG/ML IJ SOLN
INTRAMUSCULAR | Status: AC
  Filled 2021-06-25: qty 1

## 2021-06-25 MED ORDER — ALBUTEROL SULFATE (2.5 MG/3ML) 0.083% IN NEBU
2.5000 mg | INHALATION_SOLUTION | RESPIRATORY_TRACT | Status: DC
  Administered 2021-06-26: 2.5 mg via RESPIRATORY_TRACT
  Filled 2021-06-25: qty 3

## 2021-06-25 MED ORDER — OXYCODONE HCL 5 MG/5ML PO SOLN: 5.0000 mg | Freq: Once | ORAL | Status: DC | PRN

## 2021-06-25 MED ORDER — SENNOSIDES-DOCUSATE SODIUM 8.6-50 MG PO TABS
2.0000 | ORAL_TABLET | Freq: Every day | ORAL | Status: DC
  Administered 2021-06-26: 09:00:00 2 via ORAL
  Filled 2021-06-25 (×2): qty 2

## 2021-06-25 MED ORDER — MORPHINE SULFATE (PF) 0.5 MG/ML IJ SOLN
INTRAMUSCULAR | Status: DC | PRN
  Administered 2021-06-25: 150 ug via INTRATHECAL

## 2021-06-25 MED ORDER — IBUPROFEN 600 MG PO TABS
600.0000 mg | ORAL_TABLET | Freq: Four times a day (QID) | ORAL | Status: DC
  Administered 2021-06-26 – 2021-06-27 (×5): 600 mg via ORAL
  Filled 2021-06-25 (×5): qty 1

## 2021-06-25 MED ORDER — ONDANSETRON HCL 4 MG/2ML IJ SOLN
INTRAMUSCULAR | Status: AC
  Filled 2021-06-25: qty 2

## 2021-06-25 MED ORDER — PHENYLEPHRINE HCL-NACL 20-0.9 MG/250ML-% IV SOLN
INTRAVENOUS | Status: AC
  Filled 2021-06-25: qty 250

## 2021-06-25 MED ORDER — ACETAMINOPHEN 325 MG PO TABS: 325.0000 mg | ORAL_TABLET | ORAL | Status: DC | PRN

## 2021-06-25 MED ORDER — OXYCODONE HCL 5 MG PO TABS: 5.0000 mg | ORAL_TABLET | ORAL | Status: DC | PRN

## 2021-06-25 MED ORDER — PHENYLEPHRINE HCL (PRESSORS) 10 MG/ML IV SOLN
INTRAVENOUS | Status: DC | PRN
  Administered 2021-06-25: 80 ug via INTRAVENOUS
  Administered 2021-06-25 (×2): 40 ug via INTRAVENOUS

## 2021-06-25 MED ORDER — BUPIVACAINE IN DEXTROSE 0.75-8.25 % IT SOLN
INTRATHECAL | Status: DC | PRN
  Administered 2021-06-25: 1.65 mL via INTRATHECAL

## 2021-06-25 MED ORDER — SIMETHICONE 80 MG PO CHEW: 80.0000 mg | CHEWABLE_TABLET | ORAL | Status: DC | PRN

## 2021-06-25 MED ORDER — DIBUCAINE (PERIANAL) 1 % EX OINT: 1.0000 "application " | TOPICAL_OINTMENT | CUTANEOUS | Status: DC | PRN

## 2021-06-25 MED ORDER — FENTANYL CITRATE (PF) 100 MCG/2ML IJ SOLN: 25.0000 ug | INTRAMUSCULAR | Status: DC | PRN

## 2021-06-25 MED ORDER — ONDANSETRON HCL 4 MG/2ML IJ SOLN: 4.0000 mg | Freq: Once | INTRAMUSCULAR | Status: DC | PRN

## 2021-06-25 MED ORDER — COCONUT OIL OIL: 1.0000 "application " | TOPICAL_OIL | Status: DC | PRN

## 2021-06-25 MED ORDER — ACETAMINOPHEN 500 MG PO TABS
1000.0000 mg | ORAL_TABLET | Freq: Four times a day (QID) | ORAL | Status: DC
  Administered 2021-06-26 – 2021-06-27 (×6): 1000 mg via ORAL
  Filled 2021-06-25 (×6): qty 2

## 2021-06-25 MED ORDER — ACETAMINOPHEN 500 MG PO TABS
1000.0000 mg | ORAL_TABLET | Freq: Four times a day (QID) | ORAL | Status: DC
  Administered 2021-06-25: 20:00:00 1000 mg via ORAL
  Filled 2021-06-25: qty 2

## 2021-06-25 MED ORDER — ALBUTEROL SULFATE (2.5 MG/3ML) 0.083% IN NEBU: 2.5000 mg | INHALATION_SOLUTION | RESPIRATORY_TRACT | Status: DC | PRN

## 2021-06-25 MED ORDER — FENTANYL CITRATE (PF) 100 MCG/2ML IJ SOLN
INTRAMUSCULAR | Status: DC | PRN
  Administered 2021-06-25: 15 ug via INTRATHECAL

## 2021-06-25 MED ORDER — MEPERIDINE HCL 25 MG/ML IJ SOLN: 6.2500 mg | INTRAMUSCULAR | Status: DC | PRN

## 2021-06-25 MED ORDER — HYDROMORPHONE HCL 1 MG/ML IJ SOLN: 0.2000 mg | INTRAMUSCULAR | Status: DC | PRN

## 2021-06-25 MED ORDER — PHENYLEPHRINE 40 MCG/ML (10ML) SYRINGE FOR IV PUSH (FOR BLOOD PRESSURE SUPPORT)
PREFILLED_SYRINGE | INTRAVENOUS | Status: AC
  Filled 2021-06-25: qty 10

## 2021-06-25 MED ORDER — ONDANSETRON HCL 4 MG/2ML IJ SOLN
INTRAMUSCULAR | Status: DC | PRN
  Administered 2021-06-25: 4 mg via INTRAVENOUS

## 2021-06-25 MED ORDER — KETOROLAC TROMETHAMINE 30 MG/ML IJ SOLN
30.0000 mg | Freq: Four times a day (QID) | INTRAMUSCULAR | Status: AC
  Administered 2021-06-25: 20:00:00 30 mg via INTRAVENOUS
  Filled 2021-06-25 (×2): qty 1

## 2021-06-25 MED ORDER — PHENYLEPHRINE HCL-NACL 20-0.9 MG/250ML-% IV SOLN
INTRAVENOUS | Status: DC | PRN
  Administered 2021-06-25: 60 ug/min via INTRAVENOUS

## 2021-06-25 MED ORDER — OXYTOCIN-SODIUM CHLORIDE 30-0.9 UT/500ML-% IV SOLN
2.5000 [IU]/h | INTRAVENOUS | Status: AC
  Administered 2021-06-25: 19:00:00 2.5 [IU]/h via INTRAVENOUS
  Filled 2021-06-25: qty 500

## 2021-06-25 MED ORDER — EPHEDRINE 5 MG/ML INJ
INTRAVENOUS | Status: AC
  Filled 2021-06-25: qty 5

## 2021-06-25 MED ORDER — DEXTROSE 5 % IV SOLN
INTRAVENOUS | Status: DC | PRN
  Administered 2021-06-25: 15:00:00 3 g via INTRAVENOUS

## 2021-06-25 MED ORDER — DIPHENHYDRAMINE HCL 25 MG PO CAPS: 25.0000 mg | ORAL_CAPSULE | Freq: Four times a day (QID) | ORAL | Status: DC | PRN

## 2021-06-25 MED ORDER — OXYTOCIN-SODIUM CHLORIDE 30-0.9 UT/500ML-% IV SOLN
INTRAVENOUS | Status: DC | PRN
  Administered 2021-06-25: 30 mL via INTRAVENOUS

## 2021-06-25 MED ORDER — MORPHINE SULFATE (PF) 0.5 MG/ML IJ SOLN
INTRAMUSCULAR | Status: AC
  Filled 2021-06-25: qty 10

## 2021-06-25 MED ORDER — STERILE WATER FOR IRRIGATION IR SOLN
Status: DC | PRN
  Administered 2021-06-25: 1

## 2021-06-25 MED ORDER — ACETAMINOPHEN 160 MG/5ML PO SOLN: 325.0000 mg | ORAL | Status: DC | PRN

## 2021-06-25 MED ORDER — PRENATAL MULTIVITAMIN CH
1.0000 | ORAL_TABLET | Freq: Every day | ORAL | Status: DC
  Administered 2021-06-26 – 2021-06-27 (×2): 1 via ORAL
  Filled 2021-06-25 (×2): qty 1

## 2021-06-25 MED ORDER — SOD CITRATE-CITRIC ACID 500-334 MG/5ML PO SOLN: 30.0000 mL | Freq: Once | ORAL | Status: DC

## 2021-06-25 MED ORDER — SIMETHICONE 80 MG PO CHEW
80.0000 mg | CHEWABLE_TABLET | Freq: Three times a day (TID) | ORAL | Status: DC
  Administered 2021-06-25 – 2021-06-27 (×5): 80 mg via ORAL
  Filled 2021-06-25 (×5): qty 1

## 2021-06-25 SURGICAL SUPPLY — 33 items
BENZOIN TINCTURE PRP APPL 2/3 (GAUZE/BANDAGES/DRESSINGS) ×1 IMPLANT
CHLORAPREP W/TINT 26ML (MISCELLANEOUS) ×2 IMPLANT
CLAMP CORD UMBIL (MISCELLANEOUS) IMPLANT
CLOTH BEACON ORANGE TIMEOUT ST (SAFETY) ×2 IMPLANT
DRSG OPSITE POSTOP 4X10 (GAUZE/BANDAGES/DRESSINGS) ×2 IMPLANT
ELECT REM PT RETURN 9FT ADLT (ELECTROSURGICAL) ×2
ELECTRODE REM PT RTRN 9FT ADLT (ELECTROSURGICAL) ×1 IMPLANT
EXTRACTOR VACUUM BELL CUP MITY (SUCTIONS) ×1 IMPLANT
EXTRACTOR VACUUM KIWI (MISCELLANEOUS) ×1 IMPLANT
GLOVE SURG ENC MOIS LTX SZ6 (GLOVE) ×2 IMPLANT
GLOVE SURG UNDER POLY LF SZ7 (GLOVE) ×4 IMPLANT
GOWN STRL REUS W/TWL LRG LVL3 (GOWN DISPOSABLE) ×4 IMPLANT
HOVERMATT SINGLE USE (MISCELLANEOUS) ×1 IMPLANT
KIT ABG SYR 3ML LUER SLIP (SYRINGE) IMPLANT
NDL HYPO 25X5/8 SAFETYGLIDE (NEEDLE) IMPLANT
NEEDLE HYPO 22GX1.5 SAFETY (NEEDLE) IMPLANT
NEEDLE HYPO 25X5/8 SAFETYGLIDE (NEEDLE) IMPLANT
NS IRRIG 1000ML POUR BTL (IV SOLUTION) ×2 IMPLANT
PACK C SECTION WH (CUSTOM PROCEDURE TRAY) ×2 IMPLANT
PAD OB MATERNITY 4.3X12.25 (PERSONAL CARE ITEMS) ×2 IMPLANT
PENCIL SMOKE EVAC W/HOLSTER (ELECTROSURGICAL) ×2 IMPLANT
RETRACTOR WND ALEXIS 25 LRG (MISCELLANEOUS) IMPLANT
RTRCTR WOUND ALEXIS 25CM LRG (MISCELLANEOUS)
STRIP CLOSURE SKIN 1/2X4 (GAUZE/BANDAGES/DRESSINGS) ×1 IMPLANT
SUT PLAIN 2 0 XLH (SUTURE) ×1 IMPLANT
SUT VIC AB 0 CT1 36 (SUTURE) ×7 IMPLANT
SUT VIC AB 2-0 CT1 27 (SUTURE) ×4
SUT VIC AB 2-0 CT1 TAPERPNT 27 (SUTURE) ×1 IMPLANT
SUT VIC AB 4-0 KS 27 (SUTURE) ×2 IMPLANT
SYR CONTROL 10ML LL (SYRINGE) IMPLANT
TOWEL OR 17X24 6PK STRL BLUE (TOWEL DISPOSABLE) ×2 IMPLANT
TRAY FOLEY W/BAG SLVR 14FR LF (SET/KITS/TRAYS/PACK) IMPLANT
WATER STERILE IRR 1000ML POUR (IV SOLUTION) ×2 IMPLANT

## 2021-06-25 NOTE — Progress Notes (Signed)
Citrate given prior to transport to OR. Would not scan on MAR.

## 2021-06-25 NOTE — Discharge Summary (Signed)
Postpartum Discharge Summary  Date of Service updated    Patient Name: Carmen Price DOB: 02-Dec-1990 MRN: [id number]  Date of admission: 06/24/2021 Delivery date:06/25/2021  Delivering provider: Radene Gunning  Date of discharge: 06/27/2021  Admitting diagnosis: Gestational hypertension [O13.9] Intrauterine pregnancy: [redacted]w[redacted]d    Secondary diagnosis:  Principal Problem:   Cesarean delivery delivered Active Problems:   Maternal obesity affecting pregnancy, antepartum   Supervision of other normal pregnancy, antepartum   Previous cesarean delivery affecting pregnancy, antepartum   History of gestational hypertension   Gestational hypertension  Additional problems: Postoperative anemia, Desired birth control (Nexplanon)    Discharge diagnosis: Term Pregnancy Delivered                                              Post partum procedures: Nexplanon insertion Augmentation: AROM, Pitocin, and IP Foley Complications: None  Hospital course: Induction of Labor With Cesarean Section   30y.o. yo G2P1001 at 352w3das admitted to the hospital 06/24/2021 for induction of labor. Patient initially presented for TOLAC and received a IP foley, Pitocin, and AROM. Patient had minimal progression after reaching 5 cm dilation. Patient ultimately decided that she would rather proceed with repeat cesarean section instead of TOLAC. The patient went for cesarean section due to Elective Repeat. Delivery details are as follows: Membrane Rupture Time/Date: 6:33 AM ,06/25/2021   Delivery Method:C-Section, Low Transverse  Details of operation can be found in separate operative Note.  Patient had an uncomplicated postpartum course. She is ambulating, tolerating a regular diet, passing flatus, and urinating well. She had postoperative anemia from blood loss from surgery and was asymptomatic - she was sent home on Iron. She desired Nexplanon for birth control and this was inserted on 12/25. Patient is discharged  home in stable condition on 06/27/21.      Newborn Data: Birth date:06/25/2021  Birth time:2:53 PM  Gender:Female  Living status:Living  Apgars:5 ,8  Weight:2600 g                                Magnesium Sulfate received: No BMZ received: No Rhophylac: N/A MMR: N/A T-DaP: Given prenatally Flu: No Transfusion: No   Physical exam  Vitals:   06/26/21 0801 06/26/21 1650 06/26/21 2121 06/27/21 0350  BP: 102/62 120/71 131/60 116/73  Pulse:  93 88 70  Resp:  _0 Temp:  97.9 F (36.6 C) 97.8 F (36.6 C) 98 F (36.7 C)  TempSrc:  Oral Oral Oral  SpO2:   100% 100%  Weight:      Height:       General: alert, cooperative, and no distress Lochia: appropriate Uterine Fundus: firm Incision: Healing well with no significant drainage, No significant erythema DVT Evaluation: No evidence of DVT seen on physical exam. No cords or calf tenderness.  Labs: Lab Results  Component Value Date   WBC 11.4 (H) 06/26/2021   HGB 9.0 (L) 06/26/2021   HCT 27.4 (L) 06/26/2021   MCV 92.3 06/26/2021   PLT 210 06/26/2021   CMP Latest Ref Rng & Units 06/25/2021  Glucose 70 - 99 mg/dL 75  BUN 6 - 20 mg/dL <5(L)  Creatinine 0.44 - 1.00 mg/dL 0.56  Sodium 135 - 145 mmol/L 136  Potassium 3.5 - 5.1 mmol/L 3.2(L)  Chloride 98 - 111 mmol/L 106  CO2 22 - 32 mmol/L 25  Calcium 8.9 - 10.3 mg/dL 8.2(L)  Total Protein 6.5 - 8.1 g/dL 6.2(L)  Total Bilirubin 0.3 - 1.2 mg/dL 0.5  Alkaline Phos 38 - 126 U/L 79  AST 15 - 41 U/L 11(L)  ALT 0 - 44 U/L 9   Edinburgh Score: Edinburgh Postnatal Depression Scale Screening Tool 06/26/2021  I have been able to laugh and see the funny side of things. 0  I have looked forward with enjoyment to things. 0  I have blamed myself unnecessarily when things went wrong. 0  I have been anxious or worried for no good reason. 0  I have felt scared or panicky for no good reason. 0  Things have been getting on top of me. 0  I have been so unhappy that I have had  difficulty sleeping. 0  I have felt sad or miserable. 0  I have been so unhappy that I have been crying. 0  The thought of harming myself has occurred to me. 0  Edinburgh Postnatal Depression Scale Total 0     After visit meds:  Allergies as of 06/27/2021   No Known Allergies      Medication List     STOP taking these medications    aspirin EC 81 MG tablet       TAKE these medications    acetaminophen 500 MG tablet Commonly known as: TYLENOL Take 2 tablets (1,000 mg total) by mouth every 6 (six) hours.   furosemide 20 MG tablet Commonly known as: Lasix Take 1 tablet (20 mg total) by mouth 2 (two) times daily.   ibuprofen 600 MG tablet Commonly known as: ADVIL Take 1 tablet (600 mg total) by mouth every 6 (six) hours.   oxyCODONE 5 MG immediate release tablet Commonly known as: Oxy IR/ROXICODONE Take 1-2 tablets (5-10 mg total) by mouth every 4 (four) hours as needed for moderate pain.   Vitafol Ultra 29-0.6-0.4-200 MG Caps Take 1 capsule by mouth daily before breakfast.               Discharge Care Instructions  (From admission, onward)           Start     Ordered   06/27/21 0000  Discharge wound care:       Comments: If dressing is present, remove after 7 days from surgery   06/27/21 1027             Discharge home in stable condition Infant Feeding: Bottle Infant Disposition:home with mother Discharge instruction: per After Visit Summary and Postpartum booklet. Activity: Advance as tolerated. Pelvic rest for 6 weeks.  Diet: routine diet Future Appointments:No future appointments. Follow up Visit:  [place] Follow up in 1 week(s).   Specialty: Obstetrics and Gynecology Why: Blood pressure and incision check Contact information: [address] [place] [phone]               Messages sent to [place] by Dr. Gwenlyn Perking on  06/25/21.   Please schedule this patient for a In person postpartum visit in 6 weeks with the following provider: Any provider. Additional Postpartum F/U: Incision check 1 week and BP check 1 week  High risk pregnancy complicated by: HTN and hx of cesarean section Delivery mode:  C-Section, Low Transverse  Anticipated Birth Control:  Nexplanon  06/27/2021 Radene Gunning, MD

## 2021-06-25 NOTE — Progress Notes (Signed)
Labor Progress Note Carmen Price is a 30 y.o. G2P1001 at [redacted]w[redacted]d presented for induced TOLAC for gHTN  S: Doing well. Feeling cramping.  O:  BP 132/67    Pulse 74    Temp 98.1 F (36.7 C) (Oral)    Resp 18    Ht 5\' 7"  (1.702 m)    Wt (!) 144 kg    LMP 09/20/2020    BMI 49.73 kg/m   FHT: FHR 125 bpm, moderate variability, + accels, no decels  CVE: Dilation: Fingertip Effacement (%): Thick Station: -3 Presentation: Vertex Exam by:: [email or web address] CNM   A&P: 30 y.o. G2P1001 [redacted]w[redacted]d  #Labor: FB in place. Continuing current management-  will stay on Pitocin at 8 milliunits/minute until FB comes out. #Pain: Per patient request. Doing well with breathing through contractions #FWB: Cat 1 #GBS negative #gHTN: BP 140s-150s/70s-90s. No severe ranges or s/sx's of pre-E.   #COVID: Asymptomatic.  [redacted]w[redacted]d, DO 12:34 AM

## 2021-06-25 NOTE — Progress Notes (Signed)
Carmen Price is a 30 y.o. G2P1001 at [redacted]w[redacted]d  admitted for TOLAC/induction of labor due to gHTN.  Subjective: Reports feeling some contractions  Objective: BP (!) 141/68    Pulse 97    Temp 98.1 F (36.7 C) (Oral)    Resp 18    Ht 5\' 7"  (1.702 m)    Wt (!) 144 kg    LMP 09/20/2020    BMI 49.73 kg/m  No intake/output data recorded. No intake/output data recorded.  FHT:  FHR: 130 bpm, variability: moderate,  accelerations:  Present,  decelerations:  Absent UC:   irregular, every 2-3 minutes SVE:   Dilation: 5 Effacement (%): 60 Station: -3 Exam by:: Dr. [email or web address]  Labs: Lab Results  Component Value Date   WBC 6.0 06/25/2021   HGB 10.4 (L) 06/25/2021   HCT 31.3 (L) 06/25/2021   MCV 92.1 06/25/2021   PLT 206 06/25/2021    Assessment / Plan: [redacted]w[redacted]d  admitted for TOLAC/induction of labor due to gHTN.  Labor:  foley balloon out, pitocin being uptitrated since FB out, now on 24. Cervix 5 cm and thick and -2 to -3. Essentially unchanged from check since foley balloon out (~ 4hrs). AROMed during current check. Patient and baby tolerated procedure well. IUPC placed  #gHTN BP mild range. Most recently 140s/60s. Will continue to monitor.  Fetal Wellbeing:  Category I Pain Control:  Labor support without medications I/D:   GBS neg   [redacted]w[redacted]d 06/25/2021, 7:14 AM

## 2021-06-25 NOTE — Anesthesia Preprocedure Evaluation (Signed)
Anesthesia Evaluation  Patient identified by MRN, date of birth, ID band Patient awake    Reviewed: Allergy & Precautions, H&P , NPO status , Patient's Chart, lab work & pertinent test results, reviewed documented beta blocker date and time   Airway Mallampati: II  TM Distance: >3 FB Neck ROM: full    Dental no notable dental hx.    Pulmonary neg pulmonary ROS, former smoker,    Pulmonary exam normal breath sounds clear to auscultation       Cardiovascular hypertension, Pt. on medications negative cardio ROS Normal cardiovascular exam Rhythm:regular Rate:Normal     Neuro/Psych  Headaches, PSYCHIATRIC DISORDERS Anxiety Depression    GI/Hepatic Neg liver ROS, GERD  ,  Endo/Other  Morbid obesity  Renal/GU negative Renal ROS  negative genitourinary   Musculoskeletal   Abdominal   Peds  Hematology  (+) Blood dyscrasia, anemia ,   Anesthesia Other Findings   Reproductive/Obstetrics (+) Pregnancy                             Anesthesia Physical Anesthesia Plan  ASA: 3 and emergent  Anesthesia Plan: Spinal   Post-op Pain Management:    Induction:   PONV Risk Score and Plan: 2 and Ondansetron, Treatment may vary due to age or medical condition and Scopolamine patch - Pre-op  Airway Management Planned: Natural Airway  Additional Equipment: None  Intra-op Plan:   Post-operative Plan:   Informed Consent: I have reviewed the patients History and Physical, chart, labs and discussed the procedure including the risks, benefits and alternatives for the proposed anesthesia with the patient or authorized representative who has indicated his/her understanding and acceptance.       Plan Discussed with: Anesthesiologist  Anesthesia Plan Comments:         Anesthesia Quick Evaluation

## 2021-06-25 NOTE — Op Note (Signed)
Carmen Price  PROCEDURE DATE: 06/25/2021  PREOPERATIVE DIAGNOSES: Intrauterine pregnancy at [redacted]w[redacted]d weeks gestation;  hx of cesarean section; desire for repeat cesarean section  POSTOPERATIVE DIAGNOSES: The same  PROCEDURE: Repeat Low Transverse Cesarean Section  SURGEON:  Dr. Milas Hock  ASSISTANT:  Dr. Evalina Field   ANESTHESIOLOGY TEAM: Anesthesiologist: Bethena Midget, MD CRNA: Rica Records, CRNA  INDICATIONS: Carmen Price is a 30 y.o. G2P1001 at [redacted]w[redacted]d here for cesarean section secondary to the indications listed under preoperative diagnoses; please see preoperative note for further details.  The risks of cesarean section were discussed with the patient including but were not limited to: bleeding which may require transfusion or reoperation; infection which may require antibiotics; injury to bowel, bladder, ureters or other surrounding organs; injury to the fetus; need for additional procedures including hysterectomy in the event of a life-threatening hemorrhage; placental abnormalities wth subsequent pregnancies, incisional problems, thromboembolic phenomenon and other postoperative/anesthesia complications.   The patient concurred with the proposed plan, giving informed written consent for the procedure.    FINDINGS:  Viable female infant in cephalic presentation.  Apgars 5, 8 and 9.  Clear amniotic fluid.  Intact placenta, three vessel cord.  Normal uterus.  Moderate adhesive disease between the fascia and rectus muscles.   ANESTHESIA: Spinal  INTRAVENOUS FLUIDS: 1000 ml   ESTIMATED BLOOD LOSS: 550 ml URINE OUTPUT:  25 ml SPECIMENS: Placenta sent to L&D COMPLICATIONS: None immediate  PROCEDURE IN DETAIL:   The patient preoperatively received intravenous antibiotics and had sequential compression devices applied to her lower extremities.  She was then taken to the operating room where spinal anesthesia was administered and was found to be adequate. She was then  placed in a dorsal supine position with a leftward tilt, and prepped and draped in a sterile manner.  A foley catheter was placed into her bladder and attached to constant gravity.    After an adequate timeout was performed, a Pfannenstiel skin incision was made with a scalpel on her preexisting scar and carried through to the underlying layer of fascia. The fascia was incised in the midline, and this incision was extended bilaterally using the Mayo scissors.  Kocher clamps were applied to the superior aspect of the fascial incision and the underlying rectus muscles were dissected off bluntly and sharply.  A similar process was carried out on the inferior aspect of the fascial incision. The rectus muscles were separated in the midline and the peritoneum was entered bluntly. The Alexis self-retaining retractor was introduced into the abdominal cavity.    Attention was turned to the lower uterine segment where a low transverse hysterotomy was made with a scalpel and extended bilaterally bluntly.  Unable to deliver the fetal head spontaneously with fundal pressure.  A Kiwi vacuum was applied and pulled with 3 pops due to poor seal given infant hair.  The left sided rectus muscles were transected approximately 2 cm to allow for additional exposure. A bell shaped Mityvac was then applied with successful delivery of the infant's head. The shoulders and body subsequently delivered in usual fashion, the cord was clamped and cut after 15 second delay, and the infant was handed over to the awaiting neonatology team. Uterine massage was then administered, and the placenta delivered intact with a three-vessel cord. The uterus was then cleared of clots and debris.    The hysterotomy was closed with 0 Vicryl in a running locked fashion, and an imbricating layer was also placed with 0 Vicryl.  Figure-of-eight 0  Vicryl serosal stitches were placed to help with hemostasis.  The pelvis was cleared of all clot and debris. The  retractor was removed.    The left sided rectus muscles were re-approximated with a box stitch.  Hemostasis was confirmed on all surfaces.    The peritoneum was closed with a 2-0 Vicryl running/purse string stitch. The fascia was then closed using 0 Vicryl in a running fashion. The subcutaneous layer was irrigated, re-approximated with 2-0 plain gut running stitch, and the skin was closed with a 4-0 Vicryl subcuticular stitch. The patient tolerated the procedure well. Sponge, instrument and needle counts were correct x 3.  She was taken to the recovery room in stable condition.   Evalina Field, MD  OB Fellow   [facility]

## 2021-06-25 NOTE — Progress Notes (Signed)
Carmen Price is a 30 y.o. G2P1001 at [redacted]w[redacted]d admitted for TOLAC/IOL due to gHTN.   Subjective: Pt requesting c-section. She is feeling her contractions painfully. She feels she has not made enough progress and is exhausted. She does not wish to continue to labor.   Objective: BP (!) 146/79    Pulse 80    Temp 98.3 F (36.8 C) (Oral)    Resp 16    Ht 5\' 7"  (1.702 m)    Wt (!) 144 kg    LMP 09/20/2020    BMI 49.73 kg/m  No intake/output data recorded. No intake/output data recorded.  FHT:  FHR: 140 bpm, variability: moderate,  accelerations:  Present,  decelerations:  Absent UC:   regular, every 2-3 minutes SVE:   Dilation: 5 Effacement (%): 60 Station: -3 Exam by:: Dr. [email or web address] Nurse rechecked recently and unchanged.    Labs: Lab Results  Component Value Date   WBC 6.0 06/25/2021   HGB 10.4 (L) 06/25/2021   HCT 31.3 (L) 06/25/2021   MCV 92.1 06/25/2021   PLT 206 06/25/2021    Assessment / Plan: Pt requesting c-section. She declines further labor attempts.    Labor: Progressing normally but she declines continued TOLAC gHTN:   Normal labs, Bps mild range.  Fetal Wellbeing:  Category I Pain Control:   Spinal for c-section I/D:   Ancef/Azithro  Anticipated MOD:   Plan is for repeat c-section. I turned off pitocin at 1130 am following confirmation of patient wishes and following consent process.   The risks of surgery were discussed with the patient including but were not limited to: bleeding which may require transfusion or reoperation; infection which may require antibiotics; injury to bowel, bladder, ureters or other surrounding organs; injury to the fetus; need for additional procedures including hysterectomy in the event of a life-threatening hemorrhage; formation of adhesions; placental abnormalities with subsequent pregnancies; incisional problems; thromboembolic phenomenon and other postoperative/anesthesia complications.    The patient concurred with the proposed plan,  giving informed written consent for the procedure.  Anesthesia and OR aware. Will do Ancef/Azithro for prophy antibiotics.  COVID pos - will proceed with standard precautions.   06/27/2021 06/25/2021, 11:41 AM

## 2021-06-25 NOTE — Anesthesia Procedure Notes (Signed)
Spinal  Patient location during procedure: OR Start time: 06/25/2021 2:25 PM End time: 06/25/2021 2:34 PM Reason for block: surgical anesthesia Staffing Anesthesiologist: Bethena Midget, MD Preanesthetic Checklist Completed: patient identified, IV checked, site marked, risks and benefits discussed, surgical consent, monitors and equipment checked, pre-op evaluation and timeout performed Spinal Block Patient position: sitting Prep: DuraPrep Patient monitoring: heart rate, cardiac monitor, continuous pulse ox and blood pressure Approach: midline Location: L2-3 Injection technique: single-shot Needle Needle type: Sprotte  Needle gauge: 24 G Needle length: 9 cm Assessment Sensory level: T4 Events: CSF return

## 2021-06-26 ENCOUNTER — Encounter (HOSPITAL_COMMUNITY): Payer: Self-pay | Admitting: Obstetrics and Gynecology

## 2021-06-26 LAB — CBC
HCT: 27.4 % — ABNORMAL LOW (ref 36.0–46.0)
Hemoglobin: 9 g/dL — ABNORMAL LOW (ref 12.0–15.0)
MCH: 30.3 pg (ref 26.0–34.0)
MCHC: 32.8 g/dL (ref 30.0–36.0)
MCV: 92.3 fL (ref 80.0–100.0)
Platelets: 210 10*3/uL (ref 150–400)
RBC: 2.97 MIL/uL — ABNORMAL LOW (ref 3.87–5.11)
RDW: 13.2 % (ref 11.5–15.5)
WBC: 11.4 10*3/uL — ABNORMAL HIGH (ref 4.0–10.5)
nRBC: 0 % (ref 0.0–0.2)

## 2021-06-26 NOTE — Progress Notes (Signed)
Post Partum Day 1 Subjective: POD 1. Doing well. no complaints, up ad lib, voiding, and tolerating PO  Objective: Blood pressure 102/62, pulse (!) 57, temperature 97.6 F (36.4 C), temperature source Oral, resp. rate 18, height 5\' 7"  (1.702 m), weight (!) 144 kg, last menstrual period 09/20/2020, SpO2 100 %, unknown if currently breastfeeding.  Physical Exam:  General: alert and cooperative Lochia: appropriate Uterine Fundus: firm Incision: no significant drainage DVT Evaluation: No evidence of DVT seen on physical exam.  Recent Labs    06/25/21 0505 06/26/21 0626  HGB 10.4* 9.0*  HCT 31.3* 27.4*    Assessment/Plan: Plan for discharge tomorrow or next day   LOS: 2 days   06/28/21 06/26/2021, 8:11 AM

## 2021-06-26 NOTE — Progress Notes (Signed)
CSW met with MOB to complete consult for mental health. CSW observed MOB resting in bed, and infant in bassinet. CSW explained role, and reason for consult. MOB was pleasant, and polite during engagement with CSW. MOB reported, history of ADHD, anxiety, and depression in middle school around the passing of her father. MOB reported, history of excessive crying, over thinking, racing thoughts, and isolation. MOB denied any history of psychotropic medication, and recent therapy involvement. MOB reported, she has been able to manage symptoms without medication. CSW encourage MOB to implement healthy coping skills when symptoms arises.  ° °CSW provided education regarding the baby blues period vs. perinatal mood disorders, discussed treatment and gave resources for mental health follow up if concerns arise. CSW recommends self- evaluation during the postpartum time period using the New Mom Checklist from Postpartum Progress and encouraged MOB to contact a medical professional if symptoms are noted at any time.  ° °MOB reported, since delivery she feels, "great". MOB reported, FOB, her mother, and best friend are very supportive. MOB denied SI, HI, and DV when CSW assessed for safety.  ° °MOB reported, there are no transportation barriers to follow up infant's care. MOB reported, she has all essentials needed to care for infant. MOB reported, infant has a car seat, and bassinet. MOB denied any additional barriers.   °  °CSW provided education on sudden infant death syndrome (SIDS). ° °CSW identifies no further need for intervention or barriers to discharge at this time. ° °Yeshua Stryker, MSW, LCSW-A °Clinical Social Worker- Weekends °[phone]  °

## 2021-06-27 DIAGNOSIS — Z30017 Encounter for initial prescription of implantable subdermal contraceptive: Secondary | ICD-10-CM

## 2021-06-27 MED ORDER — FUROSEMIDE 20 MG PO TABS: 20.0000 mg | ORAL_TABLET | Freq: Two times a day (BID) | ORAL | 0 refills | Status: DC

## 2021-06-27 MED ORDER — ACETAMINOPHEN 500 MG PO TABS: 1000.0000 mg | ORAL_TABLET | Freq: Four times a day (QID) | ORAL | 0 refills | Status: DC

## 2021-06-27 MED ORDER — ETONOGESTREL 68 MG ~~LOC~~ IMPL
68.0000 mg | DRUG_IMPLANT | Freq: Once | SUBCUTANEOUS | Status: AC
  Administered 2021-06-27: 11:00:00 68 mg via SUBCUTANEOUS
  Filled 2021-06-27: qty 1

## 2021-06-27 MED ORDER — LIDOCAINE HCL 1 % IJ SOLN
0.0000 mL | Freq: Once | INTRAMUSCULAR | Status: AC | PRN
  Administered 2021-06-27: 11:00:00 20 mL via INTRADERMAL
  Filled 2021-06-27: qty 20

## 2021-06-27 MED ORDER — IBUPROFEN 600 MG PO TABS: 600.0000 mg | ORAL_TABLET | Freq: Four times a day (QID) | ORAL | 0 refills | Status: DC

## 2021-06-27 MED ORDER — OXYCODONE HCL 5 MG PO TABS: 5.0000 mg | ORAL_TABLET | ORAL | 0 refills | Status: DC | PRN

## 2021-06-27 NOTE — Op Note (Addendum)
Nexplanon PROCEDURE NOTE  Carmen Price is a 30 y.o. [id number] for Nexplanon insertion.    Nexplanon Insertion Procedure Patient identified, informed consent performed, consent signed. Patient does understand that irregular bleeding is a very common side effect of this medication. She was advised to have backup contraception for one week after placement. Appropriate time out taken.    Patient's left arm was prepped and draped in the usual sterile fashion. The ruler used to measure and mark insertion area.  Patient was prepped with alcohol swab and then injected with 3 ml of 1% lidocaine.  She was prepped with betadine, Nexplanon removed from packaging,  Device confirmed in needle, then inserted full length of needle and withdrawn per handbook instructions. Nexplanon was able to palpated in the patient's arm; patient palpated the insert herself. There was minimal blood loss.  Patient insertion site covered with guaze and a pressure bandage to reduce any bruising.    The patient tolerated the procedure well and was given post procedure instructions.   Lot number [id number]  Milas Hock, MD, FACOG Obstetrician & Gynecologist, [facility], [facility]

## 2021-06-28 NOTE — Anesthesia Postprocedure Evaluation (Signed)
Anesthesia Post Note  Patient: Carmen Price  Procedure(s) Performed: CESAREAN SECTION     Patient location during evaluation: PACU Anesthesia Type: Spinal Level of consciousness: oriented and awake and alert Pain management: pain level controlled Vital Signs Assessment: post-procedure vital signs reviewed and stable Respiratory status: spontaneous breathing, respiratory function stable and patient connected to nasal cannula oxygen Cardiovascular status: blood pressure returned to baseline and stable Postop Assessment: no headache, no backache and no apparent nausea or vomiting Anesthetic complications: no   No notable events documented.  Last Vitals:  Vitals:   06/26/21 2121 06/27/21 0350  BP: 131/60 116/73  Pulse: 88 70  Resp: 18 16  Temp: 36.6 C 36.7 C  SpO2: 100% 100%    Last Pain:  Vitals:   06/27/21 1151  TempSrc:   PainSc: 0-No pain                 Zhania Shaheen

## 2021-07-02 ENCOUNTER — Other Ambulatory Visit: Payer: Self-pay

## 2021-07-02 ENCOUNTER — Encounter (HOSPITAL_COMMUNITY): Payer: Self-pay | Admitting: Obstetrics and Gynecology

## 2021-07-02 ENCOUNTER — Ambulatory Visit: Payer: Medicaid Other | Admitting: Obstetrics & Gynecology

## 2021-07-02 ENCOUNTER — Ambulatory Visit: Payer: Medicaid Other

## 2021-07-02 VITALS — BP 142/93 | HR 80 | Ht 67.0 in | Wt 305.2 lb

## 2021-07-02 DIAGNOSIS — Z8759 Personal history of other complications of pregnancy, childbirth and the puerperium: Secondary | ICD-10-CM

## 2021-07-02 MED ORDER — NIFEDIPINE ER OSMOTIC RELEASE 30 MG PO TB24
30.0000 mg | ORAL_TABLET | Freq: Every day | ORAL | 1 refills | Status: DC
Start: 2021-07-02 — End: 2024-05-23

## 2021-07-02 NOTE — Transfer of Care (Signed)
Immediate Anesthesia Transfer of Care Note  Patient: Carmen Price  Procedure(s) Performed: CESAREAN SECTION  Patient Location: PACU  Anesthesia Type:Regional  Level of Consciousness: awake, alert  and oriented  Airway & Oxygen Therapy: Patient Spontanous Breathing  Post-op Assessment: Report given to RN and Post -op Vital signs reviewed and stable  Post vital signs: Reviewed and stable  Last Vitals:  Vitals Value Taken Time  BP 142/93 07/02/21 0843  Temp 36.7 C 06/27/21 0350  Pulse 80 07/02/21 0843  Resp 16 06/27/21 0350  SpO2 100 % 06/27/21 0350    Last Pain:  Vitals:   06/27/21 1151  TempSrc:   PainSc: 0-No pain         Complications: No notable events documented.

## 2021-07-02 NOTE — Progress Notes (Signed)
Postpartum BP and incision check.  Doing well. Reports decreasing abdominal tenderness at site. Reports decreasing lochia. Baby doing well. Denies depression or anxiety symptoms.  Did not take BP med today yet. Denies headache, blurry vision, dizziness, heartburn, RUQ pain.  Honeycomb dressing removed. Old drainage only. Steri strips intact. Incision clean, dry and intact. Patient was assessed and managed by nursing staff during this encounter. I have reviewed the chart and agree with the documentation and plan. I have also made any necessary editorial changes.  Scheryl Darter, MD 07/02/2021 12:29 PM

## 2021-07-08 ENCOUNTER — Telehealth (HOSPITAL_COMMUNITY): Payer: Self-pay | Admitting: *Deleted

## 2021-07-08 NOTE — Telephone Encounter (Signed)
Attempted Hospital Discharge Follow-Up Call.  Left voice mail requesting that patient return RN's phone call.  

## 2021-07-27 ENCOUNTER — Other Ambulatory Visit: Payer: Self-pay | Admitting: *Deleted

## 2021-07-27 NOTE — Progress Notes (Signed)
Received TC from Rico Sheehan with [facility]. Reports patient BP 142/98 today. Patient denies HA, blurry vision, dizziness. TC to patient. Patient is not taking Procardia as directed on 07/02/21. Education provided. Advised to restart meds and return to office on 08/09/21 as scheduled for postpartum check up. Dr. Debroah Loop consulted and agrees.

## 2021-08-09 ENCOUNTER — Ambulatory Visit (INDEPENDENT_AMBULATORY_CARE_PROVIDER_SITE_OTHER): Payer: Medicaid Other | Admitting: Obstetrics and Gynecology

## 2021-08-09 ENCOUNTER — Encounter: Payer: Self-pay | Admitting: Obstetrics and Gynecology

## 2021-08-09 ENCOUNTER — Other Ambulatory Visit: Payer: Self-pay

## 2021-08-09 DIAGNOSIS — I10 Essential (primary) hypertension: Secondary | ICD-10-CM | POA: Diagnosis not present

## 2021-08-09 NOTE — Progress Notes (Signed)
..   [place] Partum Visit Note  Carmen Price is a 31 y.o. [id number] female who presents for a postpartum visit. She is 6 weeks postpartum following a repeat cesarean section.  I have fully reviewed the prenatal and intrapartum course. The delivery was at 38.3 gestational weeks.  Anesthesia: spinal. Postpartum course has been challenging from c section/BP. Baby is doing well. Baby is feeding by bottle Dory Horn . Bleeding staining only. Bowel function is normal. Bladder function is normal. Patient is not sexually active. Contraception method is Nexplanon. Postpartum depression screening: negative.   The pregnancy intention screening data noted above was reviewed. Potential methods of contraception were discussed. The patient elected to proceed with No data recorded.   Edinburgh Postnatal Depression Scale - 08/09/21 0824       Edinburgh Postnatal Depression Scale:  In the Past 7 Days   I have been able to laugh and see the funny side of things. 0    I have looked forward with enjoyment to things. 0    I have blamed myself unnecessarily when things went wrong. 0    I have been anxious or worried for no good reason. 0    I have felt scared or panicky for no good reason. 0    Things have been getting on top of me. 0    I have been so unhappy that I have had difficulty sleeping. 0    I have felt sad or miserable. 0    I have been so unhappy that I have been crying. 0    The thought of harming myself has occurred to me. 0    Edinburgh Postnatal Depression Scale Total 0             Health Maintenance Due  Topic Date Due   COVID-19 Vaccine (1) Never done       Review of Systems Pertinent items noted in HPI and remainder of comprehensive ROS otherwise negative.  Objective:  BP (!) 141/94    Pulse 81    Ht 5\' 7"  (1.702 m)    Wt 295 lb 4.8 oz (133.9 kg)    LMP 09/20/2020    Breastfeeding No    BMI 46.25 kg/m    General:  alert, cooperative, and no distress   Breasts:  normal  Lungs:  clear to auscultation bilaterally  Heart:  regular rate and rhythm  Abdomen: soft, non-tender; bowel sounds normal; no masses,  no organomegaly   Wound well approximated incision, healing well  GU exam:  not indicated       Assessment:    There are no diagnoses linked to this encounter.  Normal postpartum exam.   Plan:   Essential components of care per ACOG recommendations:  1.  Mood and well being: Patient with negative depression screening today. Reviewed local resources for support.  - Patient tobacco use? No.   - hx of drug use? No.    2. Infant care and feeding:  -Patient currently breastmilk feeding? No.  -Social determinants of health (SDOH) reviewed in EPIC. No concerns  3. Sexuality, contraception and birth spacing - Patient does not want a pregnancy in the next year.  Desired family size is 2 children.  - Reviewed forms of contraception in tiered fashion. Patient had Nexplanon inserted prior to discharge.   - Discussed birth spacing of 18 months  4. Sleep and fatigue -Encouraged family/partner/community support of 4 hrs of uninterrupted sleep to help with mood and fatigue  5.  Physical Recovery  - Discussed patients delivery and complications. She describes her labor as good. - Patient had a C-section. - Patient has urinary incontinence? No. - Patient is safe to resume physical and sexual activity  6.  Health Maintenance - HM due items addressed Yes - Last pap smear  Diagnosis  Date Value Ref Range Status  12/16/2020   Final   - Negative for intraepithelial lesion or malignancy (NILM)   Pap smear not done at today's visit.  -Breast Cancer screening indicated? No  7. Chronic Disease/Pregnancy Condition follow up: Hypertension Referral placed Continue procardia  - PCP follow up  Mora Bellman, [place] for [place]

## 2021-08-19 ENCOUNTER — Telehealth: Payer: Self-pay

## 2021-08-19 NOTE — Telephone Encounter (Signed)
Received voicemail has questions regarding menstrual cycle. LVM for pt to c/b

## 2021-09-09 ENCOUNTER — Encounter (HOSPITAL_COMMUNITY): Payer: Self-pay | Admitting: Emergency Medicine

## 2021-09-09 ENCOUNTER — Emergency Department (HOSPITAL_COMMUNITY)
Admission: EM | Admit: 2021-09-09 | Discharge: 2021-09-09 | Discharge status: Against Medical Advice | Payer: Medicaid Other | Attending: Student | Admitting: Student

## 2021-09-09 ENCOUNTER — Other Ambulatory Visit: Payer: Self-pay

## 2021-09-09 DIAGNOSIS — Z5321 Procedure and treatment not carried out due to patient leaving prior to being seen by health care provider: Secondary | ICD-10-CM | POA: Insufficient documentation

## 2021-09-09 DIAGNOSIS — R42 Dizziness and giddiness: Secondary | ICD-10-CM | POA: Insufficient documentation

## 2021-09-09 DIAGNOSIS — N939 Abnormal uterine and vaginal bleeding, unspecified: Secondary | ICD-10-CM | POA: Insufficient documentation

## 2021-09-09 DIAGNOSIS — R109 Unspecified abdominal pain: Secondary | ICD-10-CM | POA: Diagnosis not present

## 2021-09-09 LAB — CBC WITH DIFFERENTIAL/PLATELET
Abs Immature Granulocytes: 0.01 10*3/uL (ref 0.00–0.07)
Basophils Absolute: 0 10*3/uL (ref 0.0–0.1)
Basophils Relative: 1 %
Eosinophils Absolute: 0.1 10*3/uL (ref 0.0–0.5)
Eosinophils Relative: 1 %
HCT: 36.1 % (ref 36.0–46.0)
Hemoglobin: 11.4 g/dL — ABNORMAL LOW (ref 12.0–15.0)
Immature Granulocytes: 0 %
Lymphocytes Relative: 39 %
Lymphs Abs: 1.6 10*3/uL (ref 0.7–4.0)
MCH: 29 pg (ref 26.0–34.0)
MCHC: 31.6 g/dL (ref 30.0–36.0)
MCV: 91.9 fL (ref 80.0–100.0)
Monocytes Absolute: 0.3 10*3/uL (ref 0.1–1.0)
Monocytes Relative: 7 %
Neutro Abs: 2.1 10*3/uL (ref 1.7–7.7)
Neutrophils Relative %: 52 %
Platelets: 263 10*3/uL (ref 150–400)
RBC: 3.93 MIL/uL (ref 3.87–5.11)
RDW: 13.9 % (ref 11.5–15.5)
WBC: 4 10*3/uL (ref 4.0–10.5)
nRBC: 0 % (ref 0.0–0.2)

## 2021-09-09 LAB — BASIC METABOLIC PANEL
Anion gap: 7 (ref 5–15)
BUN: 7 mg/dL (ref 6–20)
CO2: 26 mmol/L (ref 22–32)
Calcium: 9 mg/dL (ref 8.9–10.3)
Chloride: 107 mmol/L (ref 98–111)
Creatinine, Ser: 0.67 mg/dL (ref 0.44–1.00)
GFR, Estimated: >60 mL/min (ref >60)
Glucose, Bld: 90 mg/dL (ref 70–99)
Potassium: 3.4 mmol/L — ABNORMAL LOW (ref 3.5–5.1)
Sodium: 140 mmol/L (ref 135–145)

## 2021-09-09 LAB — I-STAT BETA HCG BLOOD, ED (MC, WL, AP ONLY): I-stat hCG, quantitative: <5 m[IU]/mL (ref <5)

## 2021-09-09 LAB — TYPE AND SCREEN
ABO/RH(D): O POS
Antibody Screen: NEGATIVE

## 2021-09-09 LAB — PROTIME-INR
INR: 1 (ref 0.8–1.2)
Prothrombin Time: 13.1 seconds (ref 11.4–15.2)

## 2021-09-09 NOTE — ED Triage Notes (Signed)
Patient here with complaint of vaginal bleeding since December 2022 after delivering her child, changes her pad 5 times per day. Patient alert, oriented, and in no apparent distress at this time. ?

## 2021-09-09 NOTE — ED Provider Triage Note (Signed)
Emergency Medicine Provider Triage Evaluation Note ? ?Carmen Price , a 31 y.o. female  was evaluated in triage.  Pt complains of abnormal vaginal bleeding.  She states that since delivering her child on 06/25/2021 she has had a period.  She states that after the delivery of her child she had a Nexplanon placed on 12/25.  She states that she had continuous spotting through January and then after her 6-week postpartum check she has had ongoing heavier menstrual bleeding.  She states she uses about 5 pads a day.  She endorses lightheadedness without syncope.  She denies any chest pain, shortness of breath.  Has intermittent abdominal cramping.  She stated this happened years ago and she required blood transfusion. ? ?Review of Systems  ?Positive: See above ?Negative:  ? ?Physical Exam  ?BP 132/83 (BP Location: Left Arm)   Pulse 99   Temp 98.4 ?F (36.9 ?C) (Oral)   Resp 14   LMP 09/20/2020   SpO2 99%  ?Gen:   Awake, no distress   ?Resp:  Normal effort  ?MSK:   Moves extremities without difficulty  ?Other:  S1/S2 without murmur.  Borderline tachycardia. ? ?Medical Decision Making  ?Medically screening exam initiated at 11:56 AM.  Appropriate orders placed.  Carmen Price was informed that the remainder of the evaluation will be completed by another provider, this initial triage assessment does not replace that evaluation, and the importance of remaining in the ED until their evaluation is complete. ? ? ?  ?Cristopher Peru, PA-C ?09/09/21 1158 ? ?

## 2021-09-09 NOTE — ED Notes (Signed)
PT not answering call for room. 15:52 ?

## 2021-11-25 ENCOUNTER — Ambulatory Visit (INDEPENDENT_AMBULATORY_CARE_PROVIDER_SITE_OTHER): Payer: Medicaid Other | Admitting: Nurse Practitioner

## 2021-11-25 ENCOUNTER — Other Ambulatory Visit: Payer: Self-pay | Admitting: Nurse Practitioner

## 2021-11-25 DIAGNOSIS — Z131 Encounter for screening for diabetes mellitus: Secondary | ICD-10-CM | POA: Diagnosis not present

## 2021-11-25 DIAGNOSIS — Z13220 Encounter for screening for lipoid disorders: Secondary | ICD-10-CM

## 2021-11-25 DIAGNOSIS — I10 Essential (primary) hypertension: Secondary | ICD-10-CM | POA: Diagnosis not present

## 2021-11-25 DIAGNOSIS — Z6841 Body Mass Index (BMI) 40.0 and over, adult: Secondary | ICD-10-CM

## 2021-11-25 DIAGNOSIS — D509 Iron deficiency anemia, unspecified: Secondary | ICD-10-CM

## 2021-11-25 DIAGNOSIS — Z862 Personal history of diseases of the blood and blood-forming organs and certain disorders involving the immune mechanism: Secondary | ICD-10-CM | POA: Diagnosis not present

## 2021-11-25 DIAGNOSIS — E66813 Obesity, class 3: Secondary | ICD-10-CM

## 2021-11-25 DIAGNOSIS — Z136 Encounter for screening for cardiovascular disorders: Secondary | ICD-10-CM

## 2021-11-25 LAB — FERRITIN: Ferritin: 6.1 ng/mL — ABNORMAL LOW (ref 10.0–291.0)

## 2021-11-25 LAB — LIPID PANEL
Cholesterol: 127 mg/dL (ref 0–200)
HDL: 45.6 mg/dL (ref >39.00)
LDL Cholesterol: 60 mg/dL (ref 0–99)
NonHDL: 81.31
Total CHOL/HDL Ratio: 3
Triglycerides: 107 mg/dL (ref 0.0–149.0)
VLDL: 21.4 mg/dL (ref 0.0–40.0)

## 2021-11-25 LAB — COMPREHENSIVE METABOLIC PANEL
ALT: 13 U/L (ref 0–35)
AST: 16 U/L (ref 0–37)
Albumin: 4.2 g/dL (ref 3.5–5.2)
Alkaline Phosphatase: 63 U/L (ref 39–117)
BUN: 14 mg/dL (ref 6–23)
CO2: 29 mEq/L (ref 19–32)
Calcium: 9.4 mg/dL (ref 8.4–10.5)
Chloride: 105 mEq/L (ref 96–112)
Creatinine, Ser: 0.69 mg/dL (ref 0.40–1.20)
GFR: 116.21 mL/min (ref >60.00)
Glucose, Bld: 86 mg/dL (ref 70–99)
Potassium: 3.7 mEq/L (ref 3.5–5.1)
Sodium: 141 mEq/L (ref 135–145)
Total Bilirubin: 0.3 mg/dL (ref 0.2–1.2)
Total Protein: 7.8 g/dL (ref 6.0–8.3)

## 2021-11-25 LAB — HEMOGLOBIN A1C: Hgb A1c MFr Bld: 5.3 % (ref 4.6–6.5)

## 2021-11-25 LAB — CBC
HCT: 32.8 % — ABNORMAL LOW (ref 36.0–46.0)
Hemoglobin: 10.8 g/dL — ABNORMAL LOW (ref 12.0–15.0)
MCHC: 33 g/dL (ref 30.0–36.0)
MCV: 87.2 fl (ref 78.0–100.0)
Platelets: 251 10*3/uL (ref 150.0–400.0)
RBC: 3.76 Mil/uL — ABNORMAL LOW (ref 3.87–5.11)
RDW: 15.5 % (ref 11.5–15.5)
WBC: 5.7 10*3/uL (ref 4.0–10.5)

## 2021-11-25 LAB — IRON: Iron: 34 ug/dL — ABNORMAL LOW (ref 42–145)

## 2021-11-25 LAB — TSH: TSH: 0.6 u[IU]/mL (ref 0.35–5.50)

## 2021-11-25 MED ORDER — IRON (FERROUS SULFATE) 325 (65 FE) MG PO TABS: 325.0000 mg | ORAL_TABLET | Freq: Every day | ORAL | 0 refills | Status: DC

## 2021-11-25 NOTE — Progress Notes (Signed)
Subjective:  Patient ID: Carmen Price, female    DOB: [birth year]  Age: 31 y.o. MRN: [id number]  CC:  Chief Complaint  Patient presents with   New Patient (Initial Visit)      HPI  This patient arrives today for the above.  Her previous provider left her previous practice so she is establishing care today as a new patient.  She has no acute complaints today.  She has had anemia in the past related to abnormal uterine bleeding.  She is currently using Nexplanon for contraception, and reports that her periods seem to have regulated at this point.   She has also has a history of elevated blood pressure.  She continues on nifedipine 30 mg mouth daily and furosemide 20 mg mouth daily.  Is tolerating it well.  Past Medical History:  Diagnosis Date   Anemia    Anxiety    Blood transfusion during current hospitalization 02/03/2015   Blood transfusion without reported diagnosis    Depression    GERD (gastroesophageal reflux disease)    Headache    Pregnancy induced hypertension    Symptomatic anemia 02/03/2015   Vaginal Pap smear, abnormal       Family History  Problem Relation Age of Onset   Hypertension Mother    Arthritis Mother    Depression Mother    Diabetes Father    Diabetes Maternal Grandmother    Cancer Maternal Grandmother    Hypertension Maternal Grandmother    Kidney disease Maternal Grandmother    Asthma Brother    ADD / ADHD Maternal Uncle    Diabetes Maternal Uncle    Hypertension Maternal Uncle     Social History   Social History Narrative   Not on file   Social History   Tobacco Use   Smoking status: Former    Packs/day: 0.25    Years: 4.00    Pack years: 1.00    Types: Cigarettes   Smokeless tobacco: Never  Substance Use Topics   Alcohol use: Not Currently    Alcohol/week: 0.0 standard drinks    Comment: not since confirmed pregnancy     Current Meds  Medication Sig   acetaminophen (TYLENOL) 500 MG tablet Take 2 tablets (1,000  mg total) by mouth every 6 (six) hours.   Etonogestrel (NEXPLANON [place]) Inject into the skin.   furosemide (LASIX) 20 MG tablet Take 1 tablet (20 mg total) by mouth 2 (two) times daily.   NIFEdipine (PROCARDIA XL) 30 MG 24 hr tablet Take 1 tablet (30 mg total) by mouth daily.    ROS:  Review of Systems  Constitutional:  Negative for fever, malaise/fatigue and weight loss.  Respiratory:  Negative for cough, shortness of breath and wheezing.   Cardiovascular:  Negative for chest pain and palpitations.  Gastrointestinal:  Negative for abdominal pain, blood in stool, diarrhea, nausea and vomiting.  Neurological:  Negative for dizziness and loss of consciousness.  Psychiatric/Behavioral:  Negative for depression and suicidal ideas.     Objective:   Today's Vitals: BP 124/80 (BP Location: Left Arm, Patient Position: Sitting, Cuff Size: Large)   Pulse 79   Temp 98.6 F (37 C) (Oral)   Ht 5\' 7"  (1.702 m)   Wt (!) 302 lb 12.8 oz (137.3 kg)   LMP 10/25/2021   SpO2 98%   BMI 47.43 kg/m     11/25/2021    2:12 PM 09/09/2021    2:38 PM 09/09/2021   11:21 AM  Vitals with BMI  Height 5\' 7"     Weight 302 lbs 13 oz    BMI 47.41    Systolic 124 130  Diastolic 80 79 83  Pulse 79 91 99     Physical Exam Vitals reviewed.  Constitutional:      General: She is not in acute distress.    Appearance: Normal appearance.  HENT:     Head: Normocephalic and atraumatic.  Neck:     Vascular: No carotid bruit.  Cardiovascular:     Rate and Rhythm: Normal rate and regular rhythm.     Pulses: Normal pulses.     Heart sounds: Normal heart sounds.  Pulmonary:     Effort: Pulmonary effort is normal.     Breath sounds: Normal breath sounds.  Skin:    General: Skin is warm and dry.  Neurological:     General: No focal deficit present.     Mental Status: She is alert and oriented to person, place, and time.  Psychiatric:        Mood and Affect: Mood normal.        Behavior: Behavior normal.         Judgment: Judgment normal.         Assessment and Plan   1. Class 3 severe obesity without serious comorbidity with body mass index (BMI) of 45.0 to 49.9 in adult, unspecified obesity type (HCC)   2. Encounter for lipid screening for cardiovascular disease   3. Diabetes mellitus screening   4. History of anemia   5. Hypertension, unspecified type      Plan: 1.-5.  Blood work ordered today for further evaluation, further recommendations may be made based upon these results.  Blood pressure at goal today, patient will continue on medication as currently prescribed.   Tests ordered Orders Placed This Encounter  Procedures   TSH   Hemoglobin A1c   Lipid panel   Comprehensive metabolic panel   CBC   Ferritin   Iron      No orders of the defined types were placed in this encounter.   Patient to follow-up in 1 to 3 months for annual physical exam, or sooner as needed.  144, NP

## 2022-01-05 ENCOUNTER — Other Ambulatory Visit: Payer: Medicaid Other

## 2022-01-06 ENCOUNTER — Other Ambulatory Visit (INDEPENDENT_AMBULATORY_CARE_PROVIDER_SITE_OTHER): Payer: Medicaid Other

## 2022-01-06 DIAGNOSIS — D509 Iron deficiency anemia, unspecified: Secondary | ICD-10-CM

## 2022-01-06 LAB — IRON: Iron: 54 ug/dL (ref 42–145)

## 2022-01-06 LAB — FERRITIN: Ferritin: 15.2 ng/mL (ref 10.0–291.0)

## 2022-01-06 LAB — CBC
HCT: 35 % — ABNORMAL LOW (ref 36.0–46.0)
Hemoglobin: 11.6 g/dL — ABNORMAL LOW (ref 12.0–15.0)
MCHC: 33 g/dL (ref 30.0–36.0)
MCV: 88.3 fl (ref 78.0–100.0)
Platelets: 233 10*3/uL (ref 150.0–400.0)
RBC: 3.97 Mil/uL (ref 3.87–5.11)
RDW: 15.2 % (ref 11.5–15.5)
WBC: 4.1 10*3/uL (ref 4.0–10.5)

## 2022-02-25 ENCOUNTER — Encounter: Payer: Self-pay | Admitting: Nurse Practitioner

## 2022-02-25 ENCOUNTER — Ambulatory Visit (INDEPENDENT_AMBULATORY_CARE_PROVIDER_SITE_OTHER): Payer: Medicaid Other | Admitting: Nurse Practitioner

## 2022-02-25 VITALS — BP 122/84 | HR 84 | Temp 98.0°F | Ht 67.0 in | Wt 303.1 lb

## 2022-02-25 DIAGNOSIS — D509 Iron deficiency anemia, unspecified: Secondary | ICD-10-CM

## 2022-02-25 DIAGNOSIS — Z0001 Encounter for general adult medical examination with abnormal findings: Secondary | ICD-10-CM | POA: Diagnosis not present

## 2022-02-25 LAB — CBC
HCT: 35.5 % — ABNORMAL LOW (ref 36.0–46.0)
Hemoglobin: 11.8 g/dL — ABNORMAL LOW (ref 12.0–15.0)
MCHC: 33.2 g/dL (ref 30.0–36.0)
MCV: 89.7 fl (ref 78.0–100.0)
Platelets: 238 10*3/uL (ref 150.0–400.0)
RBC: 3.96 Mil/uL (ref 3.87–5.11)
RDW: 15 % (ref 11.5–15.5)
WBC: 6.5 10*3/uL (ref 4.0–10.5)

## 2022-02-25 LAB — IRON: Iron: 20 ug/dL — ABNORMAL LOW (ref 42–145)

## 2022-02-25 LAB — FERRITIN: Ferritin: 16 ng/mL (ref 10.0–291.0)

## 2022-02-25 NOTE — Assessment & Plan Note (Signed)
We will recheck CBC, iron, and ferritin, further recommendations may be made based upon the results.  In the meantime patient will continue taking her over-the-counter iron supplement.

## 2022-02-25 NOTE — Assessment & Plan Note (Signed)
Patient encouraged to follow a healthy diet as well as to participate in 150 minutes of exercise per week.  She was offered flu shot today but declined.  She was encouraged to me know if she changes her mind.

## 2022-02-25 NOTE — Progress Notes (Signed)
   Established Patient Office Visit  Subjective   Patient ID: Carmen Price, female    DOB: [birth year]  Age: 31 y.o. MRN: [id number]  Chief Complaint  Patient presents with   Annual Exam    Patient up-to-date on routine screenings recommended for a female of her age.  She does have iron deficiency anemia and continues on iron supplement.  She is due to have CBC, iron, and ferritin level rechecked today.  She has no acute concerns today.    Review of Systems  Constitutional:  Negative for fever, malaise/fatigue and weight loss.  Respiratory:  Negative for cough and shortness of breath.   Cardiovascular:  Negative for chest pain and palpitations.  Gastrointestinal:  Negative for abdominal pain and blood in stool.  Neurological:  Negative for dizziness and loss of consciousness.  Psychiatric/Behavioral:  Negative for depression and suicidal ideas.       Objective:     BP 122/84 (BP Location: Left Arm, Patient Position: Sitting, Cuff Size: Large)   Pulse 84   Temp 98 F (36.7 C) (Oral)   Ht 5\' 7"  (1.702 m)   Wt (!) 303 lb 2 oz (137.5 kg)   LMP 02/22/2022   SpO2 98%   BMI 47.48 kg/m    Physical Exam     02/25/2022    3:27 PM 06/08/2021    9:39 AM 12/14/2020    3:09 PM  PHQ9 SCORE ONLY  PHQ-9 Total Score 0 0 0    No results found for any visits on 02/25/22.    The ASCVD Risk score (Arnett DK, et al., 2019) failed to calculate for the following reasons:   The 2019 ASCVD risk score is only valid for ages 71 to 43    Assessment & Plan:   Problem List Items Addressed This Visit       Other   Encounter for general adult medical examination with abnormal findings - Primary    Patient encouraged to follow a healthy diet as well as to participate in 150 minutes of exercise per week.  She was offered flu shot today but declined.  She was encouraged to me know if she changes her mind.      Iron deficiency anemia    We will recheck CBC, iron, and ferritin,  further recommendations may be made based upon the results.  In the meantime patient will continue taking her over-the-counter iron supplement.      Relevant Orders   CBC   Iron   Ferritin    Return in about 6 months (around 08/28/2022) for F/u With Alysiana Ethridge.    08/30/2022, NP

## 2022-03-01 ENCOUNTER — Encounter: Payer: Self-pay | Admitting: Nurse Practitioner

## 2022-05-08 IMAGING — US US MFM OB FOLLOW-UP
1 series · 14 of 28 positions shown · non-contrast
Comparison: none

[Series 1: us mfm ob follow-up · 14 of 40 slices shown]
[im 2/40]
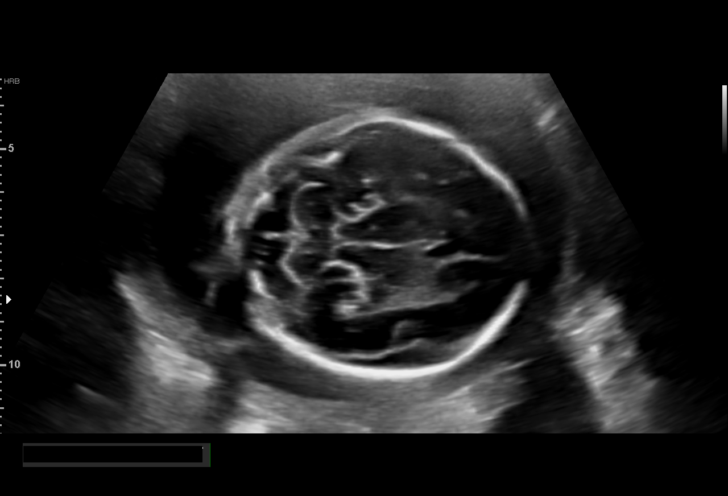
[im 5/40]
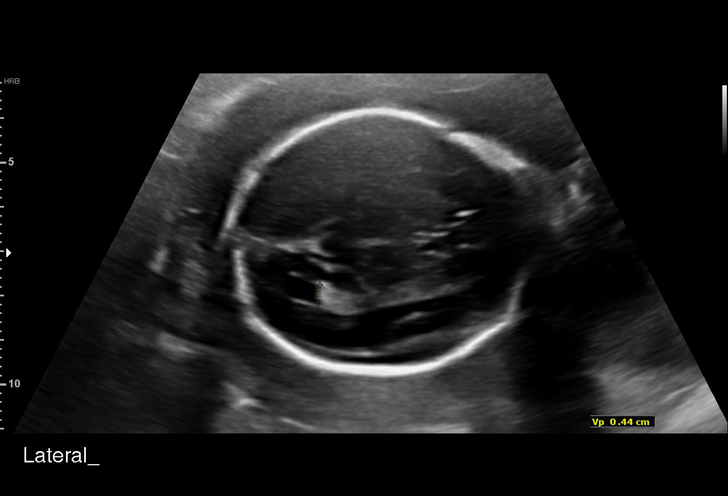
[im 8/40]
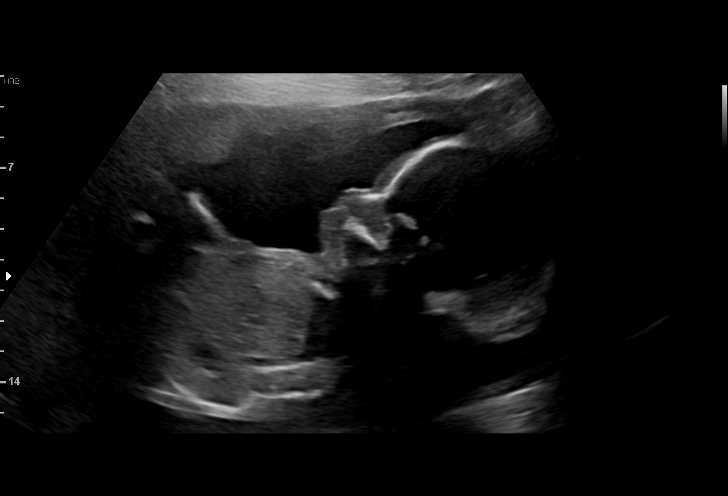
[im 11/40]
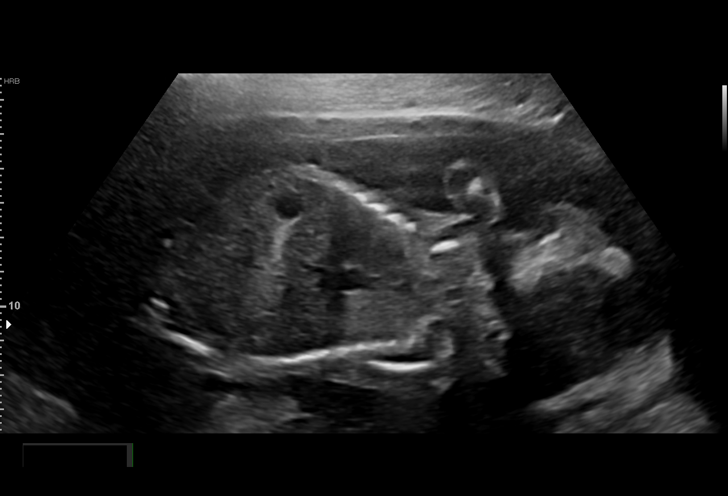
[im 14/40]
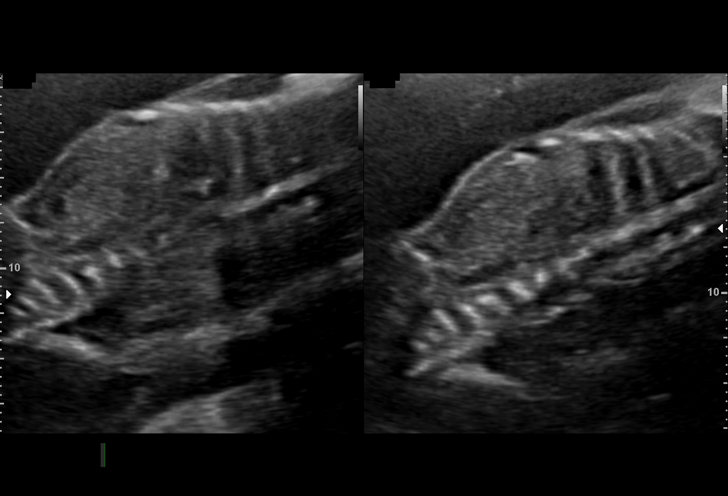
[im 16/40]
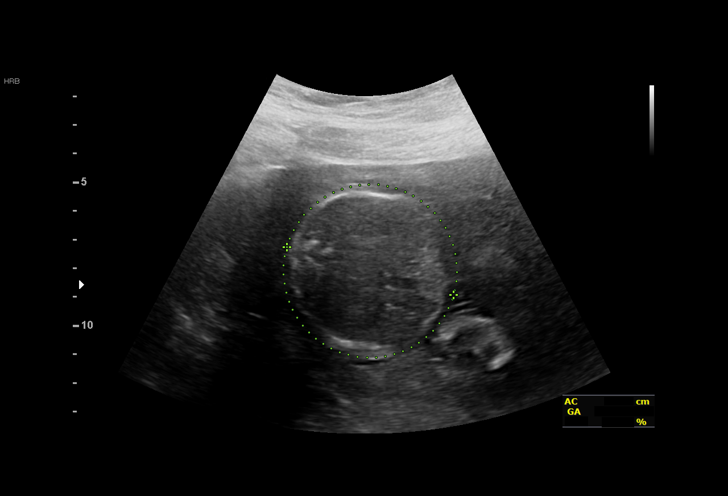
[im 19/40]
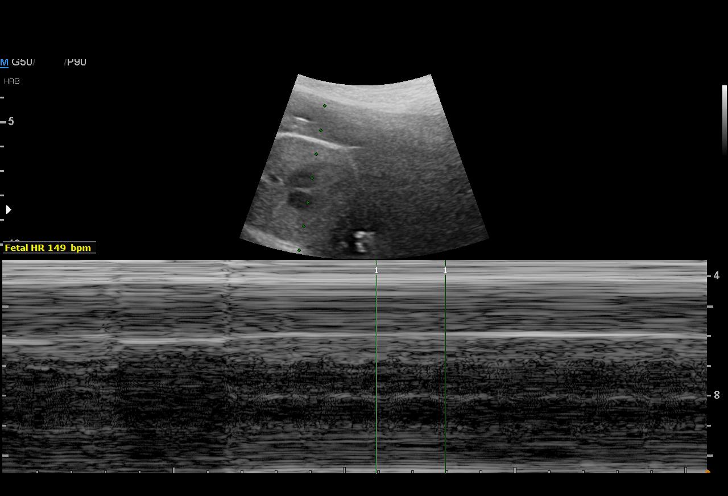
[im 22/40]
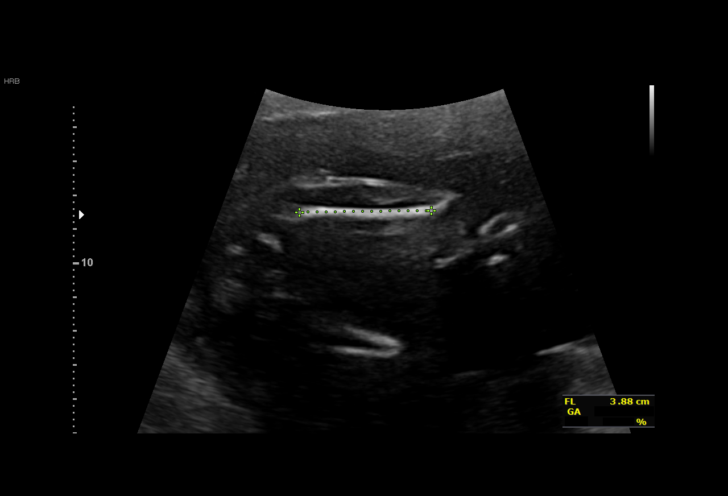
[im 25/40]
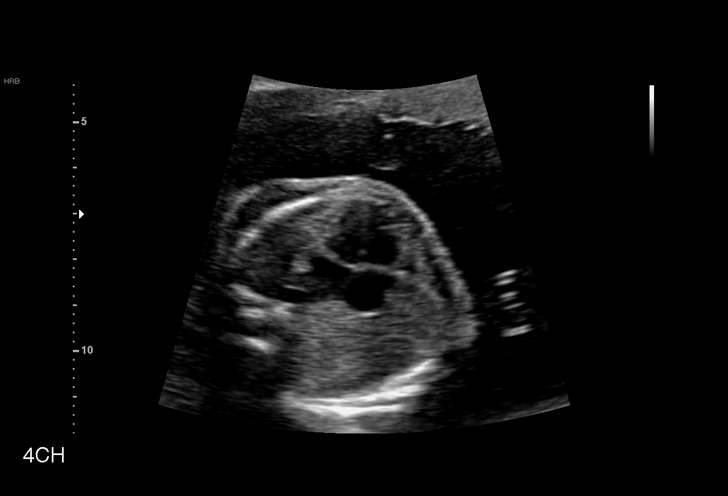
[im 28/40]
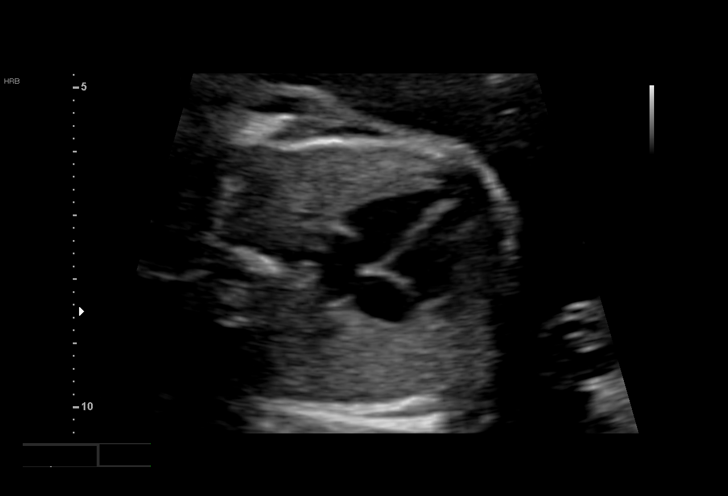
[im 31/40]
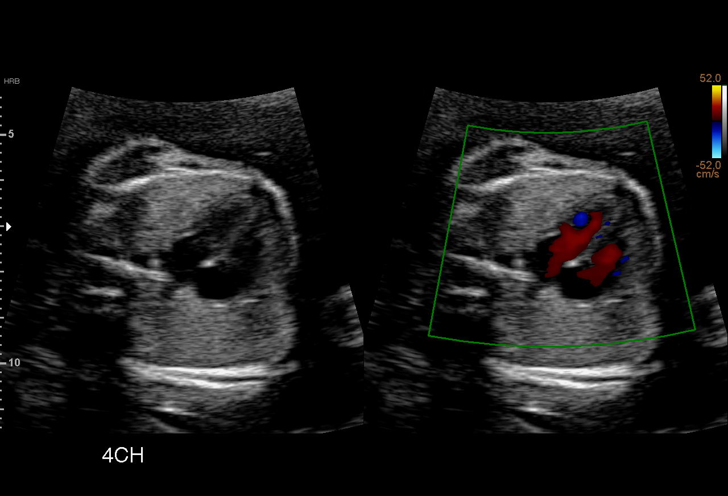
[im 34/40]
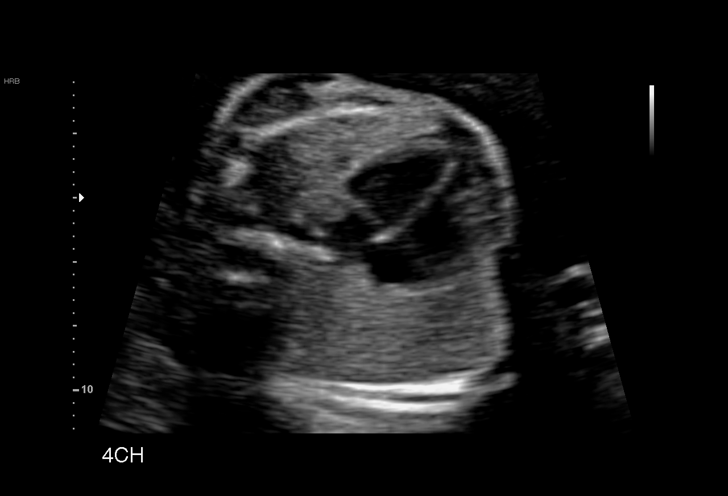
[im 37/40]
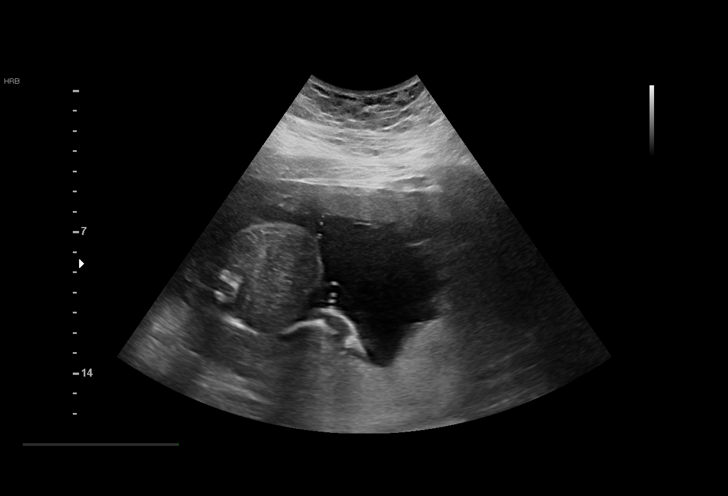
[im 40/40]
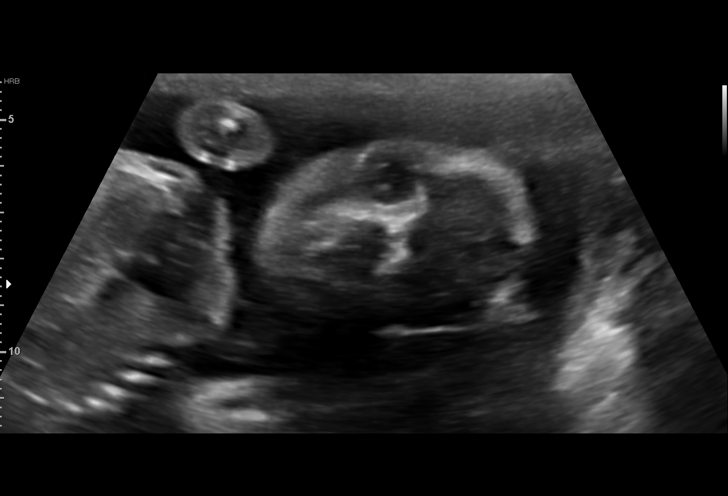

[14 of 28 positions shown; findings below may reference images not displayed]

Indications

 Encounter for other antenatal screening
 follow-up
 Maternal morbid obesity
 Negative AFP  Low risk NIPS
 23 weeks gestation of pregnancy
Vital Signs

                                                Height:        5'7"
Fetal Evaluation

 Num Of Fetuses:         1
 Fetal Heart Rate(bpm):  149
 Cardiac Activity:       Observed
 Presentation:           Cephalic
 Placenta:               Anterior
 P. Cord Insertion:      Previously Visualized

 Amniotic Fluid
 AFI FV:      Within normal limits
Biometry

 BPD:      58.1  mm     G. Age:  23w 6d         75  %    CI:        77.91   %    70 - 86
                                                         FL/HC:      18.3   %    19.2 -
 HC:      208.3  mm     G. Age:  22w 6d         32  %    HC/AC:      1.09        1.05 -
 AC:      191.7  mm     G. Age:  23w 6d         70  %    FL/BPD:     65.7   %    71 - 87
 FL:       38.2  mm     G. Age:  22w 2d         17  %    FL/AC:      19.9   %    20 - 24
 CER:        24  mm     G. Age:  22w 1d         50  %
 LV:        4.4  mm
 CM:        7.3  mm

 Est. FW:     570  gm      1 lb 4 oz     51  %
OB History

 Gravidity:    1
Gestational Age

 Clinical EDD:  23w 0d                                        EDD:   06/24/20
 U/S Today:     23w 2d                                        EDD:   06/22/20
 Best:          23w 0d     Det. By:  Clinical EDD             EDD:   06/24/20
Anatomy

 Cranium:               Appears normal         Aortic Arch:            Not well visualized
 Cavum:                 Appears normal         Ductal Arch:            Not well visualized
 Ventricles:            Appears normal         Diaphragm:              Appears normal
 Choroid Plexus:        Previously seen        Stomach:                Appears normal, left
                                                                       sided
 Cerebellum:            Previously seen        Abdomen:                Appears normal
 Posterior Fossa:       Appears normal         Abdominal Wall:         Previously seen
 Nuchal Fold:           Not applicable (>20    Cord Vessels:           Previously seen
                        wks GA)
 Face:                  Orbits and profile     Kidneys:                Appear normal
                        previously seen
 Lips:                  Appears normal         Bladder:                Appears normal
 Thoracic:              Appears normal         Spine:                  Not well visualized
 Heart:                 Appears normal         Upper Extremities:      Previously seen
                        (4CH, axis, and
                        situs)
 RVOT:                  Previously seen        Lower Extremities:      Previously seen
 LVOT:                  Previously seen

 Other:  Technically difficult due to maternal habitus.
Cervix Uterus Adnexa

 Cervix
 Length:           3.81  cm.
 Normal appearance by transabdominal scan.
Impression

 Patient returned for completion of fetal anatomy .Amniotic
 fluid is normal and good fetal activity is seen .Fetal growth is
 appropriate for gestational age .  Intracranial structures
 appear normal.Fetal anatomical survey was completed
 (except arches and fetal spine) and appears normal.
Recommendations

 -An appointment was made for her to return in 4 weeks for
 fetal growth assessment (maternal obesity) and to evaluate
 spine if feasible.
                 Jim, [place]

## 2022-09-01 ENCOUNTER — Ambulatory Visit: Payer: Medicaid Other | Admitting: Nurse Practitioner

## 2022-09-16 ENCOUNTER — Ambulatory Visit (INDEPENDENT_AMBULATORY_CARE_PROVIDER_SITE_OTHER): Payer: Medicaid Other | Admitting: Nurse Practitioner

## 2022-09-16 VITALS — BP 114/84 | HR 100 | Temp 98.2°F | Ht 67.0 in | Wt 309.4 lb

## 2022-09-16 DIAGNOSIS — D649 Anemia, unspecified: Secondary | ICD-10-CM

## 2022-09-16 DIAGNOSIS — Z6841 Body Mass Index (BMI) 40.0 and over, adult: Secondary | ICD-10-CM

## 2022-09-16 LAB — TSH: TSH: 0.69 u[IU]/mL (ref 0.35–5.50)

## 2022-09-16 LAB — CBC
HCT: 38 % (ref 36.0–46.0)
Hemoglobin: 12.6 g/dL (ref 12.0–15.0)
MCHC: 33.1 g/dL (ref 30.0–36.0)
MCV: 90.5 fl (ref 78.0–100.0)
Platelets: 281 10*3/uL (ref 150.0–400.0)
RBC: 4.19 Mil/uL (ref 3.87–5.11)
RDW: 14.3 % (ref 11.5–15.5)
WBC: 6.6 10*3/uL (ref 4.0–10.5)

## 2022-09-16 LAB — HEMOGLOBIN A1C: Hgb A1c MFr Bld: 5.5 % (ref 4.6–6.5)

## 2022-09-16 LAB — BASIC METABOLIC PANEL
BUN: 13 mg/dL (ref 6–23)
CO2: 27 mEq/L (ref 19–32)
Calcium: 9.6 mg/dL (ref 8.4–10.5)
Chloride: 104 mEq/L (ref 96–112)
Creatinine, Ser: 0.66 mg/dL (ref 0.40–1.20)
GFR: 116.79 mL/min (ref >60.00)
Glucose, Bld: 93 mg/dL (ref 70–99)
Potassium: 3.9 mEq/L (ref 3.5–5.1)
Sodium: 138 mEq/L (ref 135–145)

## 2022-09-16 LAB — IRON: Iron: 33 ug/dL — ABNORMAL LOW (ref 42–145)

## 2022-09-16 LAB — LIPID PANEL
Cholesterol: 132 mg/dL (ref 0–200)
HDL: 38.6 mg/dL — ABNORMAL LOW (ref >39.00)
LDL Cholesterol: 70 mg/dL (ref 0–99)
NonHDL: 93.38
Total CHOL/HDL Ratio: 3
Triglycerides: 119 mg/dL (ref 0.0–149.0)
VLDL: 23.8 mg/dL (ref 0.0–40.0)

## 2022-09-16 LAB — FERRITIN: Ferritin: 27.4 ng/mL (ref 10.0–291.0)

## 2022-09-16 NOTE — Progress Notes (Signed)
   Established Patient Office Visit  Subjective   Patient ID: Carmen Price, female    DOB: 1991-01-04  Age: 32 y.o. MRN: [id number]  Chief Complaint  Patient presents with   Medical Management of Chronic Issues    6 month follow up, no new concerns    IDA: On daily iron supplement.  Tolerates well, denies GI upset or constipation.  Due to have levels rechecked today. Patient has no other complaints today.    Review of Systems  Eyes:  Negative for blurred vision.  Respiratory:  Negative for shortness of breath.   Cardiovascular:  Negative for chest pain.  Neurological:  Negative for dizziness and headaches.      Objective:     BP 114/84   Pulse 100   Temp 98.2 F (36.8 C) (Temporal)   Ht 5\' 7"  (1.702 m)   Wt (!) 309 lb 6 oz (140.3 kg)   BMI 48.45 kg/m  BP Readings from Last 3 Encounters:  09/16/22 114/84  02/25/22 122/84  11/25/21 124/80   Wt Readings from Last 3 Encounters:  09/16/22 (!) 309 lb 6 oz (140.3 kg)  02/25/22 (!) 303 lb 2 oz (137.5 kg)  11/25/21 (!) 302 lb 12.8 oz (137.3 kg)      Physical Exam Vitals reviewed.  Constitutional:      General: She is not in acute distress.    Appearance: Normal appearance.  HENT:     Head: Normocephalic and atraumatic.  Neck:     Vascular: No carotid bruit.  Cardiovascular:     Rate and Rhythm: Normal rate and regular rhythm.     Pulses: Normal pulses.     Heart sounds: Normal heart sounds.  Pulmonary:     Effort: Pulmonary effort is normal.     Breath sounds: Normal breath sounds.  Skin:    General: Skin is warm and dry.  Neurological:     General: No focal deficit present.     Mental Status: She is alert and oriented to person, place, and time.  Psychiatric:        Mood and Affect: Mood normal.        Behavior: Behavior normal.        Judgment: Judgment normal.      No results found for any visits on 09/16/22.    The ASCVD Risk score (Arnett DK, et al., 2019) failed to calculate for the  following reasons:   The 2019 ASCVD risk score is only valid for ages 41 to 9    Assessment & Plan:   Problem List Items Addressed This Visit       Other   Class 3 severe obesity without serious comorbidity with body mass index (BMI) of 45.0 to 49.9 in adult [facility])    Labs ordered today, further recommendations may be made based upon these results.      Relevant Orders   Basic metabolic panel   Lipid panel   Hemoglobin A1c   TSH   Anemia - Primary    Labs ordered today, further recommendations may be made based upon his results.      Relevant Orders   CBC   Iron   Ferritin    Return in about 6 months (around 03/19/2023) for  , CPE with Judson Roch, after 02/26/23.    Ailene Ards, NP

## 2022-09-16 NOTE — Assessment & Plan Note (Signed)
Labs ordered today, further recommendations may be made based upon these results. 

## 2022-09-16 NOTE — Assessment & Plan Note (Signed)
Labs ordered today, further recommendations may be made based upon his results. 

## 2023-02-16 ENCOUNTER — Encounter (INDEPENDENT_AMBULATORY_CARE_PROVIDER_SITE_OTHER): Payer: Self-pay

## 2023-03-16 ENCOUNTER — Ambulatory Visit: Payer: Medicaid Other | Admitting: Nurse Practitioner

## 2023-04-06 ENCOUNTER — Encounter: Payer: Self-pay | Admitting: Nurse Practitioner

## 2023-04-06 ENCOUNTER — Ambulatory Visit: Payer: Medicaid Other | Admitting: Nurse Practitioner

## 2023-04-06 VITALS — BP 122/80 | HR 86 | Temp 98.1°F | Ht 67.0 in | Wt 312.5 lb

## 2023-04-06 DIAGNOSIS — E66813 Obesity, class 3: Secondary | ICD-10-CM

## 2023-04-06 DIAGNOSIS — Z6841 Body Mass Index (BMI) 40.0 and over, adult: Secondary | ICD-10-CM

## 2023-04-06 DIAGNOSIS — D509 Iron deficiency anemia, unspecified: Secondary | ICD-10-CM

## 2023-04-06 DIAGNOSIS — Z0000 Encounter for general adult medical examination without abnormal findings: Secondary | ICD-10-CM

## 2023-04-06 DIAGNOSIS — Z0001 Encounter for general adult medical examination with abnormal findings: Secondary | ICD-10-CM

## 2023-04-06 LAB — BASIC METABOLIC PANEL
BUN: 10 mg/dL (ref 6–23)
CO2: 29 meq/L (ref 19–32)
Calcium: 9.2 mg/dL (ref 8.4–10.5)
Chloride: 104 meq/L (ref 96–112)
Creatinine, Ser: 0.6 mg/dL (ref 0.40–1.20)
GFR: 119.04 mL/min (ref >60.00)
Glucose, Bld: 85 mg/dL (ref 70–99)
Potassium: 3.8 meq/L (ref 3.5–5.1)
Sodium: 139 meq/L (ref 135–145)

## 2023-04-06 LAB — CBC
HCT: 35.5 % — ABNORMAL LOW (ref 36.0–46.0)
Hemoglobin: 11.5 g/dL — ABNORMAL LOW (ref 12.0–15.0)
MCHC: 32.4 g/dL (ref 30.0–36.0)
MCV: 91.9 fL (ref 78.0–100.0)
Platelets: 220 10*3/uL (ref 150.0–400.0)
RBC: 3.86 Mil/uL — ABNORMAL LOW (ref 3.87–5.11)
RDW: 14.3 % (ref 11.5–15.5)
WBC: 5.5 10*3/uL (ref 4.0–10.5)

## 2023-04-06 LAB — IRON: Iron: 38 ug/dL — ABNORMAL LOW (ref 42–145)

## 2023-04-06 LAB — FERRITIN: Ferritin: 18.8 ng/mL (ref 10.0–291.0)

## 2023-04-06 NOTE — Assessment & Plan Note (Signed)
Discussed routine health screenings, healthy lifestyle, and immunizations.  Handout provided.  Patient has declined flu shot today.

## 2023-04-06 NOTE — Assessment & Plan Note (Signed)
Chronic Continues on iron supplement Labs ordered today further recommendations may be made based upon his results.

## 2023-04-06 NOTE — Assessment & Plan Note (Signed)
Chronic Discussed healthy lifestyle including diet and exercise aimed at steady weight loss.  Patient reports understanding.

## 2023-04-06 NOTE — Progress Notes (Signed)
Complete physical exam  Patient: Carmen Price   DOB: 1991-06-26   32 y.o. Female  MRN: [id number]  Subjective:    Chief Complaint  Patient presents with   Annual Exam    Carmen Price is a 32 y.o. female who presents today for a complete physical exam. She reports consuming a general diet.  Exercise: none outside of work, but walks a lot with working.  She generally feels well. She reports sleeping well. She does not have additional problems to discuss today.    Most recent fall risk assessment:    04/06/2023    1:21 PM  Fall Risk   Falls in the past year? 0  Number falls in past yr: 0  Injury with Fall? 0  Risk for fall due to : No Fall Risks  Follow up Falls evaluation completed     Most recent depression screenings:    04/06/2023    1:21 PM 09/16/2022    1:58 PM  PHQ 2/9 Scores  PHQ - 2 Score 0 0    Vision:Within last year and Dental: No current dental problems and Receives regular dental care  Past Medical History:  Diagnosis Date   Anemia    Anxiety    Blood transfusion during current hospitalization 02/03/2015   Blood transfusion without reported diagnosis    Depression    GERD (gastroesophageal reflux disease)    Gestational hypertension 06/24/2021   Headache    Pregnancy induced hypertension    Previous cesarean delivery affecting pregnancy, antepartum 01/11/2021   Symptomatic anemia 02/03/2015   Vaginal Pap smear, abnormal    Past Surgical History:  Procedure Laterality Date   CESAREAN SECTION N/A 06/05/2020   Procedure: CESAREAN SECTION;  Surgeon: Conan Bowens, MD;  Location: [facility];  Service: Obstetrics;  Laterality: N/A;   CESAREAN SECTION N/A 06/25/2021   Procedure: CESAREAN SECTION;  Surgeon: Milas Hock, MD;  Location: [facility];  Service: Obstetrics;  Laterality: N/A;   TONSILLECTOMY     Social History   Socioeconomic History   Marital status: Single    Spouse name: Not on file   Number of children: Not on file   Years of  education: Not on file   Highest education level: Not on file  Occupational History   Not on file  Tobacco Use   Smoking status: Former    Current packs/day: 0.25    Average packs/day: 0.3 packs/day for 4.0 years (1.0 ttl pk-yrs)    Types: Cigarettes   Smokeless tobacco: Never  Vaping Use   Vaping status: Never Used  Substance and Sexual Activity   Alcohol use: Not Currently    Alcohol/week: 0.0 standard drinks of alcohol    Comment: not since confirmed pregnancy   Drug use: No   Sexual activity: Not Currently    Partners: Male    Birth control/protection: Implant  Other Topics Concern   Not on file  Social History Narrative   Not on file   Social Determinants of Health   Financial Resource Strain: Not on file  Food Insecurity: Not on file  Transportation Needs: Not on file  Physical Activity: Not on file  Stress: Not on file  Social Connections: Not on file  Intimate Partner Violence: Not on file   Family History  Problem Relation Age of Onset   Hypertension Mother    Arthritis Mother    Depression Mother    Diabetes Father    Diabetes Maternal Grandmother  Cancer Maternal Grandmother    Hypertension Maternal Grandmother    Kidney disease Maternal Grandmother    Asthma Brother    ADD / ADHD Maternal Uncle    Diabetes Maternal Uncle    Hypertension Maternal Uncle    No Known Allergies    Patient Care Team: Elenore Paddy, NP as PCP - General (Nurse Practitioner) Sallye Lat, MD as Consulting Physician (Ophthalmology)   Outpatient Medications Prior to Visit  Medication Sig   acetaminophen (TYLENOL) 500 MG tablet Take 2 tablets (1,000 mg total) by mouth every 6 (six) hours.   Etonogestrel (NEXPLANON [place]) Inject into the skin.   Iron, Ferrous Sulfate, 325 (65 Fe) MG TABS Take 325 mg by mouth daily.   NIFEdipine (PROCARDIA XL) 30 MG 24 hr tablet Take 1 tablet (30 mg total) by mouth daily.   No facility-administered medications prior to visit.     Review of Systems  Constitutional:  Negative for fever and malaise/fatigue.  HENT:  Negative for hearing loss and tinnitus.   Eyes:  Positive for blurred vision. Negative for double vision.  Respiratory:  Negative for shortness of breath and wheezing.   Cardiovascular:  Negative for chest pain and palpitations.  Gastrointestinal:  Negative for abdominal pain and blood in stool.  Genitourinary:  Negative for dysuria and hematuria.  Skin:  Negative for itching and rash.  Neurological:  Negative for dizziness, loss of consciousness and headaches.  Psychiatric/Behavioral:  Negative for depression and suicidal ideas. The patient is not nervous/anxious.           Objective:     BP 122/80   Pulse 86   Temp 98.1 F (36.7 C) (Temporal)   Ht 5\' 7"  (1.702 m)   Wt (!) 312 lb 8 oz (141.7 kg)   LMP 03/24/2023   BMI 48.94 kg/m  BP Readings from Last 3 Encounters:  04/06/23 122/80  09/16/22 114/84  02/25/22 122/84   Wt Readings from Last 3 Encounters:  04/06/23 (!) 312 lb 8 oz (141.7 kg)  09/16/22 (!) 309 lb 6 oz (140.3 kg)  02/25/22 (!) 303 lb 2 oz (137.5 kg)        04/06/2023    1:21 PM 09/16/2022    1:58 PM 02/25/2022    3:27 PM  PHQ9 SCORE ONLY  PHQ-9 Total Score 0 0 0     Physical Exam Vitals reviewed. Exam conducted with a chaperone present.  Constitutional:      Appearance: Normal appearance. She is obese.  HENT:     Head: Normocephalic and atraumatic.     Right Ear: Tympanic membrane, ear canal and external ear normal.     Left Ear: Tympanic membrane, ear canal and external ear normal.  Eyes:     General:        Right eye: No discharge.        Left eye: No discharge.     Extraocular Movements: Extraocular movements intact.     Conjunctiva/sclera: Conjunctivae normal.     Pupils: Pupils are equal, round, and reactive to light.  Neck:     Vascular: No carotid bruit.  Cardiovascular:     Rate and Rhythm: Normal rate and regular rhythm.     Pulses: Normal  pulses.     Heart sounds: Normal heart sounds. No murmur heard. Pulmonary:     Effort: Pulmonary effort is normal.     Breath sounds: Normal breath sounds.  Chest:  Breasts:    Breasts are symmetrical.  Right: Normal.     Left: Normal.  Abdominal:     General: Abdomen is flat. Bowel sounds are normal. There is no distension.     Palpations: Abdomen is soft. There is no mass.     Tenderness: There is no abdominal tenderness.  Musculoskeletal:        General: No tenderness.     Cervical back: Neck supple. No muscular tenderness.     Right lower leg: No edema.     Left lower leg: No edema.  Lymphadenopathy:     Cervical: No cervical adenopathy.     Upper Body:     Right upper body: No supraclavicular adenopathy.     Left upper body: No supraclavicular adenopathy.  Skin:    General: Skin is warm and dry.  Neurological:     General: No focal deficit present.     Mental Status: She is alert and oriented to person, place, and time.     Motor: No weakness.     Gait: Gait normal.  Psychiatric:        Mood and Affect: Mood normal.        Behavior: Behavior normal.        Judgment: Judgment normal.      No results found for any visits on 04/06/23.     Assessment & Plan:    Routine Health Maintenance and Physical Exam  Immunization History  Administered Date(s) Administered   DTP 06/21/1991, 08/16/1991, 11/26/1991, 05/08/1992, 03/07/1995   HIB (PRP-OMP) 06/21/1991, 08/16/1991, 11/26/1991, 05/08/1992   Hepatitis B 07/31/1991, 10/02/1991, 05/08/1992   MMR 05/08/1992, 03/07/1995   Meningococcal polysaccharide vaccine (MPSV4) 01/30/2006   OPV 06/21/1991, 08/16/1991, 05/08/1992, 03/07/1995   PPD Test 08/17/2015   Td 01/30/2006   Tdap 03/21/2017, 04/28/2020, 04/06/2021    Health Maintenance  Topic Date Due   INFLUENZA VACCINE  10/02/2023 (Originally 02/02/2023)   Cervical Cancer Screening (HPV/Pap Cotest)  12/17/2023   DTaP/Tdap/Td (10 - Td or Tdap) 04/07/2031    Hepatitis C Screening  Completed   HIV Screening  Completed   HPV VACCINES  Aged Out   COVID-19 Vaccine  Discontinued    Discussed health benefits of physical activity, and encouraged her to engage in regular exercise appropriate for her age and condition.  Problem List Items Addressed This Visit       Other   Class 3 severe obesity without serious comorbidity with body mass index (BMI) of 45.0 to 49.9 in adult [facility])    Chronic Discussed healthy lifestyle including diet and exercise aimed at steady weight loss.  Patient reports understanding.      Encounter for general adult medical examination with abnormal findings    Discussed routine health screenings, healthy lifestyle, and immunizations.  Handout provided.  Patient has declined flu shot today.      Iron deficiency anemia - Primary    Chronic Continues on iron supplement Labs ordered today further recommendations may be made based upon his results.      Relevant Orders   CBC   Iron   Ferritin   Basic metabolic panel   Return in about 1 year (around 04/05/2024) for CPE with Hashem Goynes.     Elenore Paddy, NP

## 2023-05-28 IMAGING — US US MFM OB FOLLOW-UP
1 series · 13 of 28 positions shown · non-contrast
Comparison: none

[Series 1: us mfm ob follow-up · 13 of 51 slices shown]
[im 2/51]
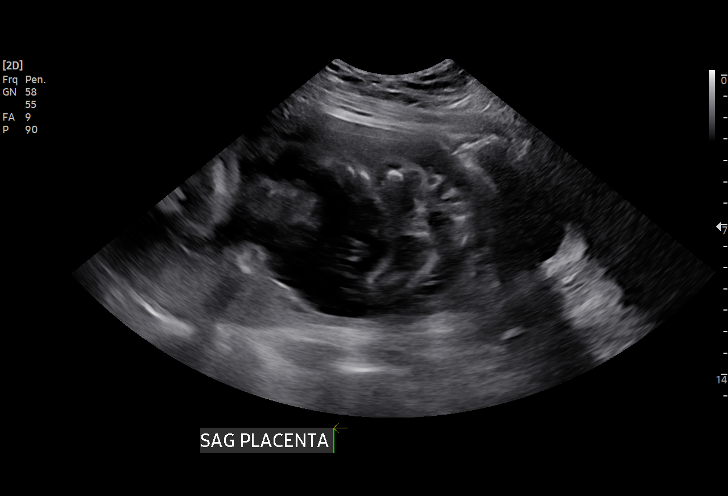
[im 6/51]
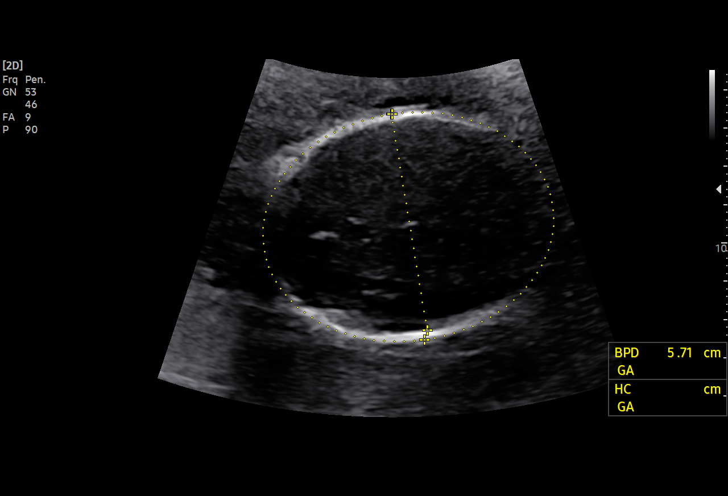
[im 10/51]
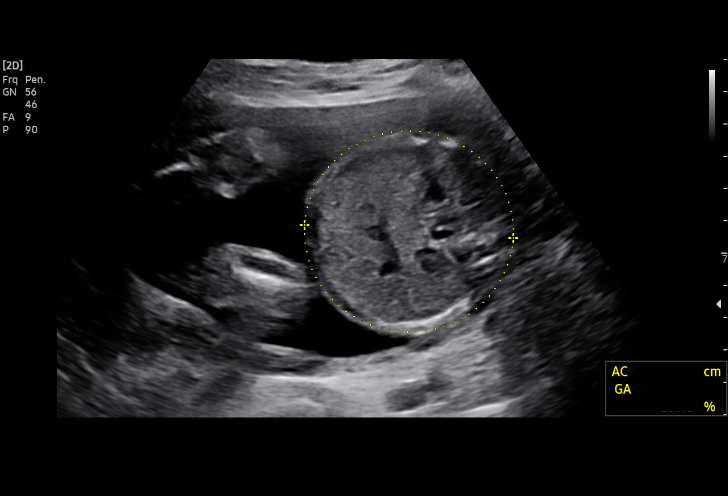
[im 13/51]
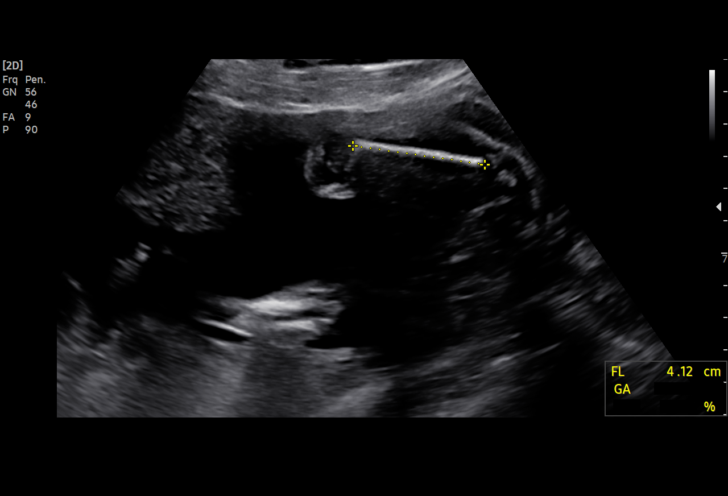
[im 17/51]
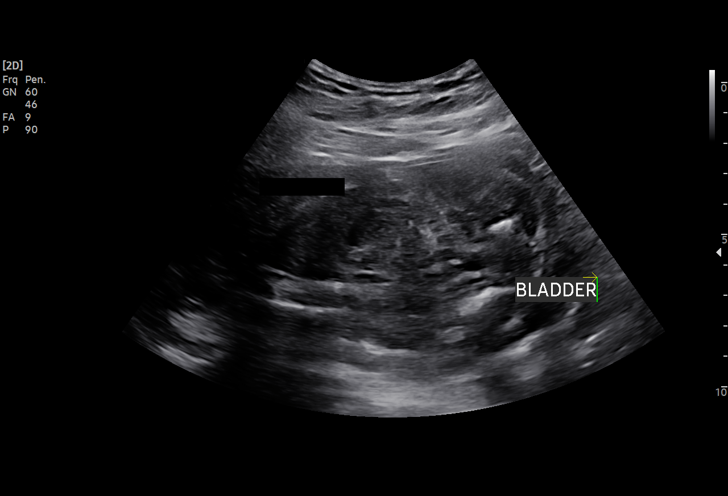
[im 21/51]
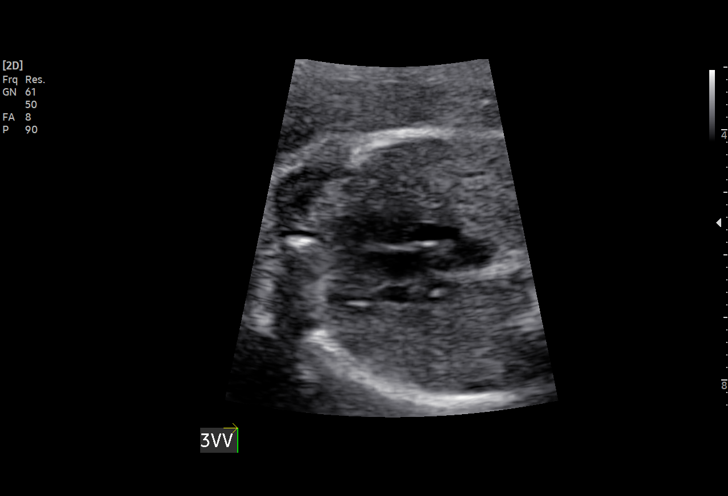
[im 26/51]
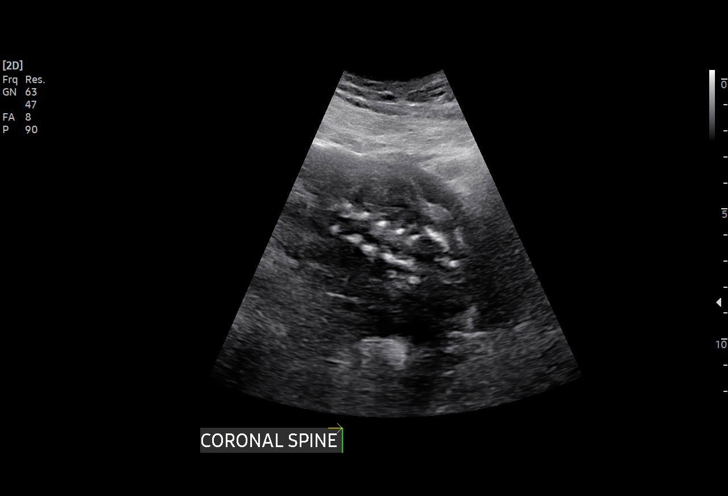
[im 30/51]
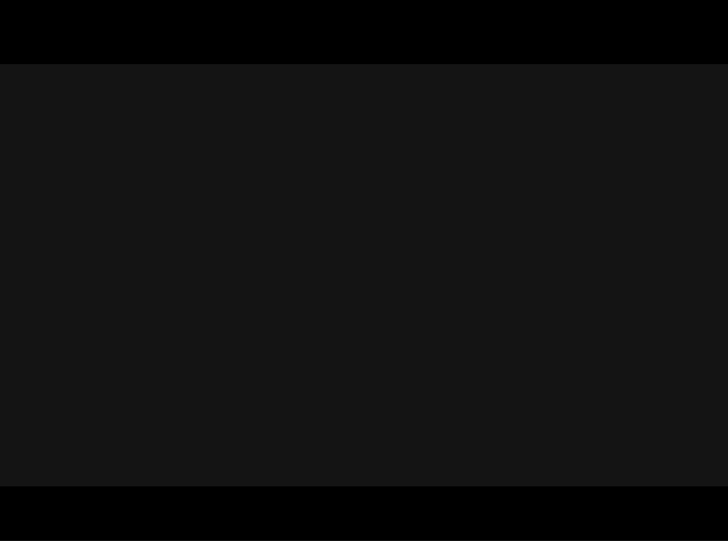
[im 34/51]
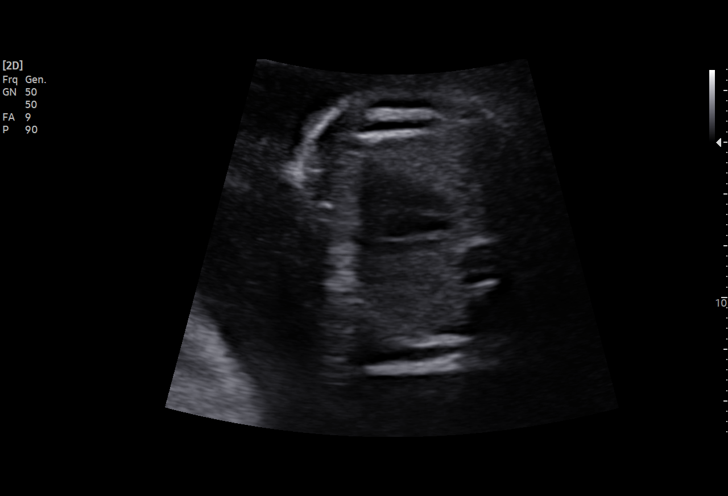
[im 38/51]
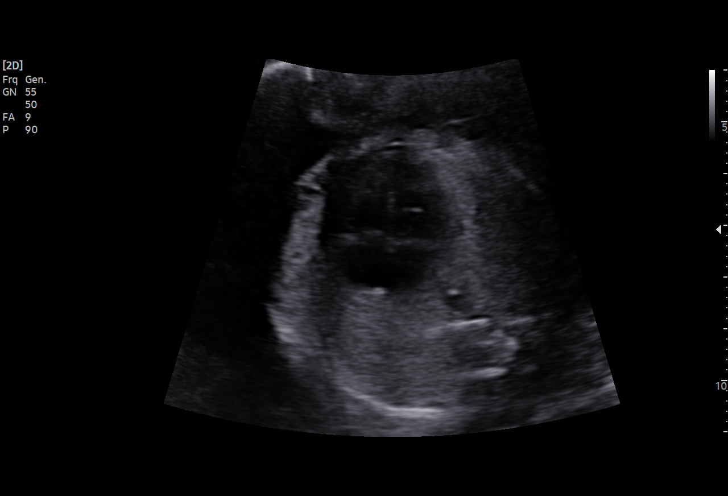
[im 41/51]
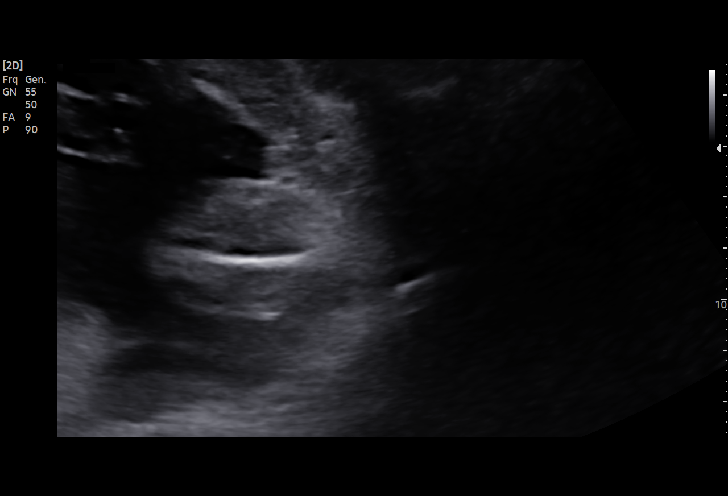
[im 45/51]
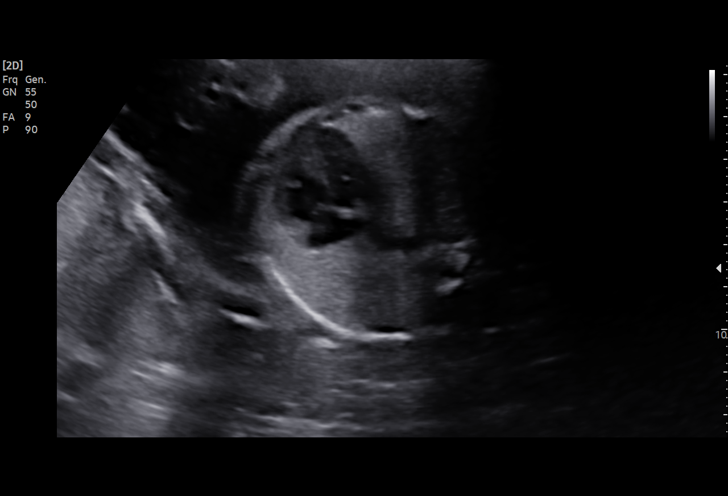
[im 49/51]
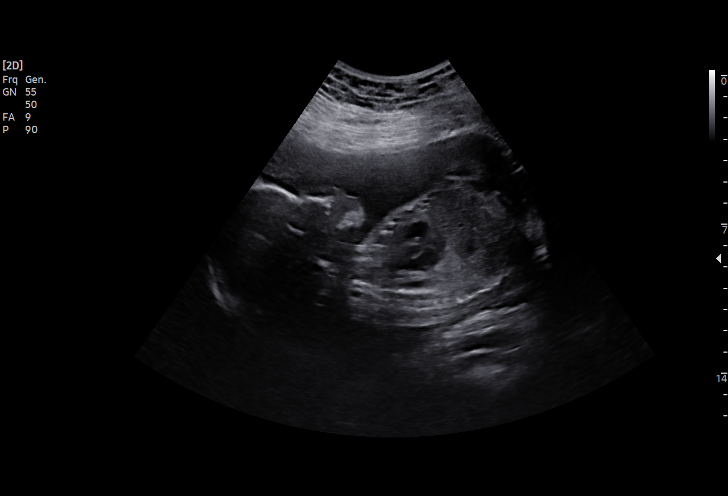

[13 of 28 positions shown; findings below may reference images not displayed]

RICHARD OSWALDO

Indications

 Obesity complicating pregnancy, second
 trimester (BMI 43)
 Short interval between pregancies, 2nd
 trimester ([DATE])
 Poor obstetric history: Previous
 preeclampsia / eclampsia/gestational HTN
 History of cesarean delivery, currently
 pregnant
 Encounter for other antenatal screening
 follow-up
 24 weeks gestation of pregnancy
Fetal Evaluation

 Num Of Fetuses:         1
 Cardiac Activity:       Observed
 Presentation:           Breech
 Placenta:               Posterior Fundal
 P. Cord Insertion:      Not well visualized

 Amniotic Fluid
 AFI FV:      Within normal limits

                             Largest Pocket(cm)

Biometry

 BPD:      57.1  mm     G. Age:  23w 3d         20  %    CI:        71.12   %    70 - 86
                                                         FL/HC:      19.3   %    18.7 -
 HC:      215.7  mm     G. Age:  23w 4d         15  %    HC/AC:      1.07        1.05 -
 AC:      201.5  mm     G. Age:  24w 5d         62  %    FL/BPD:     72.9   %    71 - 87
 FL:       41.6  mm     G. Age:  23w 4d         20  %    FL/AC:      20.6   %    20 - 24

 Est. FW:     664  gm      1 lb 7 oz     40  %
OB History

 Gravidity:    2         Term:   1        Prem:   0        SAB:   0
 TOP:          0       Ectopic:  0        Living: 1
Gestational Age

 U/S Today:     23w 6d                                        EDD:   07/08/21
 Best:          24w 1d     Det. By:  Previous Ultrasound      EDD:   07/06/21
                                     (11/29/20)
Anatomy

 Cranium:               Appears normal         Aortic Arch:            Not well visualized
 Cavum:                 Appears normal         Ductal Arch:            Not well visualized
 Ventricles:            Previously seen        Diaphragm:              Previously seen
 Choroid Plexus:        Previously seen        Stomach:                Appears normal, left
                                                                       sided
 Cerebellum:            Previously seen        Abdomen:                Appears normal
 Posterior Fossa:       Previously seen        Abdominal Wall:         Appears nml (cord
                                                                       insert, abd wall)
 Nuchal Fold:           Not applicable (>20    Cord Vessels:           Appears normal (3
                        wks GA)                                        vessel cord)
 Face:                  Orbits and profile     Kidneys:                Appear normal
                        previously seen
 Lips:                  Previously seen        Bladder:                Appears normal
 Heart:                 Appears normal; EIF    Spine:                  Not well visualized
 RVOT:                  Appears normal         Upper Extremities:      Previously seen
 LVOT:                  Appears normal         Lower Extremities:      Previously seen

 Other:  Female gender previously seen. 3VV and 3VTV visualized.
         Technically difficult due to maternal habitus.
Cervix Uterus Adnexa

 Cervix
 Length:           3.33  cm.
 Normal appearance by transabdominal scan.
Comments

 This patient was seen for a follow up growth scan due to
 maternal obesity with a BMI of 44.  The views of the fetal
 anatomy were suboptimal during her prior exam.  She denies
 any problems since her last exam.
 She was informed that the fetal growth and amniotic fluid
 level appears appropriate for her gestational age.
 On today's exam, an intracardiac echogenic focus was noted
 in the left ventricle of the fetal heart.  The small association
 between an echogenic focus and Down syndrome was
 discussed.
 Due to the echogenic focus noted today, the patient was
 offered and declined an amniocentesis today for definitive
 diagnosis of fetal aneuploidy.  She reports that she is
 comfortable with her negative cell free DNA test.
 The views of the fetal anatomy remain suboptimal today due
 to the fetal position and maternal body habitus.
 A follow up exam was scheduled in 4 weeks to assess the
 fetal growth and to try to complete the views of the fetal
 anatomy.

## 2023-05-30 ENCOUNTER — Ambulatory Visit: Payer: Medicaid Other | Admitting: Family Medicine

## 2023-07-11 ENCOUNTER — Ambulatory Visit (INDEPENDENT_AMBULATORY_CARE_PROVIDER_SITE_OTHER): Payer: Medicaid Other | Admitting: Family Medicine

## 2023-07-11 VITALS — BP 138/92 | HR 93 | Temp 99.0°F | Wt 315.0 lb

## 2023-07-11 DIAGNOSIS — Z3046 Encounter for surveillance of implantable subdermal contraceptive: Secondary | ICD-10-CM

## 2023-07-11 DIAGNOSIS — Z30013 Encounter for initial prescription of injectable contraceptive: Secondary | ICD-10-CM | POA: Diagnosis not present

## 2023-07-11 DIAGNOSIS — N921 Excessive and frequent menstruation with irregular cycle: Secondary | ICD-10-CM | POA: Diagnosis not present

## 2023-07-11 MED ORDER — MEDROXYPROGESTERONE ACETATE 150 MG/ML IM SUSP
150.0000 mg | Freq: Once | INTRAMUSCULAR | Status: AC
Start: 2023-07-11 — End: 2023-07-11
  Administered 2023-07-11: 150 mg via INTRAMUSCULAR

## 2023-07-11 NOTE — Progress Notes (Signed)
 Pt is in office for Nexplanon  removal, pt is interested in Depo.  Pt state she has been having multiple cycles per month.  Depo given from office stock today.  Pt tolerated injection well.  Administrations This Visit     medroxyPROGESTERone  (DEPO-PROVERA ) injection 150 mg     Admin Date 07/11/2023 Action Given Dose 150 mg Route Intramuscular Documented By Jerrye Elvie BIRCH, CMA

## 2023-07-12 DIAGNOSIS — Z3046 Encounter for surveillance of implantable subdermal contraceptive: Secondary | ICD-10-CM | POA: Insufficient documentation

## 2023-07-12 NOTE — Assessment & Plan Note (Signed)
 Patient presenting for Nexplanon removal.  Tolerated procedure well.  Will get Depo-Provera for contraceptive management at this time and discussed establishing care when she decides she wants to get pregnant.  No further questions or concerns.

## 2023-07-12 NOTE — Progress Notes (Signed)
    SUBJECTIVE:   CHIEF COMPLAINT / HPI:   Nexplanon  removal Patient presenting for evaluation for Nexplanon  removal.  Reports that she has been having considerable bleeding with periods and spotting in between periods.  She is considering getting pregnant in the next 3 to 6 months so is interested in some other form of birth control such as Depo until then.  No other questions or concerns.   OBJECTIVE:   BP (!) 138/92   Pulse 93   Temp 99 F (37.2 C)   Wt (!) 315 lb (142.9 kg)   BMI 49.34 kg/m   General: Well-appearing 33 year old female in no acute distress Cardiac: Regular rate Respiratory: Breathing, speaking full sentences Abdomen: Soft, nontender MSK: No gross abnormalities   Nexplanon  Removal Patient identified, informed consent performed, consent signed.   Appropriate time out taken. Nexplanon  site identified.  Area prepped in usual sterile fashon. One ml of 1% lidocaine  was used to anesthetize the area at the distal end of the implant. A small stab incision was made right beside the implant on the distal portion.  The Nexplanon  rod was grasped using hemostats and removed without difficulty.  There was minimal blood loss. There were no complications.  3 ml of 1% lidocaine  was injected around the incision for post-procedure analgesia.  Steri-strips were applied over the small incision.  A pressure bandage was applied to reduce any bruising.  The patient tolerated the procedure well and was given post procedure instructions.  Patient is planning to use Depo for contraception/attempt conception.  ASSESSMENT/PLAN:   Encounter for Nexplanon  removal Patient presenting for Nexplanon  removal.  Tolerated procedure well.  Will get Depo-Provera  for contraceptive management at this time and discussed establishing care when she decides she wants to get pregnant.  No further questions or concerns.     Steffan Rover, MD Attending Family Medicine Physician, [facility] for  [facility], [facility]

## 2023-08-21 IMAGING — US US MFM OB FOLLOW-UP
1 series · 14 of 28 positions shown · non-contrast
Comparison: none

[Series 1: us mfm ob follow-up · 43 acquisitions, 14 frames shown]
[im 2/43]
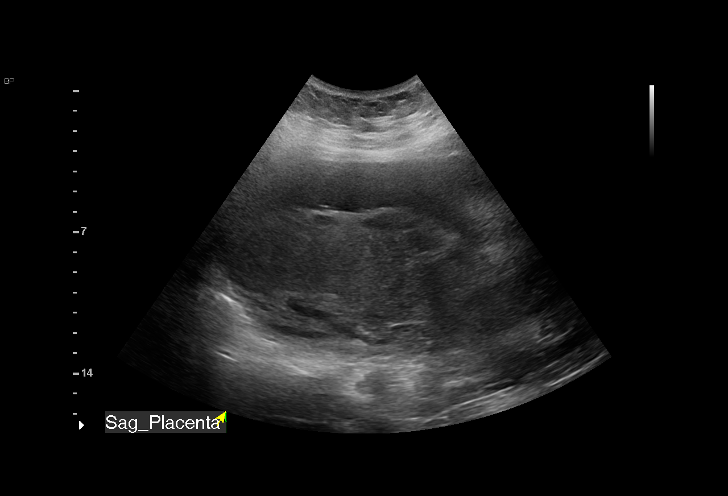
[im 5/43]
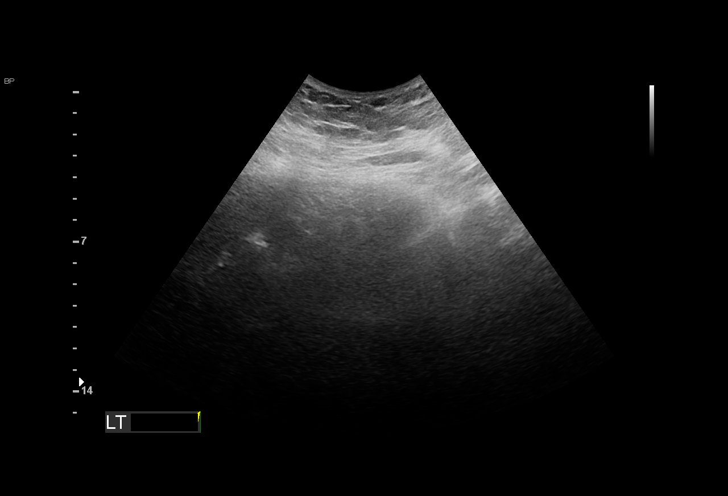
[im 8/43]
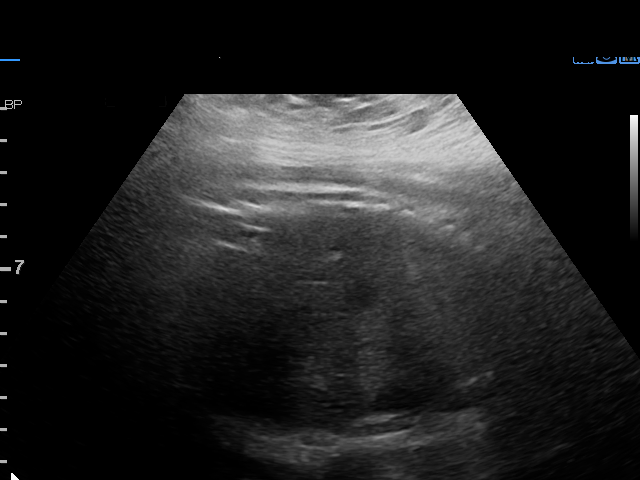
[im 11/43]
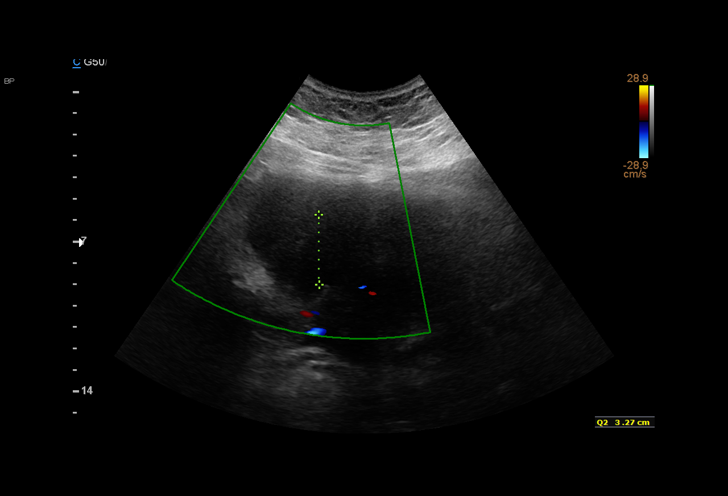
[im 15/43]
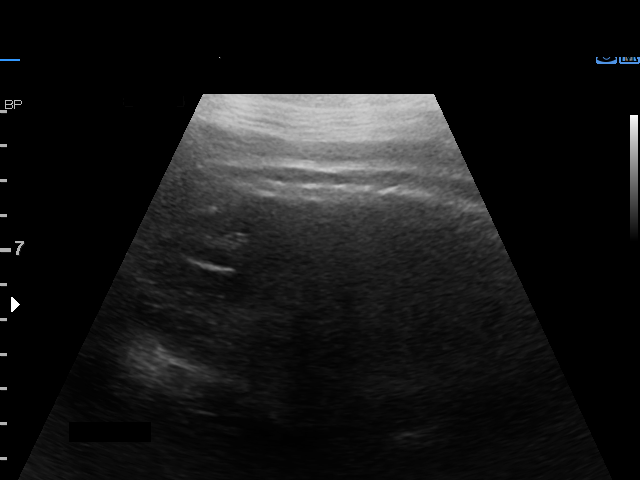
[im 18/43]
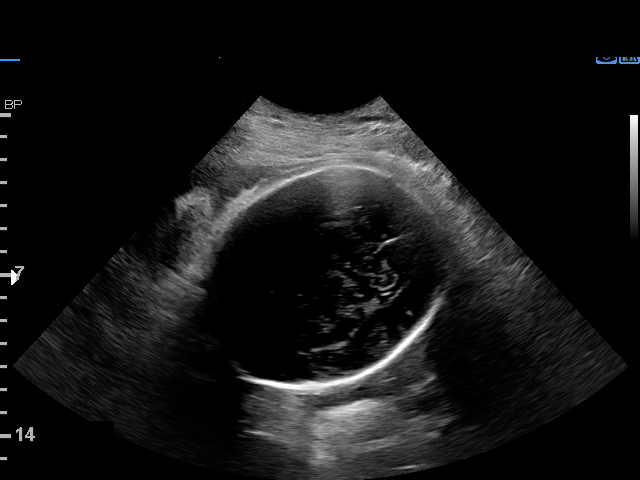
[im 21/43]
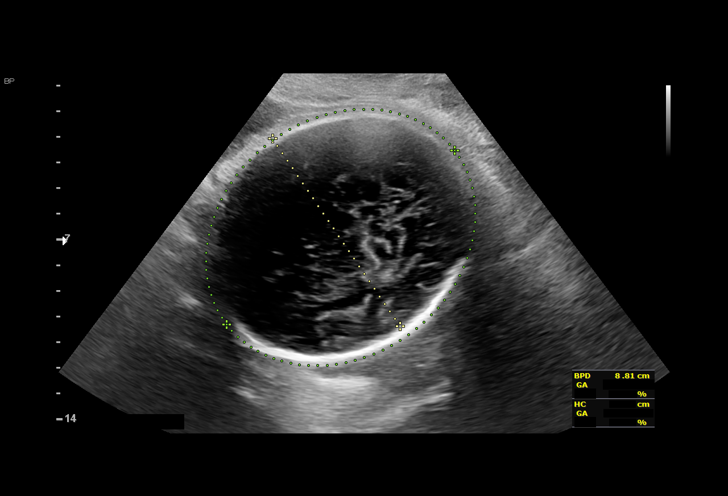
[im 24/43]
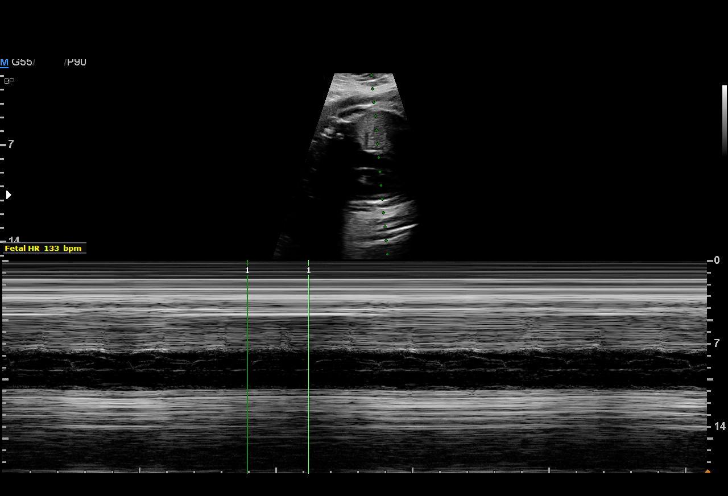
[im 27/43]
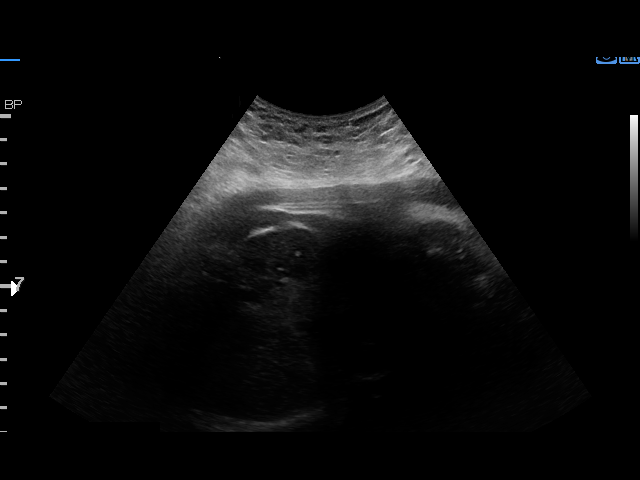
[im 30/43]
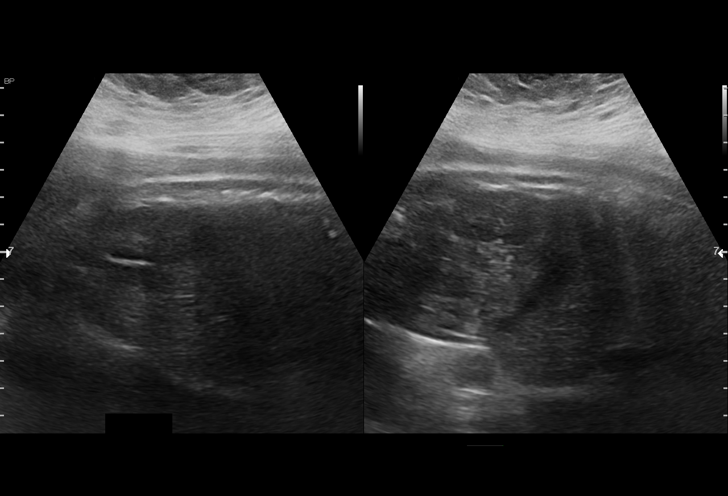
[im 33/43]
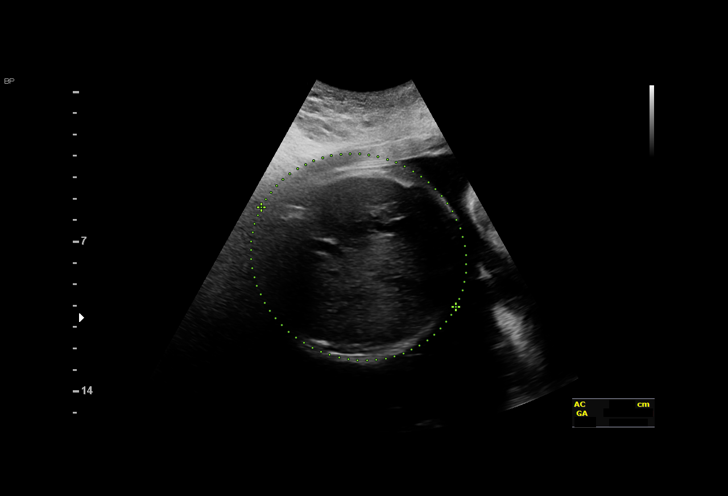
[im 36/43]
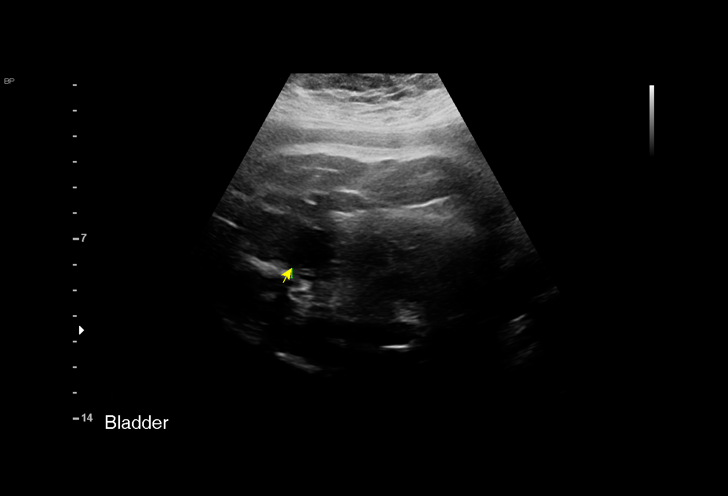
[im 39/43]
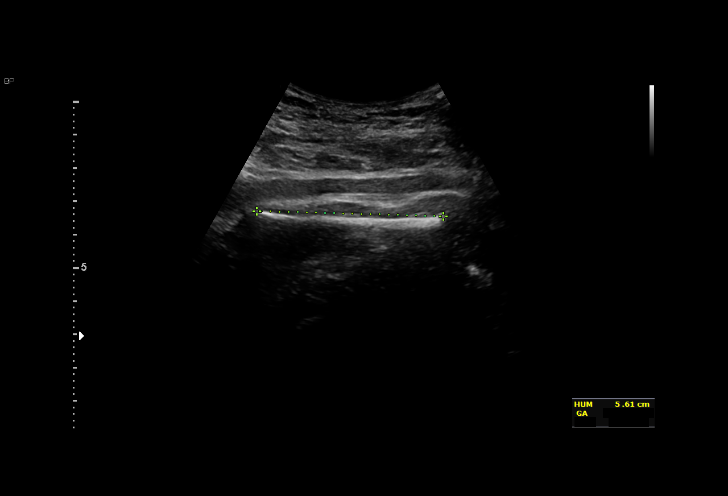
[im 43/43]
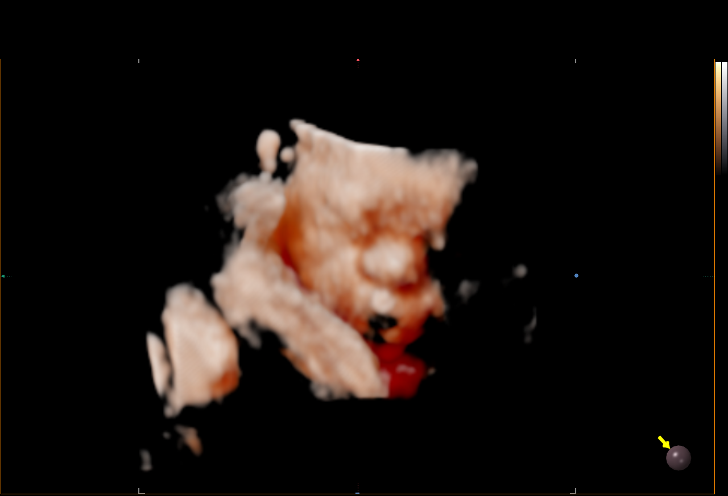

[14 of 28 positions shown; findings below may reference images not displayed]

Indications

 Obesity complicating pregnancy, third
 trimester (BMI 43)
 Poor obstetric history: Previous
 preeclampsia / eclampsia/gestational HTN
 Short interval between pregancies, 3rd
 trimester ([DATE])
 History of cesarean delivery, currently
 pregnant
 36 weeks gestation of pregnancy
Fetal Evaluation

 Num Of Fetuses:         1
 Fetal Heart Rate(bpm):  133
 Cardiac Activity:       Observed
 Presentation:           Cephalic
 Placenta:               Posterior Fundal
 P. Cord Insertion:      Previously Visualized

 Amniotic Fluid
 AFI FV:      Within normal limits

 AFI Sum(cm)     %Tile       Largest Pocket(cm)
 11.56           34
 RUQ(cm)       RLQ(cm)       LUQ(cm)        LLQ(cm)

Biophysical Evaluation

 Amniotic F.V:   Pocket => 2 cm             F. Tone:        Observed
 F. Movement:    Observed                   Score:          [DATE]
 F. Breathing:   Observed
Biometry

 BPD:      88.4  mm     G. Age:  35w 5d         44  %    CI:        75.75   %    70 - 86
                                                         FL/HC:      20.9   %    20.1 -
 HC:       322   mm     G. Age:  36w 3d         23  %    HC/AC:      1.04        0.93 -
 AC:      309.7  mm     G. Age:  34w 6d         23  %    FL/BPD:     76.2   %    71 - 87
 FL:       67.4  mm     G. Age:  34w 5d         11  %    FL/AC:      21.8   %    20 - 24
 HUM:      56.2  mm     G. Age:  32w 5d        < 5  %

 LV:        4.2  mm
 Est. FW:    5502  gm    5 lb 11 oz      21  %
OB History

 Gravidity:    2         Term:   1        Prem:   0        SAB:   0
 TOP:          0       Ectopic:  0        Living: 1
Gestational Age

 U/S Today:     35w 3d                                        EDD:   07/12/21
 Best:          36w 2d     Det. By:  Previous Ultrasound      EDD:   07/06/21
                                     (11/29/20)
Anatomy

 Cranium:               Appears normal         Aortic Arch:            Not well visualized
 Cavum:                 Appears normal         Ductal Arch:            Not well visualized
 Ventricles:            Appears normal         Diaphragm:              Appears normal
 Choroid Plexus:        Previously seen        Stomach:                Appears normal, left
                                                                       sided
 Cerebellum:            Previously seen        Abdomen:                Appears normal
 Posterior Fossa:       Previously seen        Abdominal Wall:         Previously seen
 Nuchal Fold:           Not applicable (>20    Cord Vessels:           Previously seen
                        wks GA)
 Face:                  Orbits and profile     Kidneys:                Appear normal
                        previously seen
 Lips:                  Previously seen        Bladder:                Appears normal
 Thoracic:              Appears normal         Spine:                  Previously seen
 Heart:                 Appears normal         Upper Extremities:      Previously seen
                        (4CH, axis, and
                        situs)
 RVOT:                  Previously seen        Lower Extremities:      Previously seen
 LVOT:                  Previously seen

 Other:  Female gender previously seen. 3VV and 3VTV prev visualized.
         Technically difficult due to maternal habitus.
Cervix Uterus Adnexa
 Cervix
 Not visualized (advanced GA >91wks)

 Uterus
 Normal shape and size.

 Right Ovary
 Not visualized.

 Left Ovary
 Not visualized.

 Cul De Sac
 No free fluid seen.

 Adnexa
 No adnexal mass visualized.
Comments

 This patient was seen for a follow up growth scan and BPP
 due to maternal obesity with a BMI of 44.  She denies any
 problems since her last exam.
 She was informed that the fetal growth and amniotic fluid
 level appears appropriate for her gestational age.
 A BPP performed today was [DATE].
 Due to maternal obesity, we will continue to follow her with
 weekly fetal testing.
 Another BPP was scheduled in 1 week.

## 2023-10-02 ENCOUNTER — Ambulatory Visit: Payer: Medicaid Other

## 2024-01-01 ENCOUNTER — Encounter: Payer: Self-pay | Admitting: Obstetrics and Gynecology

## 2024-01-01 ENCOUNTER — Ambulatory Visit: Admitting: Obstetrics and Gynecology

## 2024-01-01 VITALS — BP 149/94 | HR 86 | Ht 67.0 in | Wt 341.0 lb

## 2024-01-01 DIAGNOSIS — N939 Abnormal uterine and vaginal bleeding, unspecified: Secondary | ICD-10-CM

## 2024-01-01 MED ORDER — MEDROXYPROGESTERONE ACETATE 10 MG PO TABS: 10.0000 mg | ORAL_TABLET | Freq: Every day | ORAL | 0 refills | Status: DC

## 2024-01-01 MED ORDER — PRENATAL 28-0.8 MG PO TABS: 1.0000 | ORAL_TABLET | Freq: Every day | ORAL | 12 refills | Status: AC

## 2024-01-01 NOTE — Progress Notes (Signed)
   GYNECOLOGY PROGRESS NOTE  History:  33 y.o. [id number] presents to [facility] for irregular period after stopping depo. She had a nexplanon  removed on 07/11/23 and depo injection administered that day. Reports on and off cycle. Will stop and come back next day will bleed for week and a half and then stop and then two days later start  Currently on cycle again. Desires to conceive   The following portions of the patient's history were reviewed and updated as appropriate: allergies, current medications, past family history, past medical history, past social history, past surgical history and problem list. Last pap smear on 12/16/20 was normal,  Health Maintenance Due  Topic Date Due   HPV VACCINES (1 - 3-dose SCDM series) Never done   Cervical Cancer Screening (HPV/Pap Cotest)  12/17/2023     Review of Systems:  Pertinent items are noted in HPI.   Objective:  Physical Exam There were no vitals taken for this visit. VS reviewed, nursing note reviewed,  Constitutional: well developed, well nourished, no distress HEENT: normocephalic Pulm/chest wall: normal effort Breast Exam: deferred Abdomen: soft Neuro: alert and oriented  Skin: warm, dry Psych: affect normal Pelvic exam: declined  Assessment & Plan:  1. Abnormal uterine bleeding (AUB) (Primary) Discussed abnormal bleeding when stopping depo provera . Discussed waiting for normal periods to return, birth control to regulate and provera .Desires to conceive, will hold off on bc.  Will trial provera  to stop the daily bleeding and more time to regulate cycle after depo. Discussed opk when regular cycle returns or follow up if aub continues. Discussed starting PNV, rx sent to pharmacy  Is scheduled with pcp for pap   Nidia Daring, FNP

## 2024-01-01 NOTE — Progress Notes (Signed)
 Pt reports irregular bleeding with clots for the last month. Last Depo injection January 2025 Pt wants to conceive  Last PAP 12-16-20. Scheduled with PCP

## 2024-04-05 ENCOUNTER — Encounter: Payer: Medicaid Other | Admitting: Nurse Practitioner

## 2024-05-23 ENCOUNTER — Ambulatory Visit: Admitting: Nurse Practitioner

## 2024-05-23 VITALS — BP 146/90 | HR 92 | Temp 97.6°F | Ht 67.0 in | Wt 325.1 lb

## 2024-05-23 DIAGNOSIS — Z8759 Personal history of other complications of pregnancy, childbirth and the puerperium: Secondary | ICD-10-CM | POA: Diagnosis not present

## 2024-05-23 DIAGNOSIS — Z6841 Body Mass Index (BMI) 40.0 and over, adult: Secondary | ICD-10-CM

## 2024-05-23 DIAGNOSIS — I10 Essential (primary) hypertension: Secondary | ICD-10-CM

## 2024-05-23 DIAGNOSIS — D509 Iron deficiency anemia, unspecified: Secondary | ICD-10-CM

## 2024-05-23 DIAGNOSIS — Z0000 Encounter for general adult medical examination without abnormal findings: Secondary | ICD-10-CM | POA: Diagnosis not present

## 2024-05-23 DIAGNOSIS — Z124 Encounter for screening for malignant neoplasm of cervix: Secondary | ICD-10-CM | POA: Insufficient documentation

## 2024-05-23 DIAGNOSIS — Z0001 Encounter for general adult medical examination with abnormal findings: Secondary | ICD-10-CM

## 2024-05-23 DIAGNOSIS — E66813 Obesity, class 3: Secondary | ICD-10-CM | POA: Diagnosis not present

## 2024-05-23 LAB — CBC
HCT: 35.2 % — ABNORMAL LOW (ref 36.0–46.0)
Hemoglobin: 11.6 g/dL — ABNORMAL LOW (ref 12.0–15.0)
MCHC: 32.9 g/dL (ref 30.0–36.0)
MCV: 89.1 fl (ref 78.0–100.0)
Platelets: 265 K/uL (ref 150.0–400.0)
RBC: 3.96 Mil/uL (ref 3.87–5.11)
RDW: 14.8 % (ref 11.5–15.5)
WBC: 6.2 K/uL (ref 4.0–10.5)

## 2024-05-23 LAB — LIPID PANEL
Cholesterol: 140 mg/dL (ref 0–200)
HDL: 41.2 mg/dL (ref >39.00)
LDL Cholesterol: 63 mg/dL (ref 0–99)
NonHDL: 99.01
Total CHOL/HDL Ratio: 3
Triglycerides: 182 mg/dL — ABNORMAL HIGH (ref 0.0–149.0)
VLDL: 36.4 mg/dL (ref 0.0–40.0)

## 2024-05-23 LAB — IRON: Iron: 37 ug/dL — ABNORMAL LOW (ref 42–145)

## 2024-05-23 LAB — COMPREHENSIVE METABOLIC PANEL WITH GFR
ALT: 15 U/L (ref 0–35)
AST: 16 U/L (ref 0–37)
Albumin: 4 g/dL (ref 3.5–5.2)
Alkaline Phosphatase: 67 U/L (ref 39–117)
BUN: 8 mg/dL (ref 6–23)
CO2: 31 meq/L (ref 19–32)
Calcium: 9.3 mg/dL (ref 8.4–10.5)
Chloride: 104 meq/L (ref 96–112)
Creatinine, Ser: 0.57 mg/dL (ref 0.40–1.20)
GFR: 119.57 mL/min (ref >60.00)
Glucose, Bld: 105 mg/dL — ABNORMAL HIGH (ref 70–99)
Potassium: 3.7 meq/L (ref 3.5–5.1)
Sodium: 139 meq/L (ref 135–145)
Total Bilirubin: 0.2 mg/dL (ref 0.2–1.2)
Total Protein: 7.8 g/dL (ref 6.0–8.3)

## 2024-05-23 LAB — FERRITIN: Ferritin: 16.4 ng/mL (ref 10.0–291.0)

## 2024-05-23 LAB — TSH: TSH: 1.24 u[IU]/mL (ref 0.35–5.50)

## 2024-05-23 MED ORDER — NIFEDIPINE ER OSMOTIC RELEASE 30 MG PO TB24: 30.0000 mg | ORAL_TABLET | Freq: Every day | ORAL | 0 refills | Status: AC

## 2024-05-23 NOTE — Assessment & Plan Note (Signed)
  Due for cervical cancer screening (Pap smear) Due for cervical cancer screening. Prefers to have the Pap smear performed at her previous provider's office. - Provided referral to OB GYN for Pap smear.

## 2024-05-23 NOTE — Progress Notes (Signed)
 Complete physical exam  Patient: Carmen Price   DOB: 05-12-1991   33 y.o. Female  MRN: [id number]  Subjective:    Chief Complaint  Patient presents with   Annual Exam    Carmen Price is a 33 y.o. female who presents today for a complete physical exam.   Discussed the use of AI scribe software for clinical note transcription with the patient, who gave verbal consent to proceed.  History of Present Illness Carmen Price is a 33 year old female who presents for an annual physical exam.  Hypertension - Blood pressure measured at 148/92 mmHg during today's visit - Previously treated with nifedipine  with good results, including during pregnancy - No chest pain, palpitations, or shortness of breath  Iron  deficiency anemia - Currently taking iron  supplementation - No stomach pain, blood in stool, or unexplained weight loss - No fever  Obesity - Morbid obesity present, BMI 50.91  Preventive care and immunizations - Declines influenza vaccination today - Tetanus vaccine is current, not due until 2032 - Due for cervical cancer screening and requests referral to OB GYN     Most recent fall risk assessment:    04/06/2023    1:21 PM  Fall Risk   Falls in the past year? 0  Number falls in past yr: 0  Injury with Fall? 0  Risk for fall due to : No Fall Risks  Follow up Falls evaluation completed     Most recent depression screenings:    04/06/2023    1:21 PM 09/16/2022    1:58 PM  PHQ 2/9 Scores  PHQ - 2 Score 0 0      Past Medical History:  Diagnosis Date   Anemia    Anxiety    Blood transfusion during current hospitalization 02/03/2015   Blood transfusion without reported diagnosis    Depression    GERD (gastroesophageal reflux disease)    Gestational hypertension 06/24/2021   Headache    Pregnancy induced hypertension    Previous cesarean delivery affecting pregnancy, antepartum 01/11/2021   Symptomatic anemia 02/03/2015   Vaginal Pap smear,  abnormal    Past Surgical History:  Procedure Laterality Date   CESAREAN SECTION N/A 06/05/2020   Procedure: CESAREAN SECTION;  Surgeon: Nicholaus Burnard HERO, MD;  Location: [facility];  Service: Obstetrics;  Laterality: N/A;   CESAREAN SECTION N/A 06/25/2021   Procedure: CESAREAN SECTION;  Surgeon: Cleatus Moccasin, MD;  Location: [facility];  Service: Obstetrics;  Laterality: N/A;   TONSILLECTOMY     Social History   Tobacco Use   Smoking status: Former    Current packs/day: 0.25    Average packs/day: 0.3 packs/day for 4.0 years (1.0 ttl pk-yrs)    Types: Cigarettes   Smokeless tobacco: Never  Vaping Use   Vaping status: Never Used  Substance Use Topics   Alcohol use: Not Currently    Alcohol/week: 0.0 standard drinks of alcohol    Comment: not since confirmed pregnancy   Drug use: No   Family History  Problem Relation Age of Onset   Hypertension Mother    Arthritis Mother    Depression Mother    Diabetes Father    Diabetes Maternal Grandmother    Cancer Maternal Grandmother    Hypertension Maternal Grandmother    Kidney disease Maternal Grandmother    Asthma Brother    ADD / ADHD Maternal Uncle    Diabetes Maternal Uncle    Hypertension Maternal Uncle  No Known Allergies    Patient Care Team: Elnor Lauraine BRAVO, NP as PCP - General (Nurse Practitioner) Octavia Bruckner, MD as Consulting Physician (Ophthalmology)   Outpatient Medications Prior to Visit  Medication Sig   Iron , Ferrous Sulfate , 325 (65 Fe) MG TABS Take 325 mg by mouth daily.   medroxyPROGESTERone  (PROVERA ) 10 MG tablet Take 1 tablet (10 mg total) by mouth daily.   Prenatal 28-0.8 MG TABS Take 1 tablet by mouth daily.   acetaminophen  (TYLENOL ) 500 MG tablet Take 2 tablets (1,000 mg total) by mouth every 6 (six) hours. (Patient not taking: Reported on 05/23/2024)   [DISCONTINUED] NIFEdipine  (PROCARDIA  XL) 30 MG 24 hr tablet Take 1 tablet (30 mg total) by mouth daily. (Patient not taking: Reported on 05/23/2024)    No facility-administered medications prior to visit.    Review of Systems  Constitutional:  Negative for fever and weight loss.  Respiratory:  Negative for cough and wheezing.   Cardiovascular:  Negative for chest pain and palpitations.  Gastrointestinal:  Negative for abdominal pain and blood in stool.  Genitourinary:  Negative for dysuria and hematuria.  Psychiatric/Behavioral:  Negative for depression. The patient is not nervous/anxious.           Objective:     BP (!) 146/90   Pulse 92   Temp 97.6 F (36.4 C) (Temporal)   Ht 5' 7 (1.702 m)   Wt (!) 325 lb 1 oz (147.4 kg)   LMP 05/23/2024   SpO2 97%   BMI 50.91 kg/m  BP Readings from Last 3 Encounters:  05/23/24 (!) 146/90  01/01/24 (!) 149/94  07/11/23 (!) 138/92   Wt Readings from Last 3 Encounters:  05/23/24 (!) 325 lb 1 oz (147.4 kg)  01/01/24 (!) 341 lb (154.7 kg)  07/11/23 (!) 315 lb (142.9 kg)      Physical Exam Vitals reviewed. Exam conducted with a chaperone present.  Constitutional:      Appearance: Normal appearance.  HENT:     Head: Normocephalic and atraumatic.     Right Ear: Tympanic membrane, ear canal and external ear normal.     Left Ear: Tympanic membrane, ear canal and external ear normal.  Eyes:     General:        Right eye: No discharge.        Left eye: No discharge.     Extraocular Movements: Extraocular movements intact.     Conjunctiva/sclera: Conjunctivae normal.     Pupils: Pupils are equal, round, and reactive to light.  Neck:     Vascular: No carotid bruit.  Cardiovascular:     Rate and Rhythm: Normal rate and regular rhythm.     Pulses: Normal pulses.     Heart sounds: Normal heart sounds. No murmur heard. Pulmonary:     Effort: Pulmonary effort is normal.     Breath sounds: Normal breath sounds.  Chest:  Breasts:    Breasts are symmetrical.     Right: Normal.     Left: Normal.  Abdominal:     General: Abdomen is flat. Bowel sounds are normal. There is no  distension.     Palpations: Abdomen is soft. There is no mass.     Tenderness: There is no abdominal tenderness.  Musculoskeletal:        General: No tenderness.     Cervical back: Neck supple. No muscular tenderness.     Right lower leg: No edema.     Left lower leg: No edema.  Lymphadenopathy:     Cervical: No cervical adenopathy.     Upper Body:     Right upper body: No supraclavicular adenopathy.     Left upper body: No supraclavicular adenopathy.  Skin:    General: Skin is warm and dry.  Neurological:     General: No focal deficit present.     Mental Status: She is alert and oriented to person, place, and time.     Motor: No weakness.     Gait: Gait normal.  Psychiatric:        Mood and Affect: Mood normal.        Behavior: Behavior normal.        Judgment: Judgment normal.      No results found for any visits on 05/23/24.     Assessment & Plan:    Routine Health Maintenance and Physical Exam  Immunization History  Administered Date(s) Administered   DTP 06/21/1991, 08/16/1991, 11/26/1991, 05/08/1992, 03/07/1995   HIB (PRP-OMP) 06/21/1991, 08/16/1991, 11/26/1991, 05/08/1992   Hepatitis B 07/31/1991, 10/02/1991, 05/08/1992   MMR 05/08/1992, 03/07/1995   Meningococcal polysaccharide vaccine (MPSV4) 01/30/2006   OPV 06/21/1991, 08/16/1991, 05/08/1992, 03/07/1995   PPD Test 08/17/2015   Td 01/30/2006   Tdap 03/21/2017, 04/28/2020, 04/06/2021    Health Maintenance  Topic Date Due   HPV VACCINES (1 - 3-dose SCDM series) Never done   Cervical Cancer Screening (HPV/Pap Cotest)  12/17/2023   Influenza Vaccine  10/01/2024 (Originally 02/02/2024)   DTaP/Tdap/Td (10 - Td or Tdap) 04/07/2031   Hepatitis B Vaccines 19-59 Average Risk  Completed   Hepatitis C Screening  Completed   HIV Screening  Completed   Pneumococcal Vaccine  Aged Out   Meningococcal B Vaccine  Aged Out   COVID-19 Vaccine  Discontinued    Discussed health benefits of physical activity, and  encouraged her to engage in regular exercise appropriate for her age and condition.  Problem List Items Addressed This Visit       Cardiovascular and Mediastinum   Hypertension    Essential hypertension Blood pressure remains elevated. Previously managed with nifedipine  during pregnancy. Discussed options for management, including home monitoring or restarting medication. She prefers to restart nifedipine   - Restarted nifedipine  (Procardia  XL) 30 mg oral daily. - Scheduled follow-up in one month to assess effectiveness of medication.      Relevant Medications   NIFEdipine  (PROCARDIA  XL) 30 MG 24 hr tablet   Other Relevant Orders   CBC   Comprehensive metabolic panel with GFR   Hemoglobin A1c   Lipid panel   TSH   Ferritin   Iron    hCG, serum, qualitative     Other   Class 3 severe obesity without serious comorbidity with body mass index (BMI) of 45.0 to 49.9 in adult [facility])   Class 3 severe obesity BMI indicates class 3 severe obesity. Discussed lifestyle modifications including calorie deficit diet and regular physical activity. Consideration of bariatric surgery if lifestyle changes are insufficient. She plans to attempt weight loss through lifestyle changes before considering surgery. - Encouraged calorie deficit diet and regular physical activity with a goal of 150 minutes per week. - Will consider referral to bariatric surgery team if lifestyle changes are insufficient.      Relevant Orders   CBC   Comprehensive metabolic panel with GFR   Hemoglobin A1c   Lipid panel   TSH   Ferritin   Iron    hCG, serum, qualitative   Encounter for general adult medical examination with  abnormal findings   Annual physical exam with abnormal findings Blood pressure elevated at 148/92 mmHg. Discussed potential risks of prolonged elevated blood pressure, including cardiovascular and cerebrovascular complications. - Ordered basic lab work. - Health maintenance and vaccine  recommendations discussed, handout provided      Relevant Orders   CBC   Comprehensive metabolic panel with GFR   Hemoglobin A1c   Lipid panel   TSH   Ferritin   Iron    hCG, serum, qualitative   Iron  deficiency anemia   Iron  deficiency anemia Currently on iron  supplementation. Plan to recheck iron  and ferritin levels to assess current status. - Ordered iron  and ferritin levels.      Relevant Orders   CBC   Comprehensive metabolic panel with GFR   Hemoglobin A1c   Lipid panel   TSH   Ferritin   Iron    hCG, serum, qualitative   Cervical cancer screening - Primary    Due for cervical cancer screening (Pap smear) Due for cervical cancer screening. Prefers to have the Pap smear performed at her previous provider's office. - Provided referral to OB GYN for Pap smear.      Relevant Orders   Ambulatory referral to Obstetrics / Gynecology   CBC   Comprehensive metabolic panel with GFR   Hemoglobin A1c   Lipid panel   TSH   Ferritin   Iron    hCG, serum, qualitative   Other Visit Diagnoses       H/O pre-eclampsia       Relevant Medications   NIFEdipine  (PROCARDIA  XL) 30 MG 24 hr tablet      Assessment and Plan Assessment & Plan Annual physical exam with abnormal findings Blood pressure elevated at 148/92 mmHg. Discussed potential risks of prolonged elevated blood pressure, including cardiovascular and cerebrovascular complications. - Ordered basic lab work. - Health maintenance and vaccine recommendations discussed, handout provided  Essential hypertension Blood pressure remains elevated. Previously managed with nifedipine  during pregnancy. Discussed options for management, including home monitoring or restarting medication. She prefers to restart nifedipine   - Restarted nifedipine  (Procardia  XL) 30 mg oral daily. - Scheduled follow-up in one month to assess effectiveness of medication.  Class 3 severe obesity BMI indicates class 3 severe obesity. Discussed  lifestyle modifications including calorie deficit diet and regular physical activity. Consideration of bariatric surgery if lifestyle changes are insufficient. She plans to attempt weight loss through lifestyle changes before considering surgery. - Encouraged calorie deficit diet and regular physical activity with a goal of 150 minutes per week. - Will consider referral to bariatric surgery team if lifestyle changes are insufficient.  Iron  deficiency anemia Currently on iron  supplementation. Plan to recheck iron  and ferritin levels to assess current status. - Ordered iron  and ferritin levels.  Due for cervical cancer screening (Pap smear) Due for cervical cancer screening. Prefers to have the Pap smear performed at her previous provider's office. - Provided referral to OB GYN for Pap smear.   In addition to completing annual physical exam I also completed an office visit as detailed above   Return in about 1 month (around 06/22/2024) for F/U with Lauraine, HTN.     Lauraine FORBES Pereyra, NP

## 2024-05-23 NOTE — Assessment & Plan Note (Signed)
  Essential hypertension Blood pressure remains elevated. Previously managed with nifedipine  during pregnancy. Discussed options for management, including home monitoring or restarting medication. She prefers to restart nifedipine   - Restarted nifedipine  (Procardia  XL) 30 mg oral daily. - Scheduled follow-up in one month to assess effectiveness of medication.

## 2024-05-23 NOTE — Assessment & Plan Note (Signed)
 Class 3 severe obesity BMI indicates class 3 severe obesity. Discussed lifestyle modifications including calorie deficit diet and regular physical activity. Consideration of bariatric surgery if lifestyle changes are insufficient. She plans to attempt weight loss through lifestyle changes before considering surgery. - Encouraged calorie deficit diet and regular physical activity with a goal of 150 minutes per week. - Will consider referral to bariatric surgery team if lifestyle changes are insufficient.

## 2024-05-23 NOTE — Assessment & Plan Note (Signed)
 Annual physical exam with abnormal findings Blood pressure elevated at 148/92 mmHg. Discussed potential risks of prolonged elevated blood pressure, including cardiovascular and cerebrovascular complications. - Ordered basic lab work. - Health maintenance and vaccine recommendations discussed, handout provided

## 2024-05-23 NOTE — Assessment & Plan Note (Signed)
 Iron  deficiency anemia Currently on iron  supplementation. Plan to recheck iron  and ferritin levels to assess current status. - Ordered iron  and ferritin levels.

## 2024-05-24 ENCOUNTER — Ambulatory Visit: Payer: Self-pay | Admitting: Nurse Practitioner

## 2024-05-24 LAB — HEMOGLOBIN A1C: Hgb A1c MFr Bld: 5.6 % (ref 4.6–6.5)

## 2024-05-24 LAB — HCG, SERUM, QUALITATIVE: Preg, Serum: NEGATIVE

## 2024-06-12 ENCOUNTER — Encounter (HOSPITAL_COMMUNITY): Payer: Self-pay

## 2024-06-12 ENCOUNTER — Ambulatory Visit (HOSPITAL_COMMUNITY): Admission: EM | Admit: 2024-06-12 | Discharge: 2024-06-12 | Disposition: A | Discharge status: Home / Self Care

## 2024-06-12 DIAGNOSIS — Z008 Encounter for other general examination: Secondary | ICD-10-CM | POA: Diagnosis not present
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
## 2024-06-24 ENCOUNTER — Ambulatory Visit: Admitting: Nurse Practitioner
